# Patient Record
Sex: Female | Born: 1985
Health system: Southern US, Community
[De-identification: ages and names within clinical notes are randomized; demographics above are authoritative.]

## PROBLEM LIST (undated history)

## (undated) ENCOUNTER — Inpatient Hospital Stay (HOSPITAL_COMMUNITY): Payer: Self-pay

## (undated) DIAGNOSIS — R51 Headache: Secondary | ICD-10-CM

## (undated) DIAGNOSIS — F319 Bipolar disorder, unspecified: Secondary | ICD-10-CM

## (undated) DIAGNOSIS — D649 Anemia, unspecified: Secondary | ICD-10-CM

## (undated) DIAGNOSIS — M419 Scoliosis, unspecified: Secondary | ICD-10-CM

## (undated) DIAGNOSIS — M797 Fibromyalgia: Secondary | ICD-10-CM

## (undated) DIAGNOSIS — F32A Depression, unspecified: Secondary | ICD-10-CM

## (undated) DIAGNOSIS — F329 Major depressive disorder, single episode, unspecified: Secondary | ICD-10-CM

## (undated) DIAGNOSIS — M549 Dorsalgia, unspecified: Secondary | ICD-10-CM

## (undated) DIAGNOSIS — M069 Rheumatoid arthritis, unspecified: Secondary | ICD-10-CM

## (undated) DIAGNOSIS — I10 Essential (primary) hypertension: Secondary | ICD-10-CM

## (undated) DIAGNOSIS — F419 Anxiety disorder, unspecified: Secondary | ICD-10-CM

## (undated) DIAGNOSIS — G8929 Other chronic pain: Secondary | ICD-10-CM

## (undated) DIAGNOSIS — M25579 Pain in unspecified ankle and joints of unspecified foot: Secondary | ICD-10-CM

## (undated) DIAGNOSIS — R519 Headache, unspecified: Secondary | ICD-10-CM

## (undated) DIAGNOSIS — M25569 Pain in unspecified knee: Secondary | ICD-10-CM

## (undated) HISTORY — DX: Fibromyalgia: M79.7

## (undated) HISTORY — DX: Rheumatoid arthritis, unspecified: M06.9

## (undated) HISTORY — PX: MOUTH SURGERY: SHX715

---
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
## 2006-01-25 ENCOUNTER — Other Ambulatory Visit: Admission: RE | Admit: 2006-01-25 | Discharge: 2006-01-25 | Payer: Self-pay | Admitting: Gynecology

## 2007-03-30 ENCOUNTER — Other Ambulatory Visit: Admission: RE | Admit: 2007-03-30 | Discharge: 2007-03-30 | Payer: Self-pay | Admitting: Gynecology

## 2007-07-14 ENCOUNTER — Ambulatory Visit (HOSPITAL_COMMUNITY): Admission: RE | Admit: 2007-07-14 | Discharge: 2007-07-14 | Payer: Self-pay | Admitting: Family Medicine

## 2008-07-16 ENCOUNTER — Other Ambulatory Visit: Admission: RE | Admit: 2008-07-16 | Discharge: 2008-07-16 | Payer: Self-pay | Admitting: Obstetrics & Gynecology

## 2008-10-06 ENCOUNTER — Emergency Department (HOSPITAL_COMMUNITY): Admission: EM | Admit: 2008-10-06 | Discharge: 2008-10-06 | Payer: Self-pay | Admitting: Emergency Medicine

## 2008-10-17 ENCOUNTER — Other Ambulatory Visit: Payer: Self-pay | Admitting: Emergency Medicine

## 2008-10-17 ENCOUNTER — Ambulatory Visit: Payer: Self-pay | Admitting: Family

## 2008-10-17 ENCOUNTER — Other Ambulatory Visit: Payer: Self-pay

## 2008-10-17 ENCOUNTER — Observation Stay (HOSPITAL_COMMUNITY): Admission: AD | Admit: 2008-10-17 | Discharge: 2008-10-18 | Payer: Self-pay | Admitting: Obstetrics & Gynecology

## 2008-10-31 ENCOUNTER — Emergency Department (HOSPITAL_COMMUNITY): Admission: EM | Admit: 2008-10-31 | Discharge: 2008-10-31 | Payer: Self-pay | Admitting: Emergency Medicine

## 2008-11-29 ENCOUNTER — Other Ambulatory Visit: Payer: Self-pay | Admitting: Emergency Medicine

## 2008-11-29 ENCOUNTER — Inpatient Hospital Stay (HOSPITAL_COMMUNITY): Admission: AD | Admit: 2008-11-29 | Discharge: 2008-12-02 | Payer: Self-pay | Admitting: Obstetrics & Gynecology

## 2009-01-06 ENCOUNTER — Ambulatory Visit: Payer: Self-pay | Admitting: Obstetrics and Gynecology

## 2009-01-06 ENCOUNTER — Encounter: Payer: Self-pay | Admitting: Obstetrics & Gynecology

## 2009-01-06 ENCOUNTER — Inpatient Hospital Stay (HOSPITAL_COMMUNITY): Admission: AD | Admit: 2009-01-06 | Discharge: 2009-01-08 | Payer: Self-pay | Admitting: Obstetrics & Gynecology

## 2009-01-06 DIAGNOSIS — O42919 Preterm premature rupture of membranes, unspecified as to length of time between rupture and onset of labor, unspecified trimester: Secondary | ICD-10-CM

## 2009-04-27 ENCOUNTER — Emergency Department (HOSPITAL_COMMUNITY): Admission: EM | Admit: 2009-04-27 | Discharge: 2009-04-27 | Payer: Self-pay | Admitting: Emergency Medicine

## 2009-05-13 ENCOUNTER — Emergency Department (HOSPITAL_COMMUNITY): Admission: EM | Admit: 2009-05-13 | Discharge: 2009-05-13 | Payer: Self-pay | Admitting: Emergency Medicine

## 2010-05-31 ENCOUNTER — Encounter: Payer: Self-pay | Admitting: Family Medicine

## 2010-07-21 ENCOUNTER — Emergency Department (HOSPITAL_COMMUNITY)
Admission: EM | Admit: 2010-07-21 | Discharge: 2010-07-21 | Disposition: A | Discharge status: Home / Self Care | Payer: 59 | Attending: Emergency Medicine | Admitting: Emergency Medicine

## 2010-07-21 DIAGNOSIS — H53149 Visual discomfort, unspecified: Secondary | ICD-10-CM | POA: Insufficient documentation

## 2010-07-21 DIAGNOSIS — R51 Headache: Secondary | ICD-10-CM | POA: Insufficient documentation

## 2010-07-21 DIAGNOSIS — R109 Unspecified abdominal pain: Secondary | ICD-10-CM | POA: Insufficient documentation

## 2010-07-21 DIAGNOSIS — R112 Nausea with vomiting, unspecified: Secondary | ICD-10-CM | POA: Insufficient documentation

## 2010-07-21 DIAGNOSIS — R5381 Other malaise: Secondary | ICD-10-CM | POA: Insufficient documentation

## 2010-07-26 LAB — URINALYSIS, ROUTINE W REFLEX MICROSCOPIC
Bilirubin Urine: NEGATIVE
Ketones, ur: NEGATIVE mg/dL
Nitrite: NEGATIVE
Specific Gravity, Urine: >1.03 — ABNORMAL HIGH (ref 1.005–1.030)
Urobilinogen, UA: 0.2 mg/dL (ref 0.0–1.0)

## 2010-07-26 LAB — COMPREHENSIVE METABOLIC PANEL
ALT: 20 U/L (ref 0–35)
AST: 23 U/L (ref 0–37)
CO2: 22 mEq/L (ref 19–32)
Chloride: 103 mEq/L (ref 96–112)
Creatinine, Ser: 0.81 mg/dL (ref 0.4–1.2)
GFR calc Af Amer: >60 mL/min (ref >60)
GFR calc non Af Amer: >60 mL/min (ref >60)
Glucose, Bld: 137 mg/dL — ABNORMAL HIGH (ref 70–99)
Sodium: 135 mEq/L (ref 135–145)
Total Bilirubin: 1.1 mg/dL (ref 0.3–1.2)

## 2010-07-26 LAB — URINE MICROSCOPIC-ADD ON

## 2010-07-26 LAB — DIFFERENTIAL
Basophils Absolute: 0 10*3/uL (ref 0.0–0.1)
Basophils Relative: 0 % (ref 0–1)
Eosinophils Absolute: 0 10*3/uL (ref 0.0–0.7)
Eosinophils Relative: 0 % (ref 0–5)
Lymphocytes Relative: 2 % — ABNORMAL LOW (ref 12–46)
Lymphs Abs: 0.4 10*3/uL — ABNORMAL LOW (ref 0.7–4.0)
Monocytes Absolute: 0.4 10*3/uL (ref 0.1–1.0)
Monocytes Relative: 3 % (ref 3–12)
Neutro Abs: 14.7 10*3/uL — ABNORMAL HIGH (ref 1.7–7.7)
Neutrophils Relative %: 95 % — ABNORMAL HIGH (ref 43–77)

## 2010-07-26 LAB — CBC
Hemoglobin: 15.6 g/dL — ABNORMAL HIGH (ref 12.0–15.0)
MCHC: 34.3 g/dL (ref 30.0–36.0)
MCV: 88.6 fL (ref 78.0–100.0)
RBC: 5.13 MIL/uL — ABNORMAL HIGH (ref 3.87–5.11)
WBC: 15.5 10*3/uL — ABNORMAL HIGH (ref 4.0–10.5)

## 2010-07-26 LAB — PREGNANCY, URINE: Preg Test, Ur: NEGATIVE

## 2010-08-15 LAB — CBC
Platelets: 221 10*3/uL (ref 150–400)
RBC: 3.87 MIL/uL (ref 3.87–5.11)
WBC: 12.2 10*3/uL — ABNORMAL HIGH (ref 4.0–10.5)

## 2010-08-15 LAB — RPR: RPR Ser Ql: NONREACTIVE

## 2010-08-16 LAB — BASIC METABOLIC PANEL
BUN: 1 mg/dL — ABNORMAL LOW (ref 6–23)
BUN: 4 mg/dL — ABNORMAL LOW (ref 6–23)
CO2: 26 mEq/L (ref 19–32)
Chloride: 103 mEq/L (ref 96–112)
Creatinine, Ser: 0.45 mg/dL (ref 0.4–1.2)
GFR calc non Af Amer: >60 mL/min (ref >60)
Glucose, Bld: 100 mg/dL — ABNORMAL HIGH (ref 70–99)
Glucose, Bld: 90 mg/dL (ref 70–99)
Potassium: 3.4 mEq/L — ABNORMAL LOW (ref 3.5–5.1)

## 2010-08-16 LAB — DIFFERENTIAL
Basophils Absolute: 0 10*3/uL (ref 0.0–0.1)
Basophils Relative: 0 % (ref 0–1)
Eosinophils Absolute: 0 10*3/uL (ref 0.0–0.7)
Eosinophils Relative: 0 % (ref 0–5)
Lymphocytes Relative: 11 % — ABNORMAL LOW (ref 12–46)
Lymphs Abs: 1.1 K/uL (ref 0.7–4.0)
Monocytes Absolute: 0.8 K/uL (ref 0.1–1.0)
Monocytes Relative: 8 % (ref 3–12)
Neutro Abs: 7.9 K/uL — ABNORMAL HIGH (ref 1.7–7.7)
Neutrophils Relative %: 81 % — ABNORMAL HIGH (ref 43–77)

## 2010-08-16 LAB — URINE CULTURE: Colony Count: >=100000

## 2010-08-16 LAB — CULTURE, BLOOD (ROUTINE X 2)
Culture: NO GROWTH
Culture: NO GROWTH
Report Status: 7282010
Report Status: 7282010

## 2010-08-16 LAB — CBC
HCT: 28.4 % — ABNORMAL LOW (ref 36.0–46.0)
Hemoglobin: 10.2 g/dL — ABNORMAL LOW (ref 12.0–15.0)
MCHC: 35.5 g/dL (ref 30.0–36.0)
MCHC: 36 g/dL (ref 30.0–36.0)
MCV: 91.9 fL (ref 78.0–100.0)
MCV: 94.2 fL (ref 78.0–100.0)
Platelets: 240 10*3/uL (ref 150–400)
Platelets: 251 10*3/uL (ref 150–400)
RBC: 3.09 MIL/uL — ABNORMAL LOW (ref 3.87–5.11)
RDW: 12.2 % (ref 11.5–15.5)
WBC: 9.8 K/uL (ref 4.0–10.5)

## 2010-08-16 LAB — URINALYSIS, ROUTINE W REFLEX MICROSCOPIC
Bilirubin Urine: NEGATIVE
Glucose, UA: NEGATIVE mg/dL
Ketones, ur: NEGATIVE mg/dL
Nitrite: NEGATIVE
Protein, ur: NEGATIVE mg/dL
Specific Gravity, Urine: <1.005 — ABNORMAL LOW (ref 1.005–1.030)
Urobilinogen, UA: 0.2 mg/dL (ref 0.0–1.0)
pH: 6 (ref 5.0–8.0)

## 2010-08-16 LAB — BASIC METABOLIC PANEL WITH GFR
CO2: 22 meq/L (ref 19–32)
Calcium: 8.3 mg/dL — ABNORMAL LOW (ref 8.4–10.5)
Chloride: 103 meq/L (ref 96–112)
Creatinine, Ser: 0.47 mg/dL (ref 0.4–1.2)
GFR calc Af Amer: >60 mL/min (ref >60)
Sodium: 132 meq/L — ABNORMAL LOW (ref 135–145)

## 2010-08-16 LAB — URINE MICROSCOPIC-ADD ON

## 2010-08-17 LAB — URINALYSIS, ROUTINE W REFLEX MICROSCOPIC
Bilirubin Urine: NEGATIVE
Glucose, UA: NEGATIVE mg/dL
Hgb urine dipstick: NEGATIVE
Ketones, ur: NEGATIVE mg/dL
Ketones, ur: NEGATIVE mg/dL
Nitrite: NEGATIVE
Protein, ur: NEGATIVE mg/dL
Specific Gravity, Urine: 1.02 (ref 1.005–1.030)
Specific Gravity, Urine: 1.025 (ref 1.005–1.030)
pH: 6 (ref 5.0–8.0)
pH: 6 (ref 5.0–8.0)

## 2010-08-17 LAB — BASIC METABOLIC PANEL
BUN: 4 mg/dL — ABNORMAL LOW (ref 6–23)
CO2: 24 mEq/L (ref 19–32)
Chloride: 108 mEq/L (ref 96–112)
Creatinine, Ser: 0.38 mg/dL — ABNORMAL LOW (ref 0.4–1.2)
GFR calc Af Amer: >60 mL/min (ref >60)
Potassium: 3.6 mEq/L (ref 3.5–5.1)

## 2010-08-17 LAB — URINE CULTURE: Colony Count: >=100000

## 2010-08-17 LAB — COMPREHENSIVE METABOLIC PANEL
AST: 16 U/L (ref 0–37)
Albumin: 3 g/dL — ABNORMAL LOW (ref 3.5–5.2)
Alkaline Phosphatase: 56 U/L (ref 39–117)
BUN: 3 mg/dL — ABNORMAL LOW (ref 6–23)
GFR calc Af Amer: >60 mL/min (ref >60)
Potassium: 3.6 mEq/L (ref 3.5–5.1)
Total Protein: 5.7 g/dL — ABNORMAL LOW (ref 6.0–8.3)

## 2010-08-17 LAB — CBC
HCT: 33.2 % — ABNORMAL LOW (ref 36.0–46.0)
HCT: 29.1 % — ABNORMAL LOW (ref 36.0–46.0)
Hemoglobin: 11.9 g/dL — ABNORMAL LOW (ref 12.0–15.0)
MCHC: 35.8 g/dL (ref 30.0–36.0)
Platelets: 190 10*3/uL (ref 150–400)
RBC: 3.58 MIL/uL — ABNORMAL LOW (ref 3.87–5.11)
RDW: 13.2 % (ref 11.5–15.5)

## 2010-08-17 LAB — DIFFERENTIAL
Basophils Relative: 0 % (ref 0–1)
Eosinophils Relative: 0 % (ref 0–5)
Lymphocytes Relative: 8 % — ABNORMAL LOW (ref 12–46)
Lymphocytes Relative: 13 % (ref 12–46)
Monocytes Absolute: 0.5 10*3/uL (ref 0.1–1.0)
Monocytes Absolute: 0.8 10*3/uL (ref 0.1–1.0)
Monocytes Relative: 3 % (ref 3–12)
Monocytes Relative: 6 % (ref 3–12)
Neutro Abs: 14.5 10*3/uL — ABNORMAL HIGH (ref 1.7–7.7)
Neutro Abs: 10.6 10*3/uL — ABNORMAL HIGH (ref 1.7–7.7)

## 2010-08-17 LAB — URINE MICROSCOPIC-ADD ON

## 2010-08-18 LAB — DIFFERENTIAL
Basophils Absolute: 0 10*3/uL (ref 0.0–0.1)
Eosinophils Absolute: 0.1 10*3/uL (ref 0.0–0.7)
Lymphocytes Relative: 12 % (ref 12–46)
Lymphs Abs: 1.3 10*3/uL (ref 0.7–4.0)
Neutrophils Relative %: 82 % — ABNORMAL HIGH (ref 43–77)

## 2010-08-18 LAB — PREGNANCY, URINE: Preg Test, Ur: POSITIVE

## 2010-08-18 LAB — URINALYSIS, ROUTINE W REFLEX MICROSCOPIC
Bilirubin Urine: NEGATIVE
Ketones, ur: NEGATIVE mg/dL
Nitrite: POSITIVE — AB
Urobilinogen, UA: 0.2 mg/dL (ref 0.0–1.0)

## 2010-08-18 LAB — CBC
Hemoglobin: 10.9 g/dL — ABNORMAL LOW (ref 12.0–15.0)
MCHC: 36.1 g/dL — ABNORMAL HIGH (ref 30.0–36.0)
MCV: 92.3 fL (ref 78.0–100.0)
RBC: 3.27 MIL/uL — ABNORMAL LOW (ref 3.87–5.11)

## 2010-08-18 LAB — COMPREHENSIVE METABOLIC PANEL
ALT: 11 U/L (ref 0–35)
CO2: 24 mEq/L (ref 19–32)
Calcium: 8.5 mg/dL (ref 8.4–10.5)
Creatinine, Ser: 0.44 mg/dL (ref 0.4–1.2)
GFR calc non Af Amer: >60 mL/min (ref >60)
Glucose, Bld: 90 mg/dL (ref 70–99)

## 2010-09-22 NOTE — Discharge Summary (Signed)
NAMESHEIKA, Brianna Hughes                ACCOUNT NO.:  [id number]   MEDICAL RECORD NO.:  [id number]          PATIENT TYPE:  INP   LOCATION:  9157                          FACILITY:  [facility]   PHYSICIAN:  Brianna Hughes, M.D.   DATE OF BIRTH:  Jul 23, 1985   DATE OF ADMISSION:  11/29/2008  DATE OF DISCHARGE:  12/02/2008                               DISCHARGE SUMMARY   ADMITTING DIAGNOSIS:  Pregnancy at 30 weeks with right pyelonephritis.   HISTORY OF PRESENT ILLNESS:  Brianna Hughes is a 25 year old gravida 1 at 90  weeks who was seen at [facility] ER and noted to have a  temperature of 102 with right CVA tenderness.  At that time, urine was  collected, the patient placed on antibiotics and transferred down to  [facility] to our care.  The patient responded well to IV Rocephin.  She has been on afebrile.  Since her first day, she had a spike of  100.2, but she has been afebrile and responded well to antibiotic  therapy.  Sensitivity showed that she grew out E. Coli, which is  sensitive to Keflex, which we are going to place her on at discharge.   DISCHARGE DIAGNOSIS:  Pregnancy at 30.2 weeks with right pyelonephritis,  resolved.   DISCHARGE MEDICATIONS:  Keflex 500 p.o. t.i.d. for 10 days, then she is  to start Macrobid 100 mg p.o. nightly.  She is to continue that  throughout the rest of her pregnancy.   FOLLOWUP:  At [facility] on Thursday or Friday this week, unless she  has any problems at that time she is to call and notify them.      Brianna Hughes, N.M.      Brianna Hughes, M.D.  Electronically Signed    DL/MEDQ  D:  16/02/9603  T:  12/02/2008  Job:  [id number]   cc:   [facility]

## 2010-09-22 NOTE — Discharge Summary (Signed)
[name], [name]                ACCOUNT NO.:  [id number]   MEDICAL RECORD NO.:  [id number]          PATIENT TYPE:  INP   LOCATION:  9149                          FACILITY:  [facility]   PHYSICIAN:  Scheryl Darter, MD       DATE OF BIRTH:  January 07, 1986   DATE OF ADMISSION:  10/17/2008  DATE OF DISCHARGE:  10/18/2008                               DISCHARGE SUMMARY   DISCHARGE DIAGNOSES:  1. Acute right pyelonephritis.  2. Chronic lumbar and thoracic back pain.   REASON FOR ADMISSION:  [name] is a 25 year old primigravida  patient admitted from [facility] with a presumed  pyelonephritis.  She had approximately 2 weeks ago when treated for UTI  that seems to get better, and on the day admission, had worsening  urinary symptoms as well as nausea and vomiting and right flank pain  that radiates to the right lower abdomen.  Her urinalysis on evaluation  revealed positive nitrites, large leukocyte esterase, moderate  hemoglobin as well as a microscopic which showed 21-50 white cells and 7-  10 red cells.  A urine culture is pending at time of discharge.  Additionally, the patient has been complaining of back pain as well.  However, on review the patient's record, this is a chronic complaint  that she has had for several years.   HOSPITAL COURSE:  The patient was admitted and started on IV Rocephin.  She was afebrile during her entire hospital stay.  Her urinary symptoms  and nausea and vomiting has improved, and she is eating and drinking  quite well and tolerating all her intake.  On further evaluation, it is  noted that she has a lot of musculoskeletal tenderness of her lower and  mid thoracic area as well as the lower lumbar area.  Upon discussion  with the patient, this is not new pain that has been present for quite  some time, it has worsened during the pregnancy, however.  She required  several doses of IV pain medicine for this pain.  She had an ultrasound  performed  during her hospitalization which showed mild right  hydronephrosis with good ureteral jets.   MEDICATIONS AT DISCHARGE:  1. Keflex 500 mg 4 times a day for additional 12 more days.  2. Percocet 1-2 pills every 6 hours for severe pain, 28 pills given.   INSTRUCTION FOR THE PATIENT:  The patient was instructed on use of the  above medications as well as importance of good hydration, additionally  I had a long talk with the patient about her back pain saying that  exercise, stretching, Pilates, yoga, swimming are all good treatments  for her back pain and that her back pain will definitely worse during  this pregnancy unless she becomes more active and more proactive of her  back pain.  She will continue to have this pain.  I would recommend to  her that nonnarcotic pain medications are not a good way to treat it,  however, non-medicinal options as stated above are the best way is to  take care of her musculoskeletal  back pain.  The patient was also  instructed to follow up in [facility] next week.  She will call for an  appointment today after her discharge.  The patient's questions were  answered as well as her mother's and the patient voiced understanding of  instructions.   CONDITION ON DISCHARGE:  Good.   DISPOSITION:  Discharged to home.  Follow up with [facility] in several  days.      Odie Sera, DO      Scheryl Darter, MD  Electronically Signed    MC/MEDQ  D:  10/18/2008  T:  10/18/2008  Job:  [id number]

## 2010-10-15 ENCOUNTER — Emergency Department (HOSPITAL_COMMUNITY)
Admission: EM | Admit: 2010-10-15 | Discharge: 2010-10-16 | Disposition: A | Discharge status: Home / Self Care | Payer: 59 | Attending: Emergency Medicine | Admitting: Emergency Medicine

## 2010-10-15 DIAGNOSIS — R569 Unspecified convulsions: Secondary | ICD-10-CM | POA: Insufficient documentation

## 2010-10-15 DIAGNOSIS — R51 Headache: Secondary | ICD-10-CM | POA: Insufficient documentation

## 2010-10-15 DIAGNOSIS — G8929 Other chronic pain: Secondary | ICD-10-CM | POA: Insufficient documentation

## 2010-10-16 ENCOUNTER — Emergency Department (HOSPITAL_COMMUNITY): Payer: 59

## 2010-10-16 LAB — CBC
HCT: 38.6 % (ref 36.0–46.0)
Platelets: 252 10*3/uL (ref 150–400)
RDW: 12.4 % (ref 11.5–15.5)
WBC: 9.1 10*3/uL (ref 4.0–10.5)

## 2010-10-16 LAB — URINALYSIS, ROUTINE W REFLEX MICROSCOPIC
Nitrite: NEGATIVE
Protein, ur: NEGATIVE mg/dL
Specific Gravity, Urine: >1.03 — ABNORMAL HIGH (ref 1.005–1.030)
Urobilinogen, UA: 0.2 mg/dL (ref 0.0–1.0)

## 2010-10-16 LAB — DIFFERENTIAL
Basophils Absolute: 0 10*3/uL (ref 0.0–0.1)
Basophils Relative: 0 % (ref 0–1)
Eosinophils Absolute: 0 10*3/uL (ref 0.0–0.7)
Eosinophils Relative: 0 % (ref 0–5)
Lymphocytes Relative: 16 % (ref 12–46)

## 2010-10-16 LAB — BASIC METABOLIC PANEL
CO2: 24 mEq/L (ref 19–32)
GFR calc Af Amer: >60 mL/min (ref >60)
Glucose, Bld: 92 mg/dL (ref 70–99)
Potassium: 3.9 mEq/L (ref 3.5–5.1)
Sodium: 134 mEq/L — ABNORMAL LOW (ref 135–145)

## 2010-10-16 LAB — RAPID URINE DRUG SCREEN, HOSP PERFORMED: Barbiturates: NOT DETECTED

## 2010-10-16 LAB — GLUCOSE, CAPILLARY: Glucose-Capillary: 93 mg/dL (ref 70–99)

## 2010-10-16 LAB — PREGNANCY, URINE: Preg Test, Ur: NEGATIVE

## 2010-10-17 LAB — URINE CULTURE
Colony Count: NO GROWTH
Culture  Setup Time: 201206082124

## 2010-10-22 ENCOUNTER — Ambulatory Visit (HOSPITAL_COMMUNITY): Payer: 59

## 2011-02-23 LAB — OB RESULTS CONSOLE ANTIBODY SCREEN: Antibody Screen: NEGATIVE

## 2011-02-23 LAB — OB RESULTS CONSOLE ABO/RH: RH Type: POSITIVE

## 2011-02-23 LAB — OB RESULTS CONSOLE GC/CHLAMYDIA: Chlamydia: NEGATIVE

## 2011-02-23 LAB — OB RESULTS CONSOLE HEPATITIS B SURFACE ANTIGEN: Hepatitis B Surface Ag: NEGATIVE

## 2011-02-24 ENCOUNTER — Other Ambulatory Visit: Payer: Self-pay | Admitting: Adult Health

## 2011-02-24 ENCOUNTER — Other Ambulatory Visit (HOSPITAL_COMMUNITY)
Admission: RE | Admit: 2011-02-24 | Discharge: 2011-02-24 | Disposition: A | Discharge status: Home / Self Care | Payer: 59 | Source: Ambulatory Visit | Attending: Obstetrics and Gynecology | Admitting: Obstetrics and Gynecology

## 2011-02-24 DIAGNOSIS — Z113 Encounter for screening for infections with a predominantly sexual mode of transmission: Secondary | ICD-10-CM | POA: Insufficient documentation

## 2011-02-24 DIAGNOSIS — R87810 Cervical high risk human papillomavirus (HPV) DNA test positive: Secondary | ICD-10-CM | POA: Insufficient documentation

## 2011-02-24 DIAGNOSIS — Z01419 Encounter for gynecological examination (general) (routine) without abnormal findings: Secondary | ICD-10-CM | POA: Insufficient documentation

## 2011-05-11 NOTE — L&D Delivery Note (Cosign Needed)
Delivery Note At 4:46 PM a viable female was delivered via Vaginal, Spontaneous Delivery (Presentation: ; Occiput Anterior).  APGAR: 8, 9; weight pending.   Placenta status: Intact, Spontaneous.  Cord: 3 vessels with the following complications: None.   Anesthesia: Epidural  Episiotomy: None Lacerations: 1st degree;Labial Suture Repair: 3.0 monocryl Est. Blood Loss (mL):   Mom to postpartum.  Baby to nursery-stable.  Supervised by D. Melanee Left, Maymunah Stegemann 10/13/2011, 5:06 PM

## 2011-05-20 ENCOUNTER — Encounter (HOSPITAL_COMMUNITY): Payer: Self-pay | Admitting: Emergency Medicine

## 2011-05-20 ENCOUNTER — Emergency Department (HOSPITAL_COMMUNITY)
Admission: EM | Admit: 2011-05-20 | Discharge: 2011-05-20 | Disposition: A | Discharge status: Home / Self Care | Payer: Medicaid Other | Attending: Emergency Medicine | Admitting: Emergency Medicine

## 2011-05-20 DIAGNOSIS — O2300 Infections of kidney in pregnancy, unspecified trimester: Secondary | ICD-10-CM

## 2011-05-20 DIAGNOSIS — O2390 Unspecified genitourinary tract infection in pregnancy, unspecified trimester: Secondary | ICD-10-CM | POA: Insufficient documentation

## 2011-05-20 DIAGNOSIS — F17200 Nicotine dependence, unspecified, uncomplicated: Secondary | ICD-10-CM | POA: Insufficient documentation

## 2011-05-20 DIAGNOSIS — N12 Tubulo-interstitial nephritis, not specified as acute or chronic: Secondary | ICD-10-CM | POA: Insufficient documentation

## 2011-05-20 LAB — URINE CULTURE
Colony Count: >=100000
Culture  Setup Time: 201301111338

## 2011-05-20 LAB — URINALYSIS, ROUTINE W REFLEX MICROSCOPIC
Bilirubin Urine: NEGATIVE
Ketones, ur: NEGATIVE mg/dL
Nitrite: NEGATIVE
Protein, ur: NEGATIVE mg/dL
Urobilinogen, UA: 0.2 mg/dL (ref 0.0–1.0)
pH: 7 (ref 5.0–8.0)

## 2011-05-20 LAB — BASIC METABOLIC PANEL
BUN: 8 mg/dL (ref 6–23)
Creatinine, Ser: 0.46 mg/dL — ABNORMAL LOW (ref 0.50–1.10)
GFR calc Af Amer: >90 mL/min (ref >90)
GFR calc non Af Amer: >90 mL/min (ref >90)
Glucose, Bld: 91 mg/dL (ref 70–99)
Potassium: 4.1 mEq/L (ref 3.5–5.1)

## 2011-05-20 LAB — URINE MICROSCOPIC-ADD ON

## 2011-05-20 MED ORDER — ONDANSETRON HCL 4 MG/2ML IJ SOLN
4.0000 mg | Freq: Once | INTRAMUSCULAR | Status: AC
Start: 2011-05-20 — End: 2011-05-20
  Administered 2011-05-20: 4 mg via INTRAVENOUS
  Filled 2011-05-20: qty 2

## 2011-05-20 MED ORDER — ONDANSETRON HCL 4 MG PO TABS: 4.0000 mg | ORAL_TABLET | Freq: Four times a day (QID) | ORAL | Status: DC

## 2011-05-20 MED ORDER — SODIUM CHLORIDE 0.9 % IV BOLUS (SEPSIS)
1000.0000 mL | Freq: Once | INTRAVENOUS | Status: AC
  Administered 2011-05-20: 1000 mL via INTRAVENOUS

## 2011-05-20 MED ORDER — CEPHALEXIN 500 MG PO CAPS: 500.0000 mg | ORAL_CAPSULE | Freq: Four times a day (QID) | ORAL | Status: AC

## 2011-05-20 MED ORDER — DEXTROSE 5 % IV SOLN
1.0000 g | Freq: Once | INTRAVENOUS | Status: AC
  Administered 2011-05-20: 1 g via INTRAVENOUS
  Filled 2011-05-20: qty 10

## 2011-05-20 NOTE — ED Provider Notes (Signed)
History     CSN: [id number]  Arrival date & time 05/20/11  1624   First MD Initiated Contact with Patient 05/20/11 1912      Chief Complaint  Patient presents with  . Flank Pain    (Consider location/radiation/quality/duration/timing/severity/associated sxs/prior treatment) HPI Patient presents with bilateral flank pain over the past 4-5 days. Pain is described as dull. She was treated with Bactrim starting 2 weeks ago for cystitis. She took 6 doses of the medication but did not complete the course. She's had no fevers or chills. The urinary symptoms began 2 weeks ago and flank pain began 4-5 days ago. She has had nausea with occasional emesis over the past 4 days. She has been able to keep down liquids without difficulty. She is G3 P2. She is approximately [redacted] weeks pregnant. She denies any abdominal pain or cramping and has no vaginal bleeding. There no other alleviating or modifying factors. Pain has been constant. There no other associated systemic symptoms.  History reviewed. No pertinent past medical history. Pregnant, kidney infections during pregnancy History reviewed. No pertinent past surgical history.  History reviewed. No pertinent family history.  History  Substance Use Topics  . Smoking status: Current Everyday Smoker  . Smokeless tobacco: Not on file  . Alcohol Use: No    OB History    Grav Para Term Preterm Abortions TAB SAB Ect Mult Living   2 1        1       Review of Systems ROS reviewed and otherwise negative except for mentioned in HPI  Allergies  Review of patient's allergies indicates no known allergies.  Home Medications  Home meds reviewed-  Subutex, zofran, prenatal vitamins, "an injection that prolongs pregnancy 17P"  BP 212/83  Pulse 78  Temp(Src) 98.6 F (37 C) (Oral)  Resp 24  Ht 5\' 4"  (1.626 m)  Wt 128 lb (58.06 kg)  BMI 21.97 kg/m2  SpO2 96%  LMP 12/09/2010 Vitals reviewed Physical Exam Physical Examination: General appearance  - alert, well appearing, and in no distress Mental status - alert, oriented to person, place, and time Mouth - mucous membranes moist, pharynx normal without lesions Chest - clear to auscultation, no wheezes, rales or rhonchi, symmetric air entry Heart - normal rate, regular rhythm, normal S1, S2, no murmurs, rubs, clicks or gallops Abdomen - soft, nontender, no masses or organomegaly, gravid Back exam - full range of motion, no palpable spasm or pain on motion, mild CVA tenderness bilaterally Musculoskeletal - no joint tenderness, deformity or swelling Extremities - peripheral pulses normal, no pedal edema, no clubbing or cyanosis Skin - normal coloration and turgor, no rashes  ED Course  Procedures (including critical care time)  Fetal heart tones checked and were 154  8:45 PM d/w Dr. Emelda Fear who recommends treating with rocephin, then keflex and patient should follow up in the office.  Urine culture sent and prior cultures and urinalysis results reviewed.    9:07 PM pt updated about findings and plan.  She will call OB office tomorrow.   Labs Reviewed  URINALYSIS, ROUTINE W REFLEX MICROSCOPIC - Abnormal; Notable for the following:    Color, Urine AMBER (*) BIOCHEMICALS MAY BE AFFECTED BY COLOR   APPearance CLOUDY (*)    Leukocytes, UA SMALL (*)    All other components within normal limits  BASIC METABOLIC PANEL - Abnormal; Notable for the following:    Sodium 134 (*)    Creatinine, Ser 0.46 (*)    All other  components within normal limits  URINE MICROSCOPIC-ADD ON - Abnormal; Notable for the following:    Squamous Epithelial / LPF MANY (*)    Bacteria, UA MANY (*)    All other components within normal limits  PREGNANCY, URINE  URINE CULTURE   No results found.   1. Pyelonephritis complicating pregnancy       MDM  Patient approximately [redacted] weeks gestation presenting with low back pain and prior treatment for cystitis 2 weeks ago with Bactrim which she did not complete.  She also feels she is urinating more frequently. Urinalysis reveals small leukocyte esterase many squamous epithelial cells many bacteria trace leukocytes and no nitrite. Due to the equivocal results a urine culture was sent as well as for consultation obtained with Dr. Emelda Fear. He recommends Rocephin IV in the emergency department and discharging patient with a course of Keflex. Patient will call the office tomorrow to arrange for close followup. She is tolerating fluids in the ED without difficulty. She is nontoxic and well-hydrated appearing. She continues to feel fetal movement has no abdominal pain or vaginal bleeding and fetal heart tones are 154. She was given strict return precautions and is agreeable with the plan.        Ethelda Chick, MD 05/20/11 2123

## 2011-05-20 NOTE — ED Notes (Signed)
Pt reports being seen here for bladder infection 2 weeks ago. Pt non compliant with medication, pt now having worse bilateral flank pain. Pt reports that she is 5 months pregnant, fetal heart tones 153 via doppler. Pt reports that she has been urinating more than usual. Pt also reports some nausea and vomiting.

## 2011-05-20 NOTE — ED Notes (Addendum)
Pt c/o bilateral flank pain and polyuria. Some n/v.  Pt [redacted] weeks pregnant.

## 2011-05-25 ENCOUNTER — Inpatient Hospital Stay (HOSPITAL_COMMUNITY)
Admission: EM | Admit: 2011-05-25 | Discharge: 2011-05-28 | DRG: 781 | Disposition: A | Discharge status: Home / Self Care | Payer: Medicaid Other | Attending: Obstetrics & Gynecology | Admitting: Obstetrics & Gynecology

## 2011-05-25 ENCOUNTER — Other Ambulatory Visit: Payer: Self-pay | Admitting: Obstetrics & Gynecology

## 2011-05-25 ENCOUNTER — Encounter (HOSPITAL_COMMUNITY): Payer: Self-pay | Admitting: *Deleted

## 2011-05-25 DIAGNOSIS — O2390 Unspecified genitourinary tract infection in pregnancy, unspecified trimester: Principal | ICD-10-CM | POA: Diagnosis present

## 2011-05-25 DIAGNOSIS — N12 Tubulo-interstitial nephritis, not specified as acute or chronic: Secondary | ICD-10-CM

## 2011-05-25 HISTORY — DX: Scoliosis, unspecified: M41.9

## 2011-05-25 LAB — BASIC METABOLIC PANEL
BUN: 9 mg/dL (ref 6–23)
Calcium: 8.6 mg/dL (ref 8.4–10.5)
Creatinine, Ser: 0.48 mg/dL — ABNORMAL LOW (ref 0.50–1.10)
GFR calc Af Amer: >90 mL/min (ref >90)
GFR calc non Af Amer: >90 mL/min (ref >90)

## 2011-05-25 LAB — DIFFERENTIAL
Basophils Absolute: 0 10*3/uL (ref 0.0–0.1)
Basophils Relative: 0 % (ref 0–1)
Eosinophils Absolute: 0 10*3/uL (ref 0.0–0.7)
Monocytes Absolute: 0.3 10*3/uL (ref 0.1–1.0)
Monocytes Relative: 2 % — ABNORMAL LOW (ref 3–12)

## 2011-05-25 LAB — URINALYSIS, ROUTINE W REFLEX MICROSCOPIC
Bilirubin Urine: NEGATIVE
Glucose, UA: NEGATIVE mg/dL
Ketones, ur: NEGATIVE mg/dL
Nitrite: NEGATIVE
pH: 6 (ref 5.0–8.0)

## 2011-05-25 LAB — CBC
HCT: 36.9 % (ref 36.0–46.0)
Hemoglobin: 12.8 g/dL (ref 12.0–15.0)
MCH: 30.8 pg (ref 26.0–34.0)
MCHC: 34.7 g/dL (ref 30.0–36.0)
RDW: 12.8 % (ref 11.5–15.5)

## 2011-05-25 LAB — URINE MICROSCOPIC-ADD ON

## 2011-05-25 LAB — URINE CULTURE: Colony Count: NO GROWTH

## 2011-05-25 MED ORDER — ONDANSETRON HCL 4 MG PO TABS
8.0000 mg | ORAL_TABLET | Freq: Four times a day (QID) | ORAL | Status: DC | PRN
  Administered 2011-05-26: 8 mg via ORAL
  Filled 2011-05-25: qty 2

## 2011-05-25 MED ORDER — ONDANSETRON HCL 4 MG/2ML IJ SOLN
4.0000 mg | Freq: Once | INTRAMUSCULAR | Status: AC
  Administered 2011-05-25: 4 mg via INTRAVENOUS
  Filled 2011-05-25: qty 2

## 2011-05-25 MED ORDER — ACETAMINOPHEN 325 MG PO TABS: 650.0000 mg | ORAL_TABLET | Freq: Four times a day (QID) | ORAL | Status: DC | PRN

## 2011-05-25 MED ORDER — DEXTROSE 5 % IV SOLN
1.0000 g | Freq: Two times a day (BID) | INTRAVENOUS | Status: DC
  Administered 2011-05-25 – 2011-05-27 (×6): 1 g via INTRAVENOUS
  Filled 2011-05-25 (×8): qty 10

## 2011-05-25 MED ORDER — ZOLPIDEM TARTRATE 5 MG PO TABS
10.0000 mg | ORAL_TABLET | Freq: Every evening | ORAL | Status: DC | PRN
  Administered 2011-05-26 – 2011-05-27 (×2): 10 mg via ORAL
  Filled 2011-05-25 (×2): qty 2

## 2011-05-25 MED ORDER — DEXTROSE 5 % IV SOLN
1.0000 g | Freq: Once | INTRAVENOUS | Status: AC
  Administered 2011-05-25: 1 g via INTRAVENOUS
  Filled 2011-05-25: qty 10

## 2011-05-25 MED ORDER — SODIUM CHLORIDE 0.9 % IV BOLUS (SEPSIS)
1000.0000 mL | Freq: Once | INTRAVENOUS | Status: AC
  Administered 2011-05-25: 1000 mL via INTRAVENOUS

## 2011-05-25 MED ORDER — ONDANSETRON 8 MG/NS 50 ML IVPB
8.0000 mg | Freq: Four times a day (QID) | INTRAVENOUS | Status: DC | PRN
  Administered 2011-05-27: 8 mg via INTRAVENOUS
  Filled 2011-05-25 (×4): qty 8

## 2011-05-25 MED ORDER — KCL IN DEXTROSE-NACL 20-5-0.45 MEQ/L-%-% IV SOLN
INTRAVENOUS | Status: DC
  Administered 2011-05-25 (×2): via INTRAVENOUS
  Administered 2011-05-27: 1000 mL via INTRAVENOUS
  Administered 2011-05-27: 01:00:00 via INTRAVENOUS

## 2011-05-25 MED ORDER — ACETAMINOPHEN 650 MG RE SUPP: 650.0000 mg | Freq: Four times a day (QID) | RECTAL | Status: DC | PRN

## 2011-05-25 MED ORDER — MORPHINE SULFATE 2 MG/ML IJ SOLN
2.0000 mg | Freq: Once | INTRAMUSCULAR | Status: AC
  Administered 2011-05-25: 2 mg via INTRAVENOUS

## 2011-05-25 MED ORDER — HYDROCODONE-ACETAMINOPHEN 5-325 MG PO TABS
1.0000 | ORAL_TABLET | ORAL | Status: DC | PRN
  Administered 2011-05-25 (×3): 2 via ORAL
  Administered 2011-05-26: 1 via ORAL
  Administered 2011-05-26 – 2011-05-28 (×10): 2 via ORAL
  Filled 2011-05-25 (×5): qty 2
  Filled 2011-05-25: qty 1
  Filled 2011-05-25 (×8): qty 2

## 2011-05-25 MED ORDER — MORPHINE SULFATE 2 MG/ML IJ SOLN
INTRAMUSCULAR | Status: AC
  Filled 2011-05-25: qty 1

## 2011-05-25 NOTE — ED Notes (Signed)
Reports upper abd pain with n/v since last night.  Pt approx 5 months preg.  EDD 10/14/2011.

## 2011-05-25 NOTE — Progress Notes (Signed)
UR Chart Review Completed  

## 2011-05-25 NOTE — H&P (Signed)
HPI  Brianna Hughes is a 26 y.o. female with a history of UTI and pyelonephritis who presents to the Emergency Department complaining of acute onset flank pain. Pt states that she has associated nausea, vomiting, and cold chills since 11:30 PM last night but denies any diarrhea. Pt is about 5 months pregnant and was seen here Thursday for a UTI and has been experiencing the pain for the past 2 weeks. She was begun on cephalexin for the UTI on Thursday.  PCP- [facility]  Past Medical History   Diagnosis  Date   .  Scoliosis    History reviewed. No pertinent past surgical history.  No family history on file.  History   Substance Use Topics   .  Smoking status:  Current Everyday Smoker     Types:  Cigarettes   .  Smokeless tobacco:  Not on file   .  Alcohol Use:  No    OB History    Grav  Para  Term  Preterm  Abortions  TAB  SAB  Ect  Mult  Living    2  1         1      Review of Systems  10 Systems reviewed and are negative for acute change except as noted in the HPI.  Allergies   Review of patient's allergies indicates no known allergies.  Home Medications   Pulse oximetry on room air is 97%. Normal by my interpretation..  BP 120/67  Pulse 99  Temp(Src) 99.1 F (37.3 C) (Oral)  Resp 16  Ht 5\' 5"  (1.651 m)  Wt 128 lb (58.06 kg)  BMI 21.30 kg/m2  SpO2 97%  LMP 12/09/2010  Physical Exam  Nursing note and vitals reviewed.  Constitutional: She is oriented to person, place, and time. She appears well-developed and well-nourished.  HENT:  Head: Normocephalic and atraumatic.  Neck: Normal range of motion. Neck supple.  Cardiovascular: Normal rate, regular rhythm and normal heart sounds.  Pulmonary/Chest: Effort normal and breath sounds normal.  Abdominal: Soft. There is no tenderness. There is CVA tenderness (bilaterally, left greater than right).  Genitourinary:  gravid  Neurological: She is alert and oriented to person, place, and time.  Skin: Skin is warm and dry.    Psychiatric: She has a normal mood and affect. Her behavior is normal.  ED Course   Procedures (including critical care time)  Medications   sodium chloride 0.9 % bolus 1,000 mL (1000 mL Intravenous Given 05/25/11 0731)   ondansetron (ZOFRAN) injection 4 mg (4 mg Intravenous Given 05/25/11 0730)    Results for orders placed during the hospital encounter of 05/25/11   URINALYSIS, ROUTINE W REFLEX MICROSCOPIC   Component  Value  Range    Color, Urine  ORANGE (*)  YELLOW    APPearance  CLEAR  CLEAR    Specific Gravity, Urine  >1.030 (*)  1.005 - 1.030    pH  6.0  5.0 - 8.0    Glucose, UA  NEGATIVE  NEGATIVE (mg/dL)    Hgb urine dipstick  NEGATIVE  NEGATIVE    Bilirubin Urine  NEGATIVE  NEGATIVE    Ketones, ur  NEGATIVE  NEGATIVE (mg/dL)    Protein, ur  TRACE  NEGATIVE (mg/dL)    Urobilinogen, UA  0.2  0.0 - 1.0 (mg/dL)    Nitrite  NEGATIVE  NEGATIVE    Leukocytes, UA  NEGATIVE  NEGATIVE   CBC   Component  Value  Range  WBC  15.7 (*)  4.0 - 10.5 (K/uL)    RBC  4.15  3.87 - 5.11 (MIL/uL)    Hemoglobin  12.8  12.0 - 15.0 (g/dL)    HCT  16.1  09.6 - 04.5 (%)    MCV  88.9  78.0 - 100.0 (fL)    MCH  30.8  26.0 - 34.0 (pg)    MCHC  34.7  30.0 - 36.0 (g/dL)    RDW  40.9  81.1 - 91.4 (%)    Platelets  199  150 - 400 (K/uL)   DIFFERENTIAL   Component  Value  Range    Neutrophils Relative  96 (*)  43 - 77 (%)    Neutro Abs  15.1 (*)  1.7 - 7.7 (K/uL)    Lymphocytes Relative  2 (*)  12 - 46 (%)    Lymphs Abs  0.3 (*)  0.7 - 4.0 (K/uL)    Monocytes Relative  2 (*)  3 - 12 (%)    Monocytes Absolute  0.3  0.1 - 1.0 (K/uL)    Eosinophils Relative  0  0 - 5 (%)    Eosinophils Absolute  0.0  0.0 - 0.7 (K/uL)    Basophils Relative  0  0 - 1 (%)    Basophils Absolute  0.0  0.0 - 0.1 (K/uL)   BASIC METABOLIC PANEL   Component  Value  Range    Sodium  137  135 - 145 (mEq/L)    Potassium  3.4 (*)  3.5 - 5.1 (mEq/L)    Chloride  105  96 - 112 (mEq/L)    CO2  25  19 - 32 (mEq/L)    Glucose,  Bld  142 (*)  70 - 99 (mg/dL)    BUN  9  6 - 23 (mg/dL)    Creatinine, Ser  7.82 (*)  0.50 - 1.10 (mg/dL)    Calcium  8.6  8.4 - 10.5 (mg/dL)    GFR calc non Af Amer  >90  >90 (mL/min)    GFR calc Af Amer  >90  >90 (mL/min)   URINE MICROSCOPIC-ADD ON   Component  Value  Range    Squamous Epithelial / LPF  MANY (*)  RARE    WBC, UA  11-20  <3 (WBC/hpf)    Bacteria, UA  FEW (*)  RARE    Urine-Other  MUCUS     MDM   Patient is G2,P1 5 months pregnant (EDC 10/14/11) here with continued flank pain, nausea, vomiting. She was treated for UTI on Thursday and has not improved. Urine still with 11-20 WBC, bilateral flank pain. She will be admitted at [facility] for pyelonephritis.The patient appears reasonably stabilized for admission considering the current resources, flow, and capabilities available in the ED at this time, and I doubt any other [facility] requiring further screening and/or treatment in the ED prior to admission.  Assessment:  IUP at 19 weeks 5 days with pyelonephritis  Admit for IV Rocephin  EURE,LUTHER H 05/25/2011 10:38 AM

## 2011-05-25 NOTE — ED Provider Notes (Signed)
This chart was scribed for [employer]. Colon Branch, MD by Williemae Natter. The patient was seen in room [facility] at 7:26 AM. .  CSN: [id number]  Arrival date & time 05/25/11  0700   First MD Initiated Contact with Patient 05/25/11 [phone]      Chief Complaint  Patient presents with  . Abdominal Pain    (Consider location/radiation/quality/duration/timing/severity/associated sxs/prior treatment) HPI Brianna Hughes is a 26 y.o. female with a history of UTI and pyelonephritis who presents to the Emergency Department complaining of acute onset flank pain. Pt states that she has associated nausea, vomiting, and cold chills since 11:30 PM last night but denies any diarrhea. Pt is about 5 months pregnant and was seen here Thursday for a UTI and has been experiencing the pain for the past 2 weeks. She was begun on cephalexin for the UTI on Thursday.  PCP- [facility] Past Medical History  Diagnosis Date  . Scoliosis     History reviewed. No pertinent past surgical history.  No family history on file.  History  Substance Use Topics  . Smoking status: Current Everyday Smoker    Types: Cigarettes  . Smokeless tobacco: Not on file  . Alcohol Use: No    OB History    Grav Para Term Preterm Abortions TAB SAB Ect Mult Living   2 1        1       Review of Systems 10 Systems reviewed and are negative for acute change except as noted in the HPI.  Allergies  Review of patient's allergies indicates no known allergies.  Home Medications   Pulse oximetry on room air is 97%. Normal by my interpretation..  BP 120/67  Pulse 99  Temp(Src) 99.1 F (37.3 C) (Oral)  Resp 16  Ht 5\' 5"  (1.651 m)  Wt 128 lb (58.06 kg)  BMI 21.30 kg/m2  SpO2 97%  LMP 12/09/2010  Physical Exam  Nursing note and vitals reviewed. Constitutional: She is oriented to person, place, and time. She appears well-developed and well-nourished.  HENT:  Head: Normocephalic and atraumatic.  Neck: Normal range of motion.  Neck supple.  Cardiovascular: Normal rate, regular rhythm and normal heart sounds.   Pulmonary/Chest: Effort normal and breath sounds normal.  Abdominal: Soft. There is no tenderness. There is CVA tenderness (bilaterally, left greater than right).  Genitourinary:       gravid  Neurological: She is alert and oriented to person, place, and time.  Skin: Skin is warm and dry.  Psychiatric: She has a normal mood and affect. Her behavior is normal.    ED Course  Procedures (including critical care time)   Medications  sodium chloride 0.9 % bolus 1,000 mL (1000 mL Intravenous Given 05/25/11 0731)  ondansetron (ZOFRAN) injection 4 mg (4 mg Intravenous Given 05/25/11 0730)   Results for orders placed during the hospital encounter of 05/25/11  URINALYSIS, ROUTINE W REFLEX MICROSCOPIC      Component Value Range   Color, Urine ORANGE (*) YELLOW    APPearance CLEAR  CLEAR    Specific Gravity, Urine >1.030 (*) 1.005 - 1.030    pH 6.0  5.0 - 8.0    Glucose, UA NEGATIVE  NEGATIVE (mg/dL)   Hgb urine dipstick NEGATIVE  NEGATIVE    Bilirubin Urine NEGATIVE  NEGATIVE    Ketones, ur NEGATIVE  NEGATIVE (mg/dL)   Protein, ur TRACE  NEGATIVE (mg/dL)   Urobilinogen, UA 0.2  0.0 - 1.0 (mg/dL)   Nitrite NEGATIVE  NEGATIVE  Leukocytes, UA NEGATIVE  NEGATIVE   CBC      Component Value Range   WBC 15.7 (*) 4.0 - 10.5 (K/uL)   RBC 4.15  3.87 - 5.11 (MIL/uL)   Hemoglobin 12.8  12.0 - 15.0 (g/dL)   HCT 16.1  09.6 - 04.5 (%)   MCV 88.9  78.0 - 100.0 (fL)   MCH 30.8  26.0 - 34.0 (pg)   MCHC 34.7  30.0 - 36.0 (g/dL)   RDW 40.9  81.1 - 91.4 (%)   Platelets 199  150 - 400 (K/uL)  DIFFERENTIAL      Component Value Range   Neutrophils Relative 96 (*) 43 - 77 (%)   Neutro Abs 15.1 (*) 1.7 - 7.7 (K/uL)   Lymphocytes Relative 2 (*) 12 - 46 (%)   Lymphs Abs 0.3 (*) 0.7 - 4.0 (K/uL)   Monocytes Relative 2 (*) 3 - 12 (%)   Monocytes Absolute 0.3  0.1 - 1.0 (K/uL)   Eosinophils Relative 0  0 - 5 (%)    Eosinophils Absolute 0.0  0.0 - 0.7 (K/uL)   Basophils Relative 0  0 - 1 (%)   Basophils Absolute 0.0  0.0 - 0.1 (K/uL)  BASIC METABOLIC PANEL      Component Value Range   Sodium 137  135 - 145 (mEq/L)   Potassium 3.4 (*) 3.5 - 5.1 (mEq/L)   Chloride 105  96 - 112 (mEq/L)   CO2 25  19 - 32 (mEq/L)   Glucose, Bld 142 (*) 70 - 99 (mg/dL)   BUN 9  6 - 23 (mg/dL)   Creatinine, Ser 7.82 (*) 0.50 - 1.10 (mg/dL)   Calcium 8.6  8.4 - 95.6 (mg/dL)   GFR calc non Af Amer >90  >90 (mL/min)   GFR calc Af Amer >90  >90 (mL/min)  URINE MICROSCOPIC-ADD ON      Component Value Range   Squamous Epithelial / LPF MANY (*) RARE    WBC, UA 11-20  <3 (WBC/hpf)   Bacteria, UA FEW (*) RARE    Urine-Other MUCUS     1007 Spoke with Dr. Despina Hidden. He advised patient to be sent to [facility]. He will speak to accepting physician. He agreed to Rocephin 1 gm IV as antibiotic. [id number] Spoke with Dr. Debroah Loop.By dates, patient is 19 weeks 5 days. She is able to be treated at [facility]. 1029      MDM  Patient is G2,P1 5 months pregnant (EDC 10/14/11) here with continued flank pain, nausea, vomiting. She was treated for UTI on Thursday and has not improved. Urine still with 11-20 WBC, bilateral flank pain. She will be admitted at [facility] for pyelonephritis.The patient appears reasonably stabilized for admission considering the current resources, flow, and capabilities available in the ED at this time, and I doubt any other [facility] requiring further screening and/or treatment in the ED prior to admission.  I personally performed the services described in this documentation, which was scribed in my presence. The recorded information has been reviewed and considered. MDM Reviewed: nursing note, vitals and previous chart Reviewed previous: labs Interpretation: labs Consults: OB/GYN     Nicoletta Dress. Colon Branch, MD 05/25/11 1032

## 2011-05-25 NOTE — ED Notes (Signed)
Pt reports pain to her abdomin. Offered pt tylenol, pt refused.  Notified edp

## 2011-05-25 NOTE — ED Notes (Signed)
Ginger ale given to pt.  

## 2011-05-25 NOTE — Progress Notes (Signed)
Fetal heart tones at 161 beats per minute, will continue to monitor.

## 2011-05-26 NOTE — Progress Notes (Signed)
INITIAL ADULT NUTRITION ASSESSMENT Date: 05/26/2011   Time: 5:05 PM Reason for Assessment: Pregnant  ASSESSMENT: Female 26 y.o.  [id number]   Past Medical History  Diagnosis Date  . Scoliosis     Scheduled Meds:   . cefTRIAXone (ROCEPHIN)  IV  1 g Intravenous Q12H   Continuous Infusions:   . dextrose 5 % and 0.45 % NaCl with KCl 20 mEq/L 125 mL/hr at 05/25/11 2221   PRN Meds:.acetaminophen, acetaminophen, HYDROcodone-acetaminophen, ondansetron (ZOFRAN) IV, ondansetron, zolpidem  Ht: 5\' 5"  (165.1 cm)  Wt: 128 lb (58.06 kg)  Ideal Wt: 57 kg  % Ideal Wt: 102%  Usual Wt: 120-124# % Usual Wt: 106%  Body mass index is 21.30 kg/(m^2).  Food/Nutrition Related Hx: Pt vomiting 2 d prior to admission. Denies N/V currently and po tol now improving. Oral intake 50-75%. Add snacks between meals.    CMP     Component Value Date/Time   NA 137 05/25/2011 0731   K 3.4* 05/25/2011 0731   CL 105 05/25/2011 0731   CO2 25 05/25/2011 0731   GLUCOSE 142* 05/25/2011 0731   BUN 9 05/25/2011 0731   CREATININE 0.48* 05/25/2011 0731   CALCIUM 8.6 05/25/2011 0731   PROT 7.8 05/13/2009 1628   ALBUMIN 4.6 05/13/2009 1628   AST 23 05/13/2009 1628   ALT 20 05/13/2009 1628   ALKPHOS 71 05/13/2009 1628   BILITOT 1.1 05/13/2009 1628   GFRNONAA >90 05/25/2011 0731   GFRAA >90 05/25/2011 0731    Intake/Output Summary (Last 24 hours) at 05/26/11 1713 Last data filed at 05/26/11 1329  Gross per 24 hour  Intake    390 ml  Output      0 ml  Net    390 ml     Diet Order: Regular diet  Supplements/Tube Feeding:none at this time  IVF:    dextrose 5 % and 0.45 % NaCl with KCl 20 mEq/L Last Rate: 125 mL/hr at 05/25/11 2221    Estimated Nutritional Needs:   Kcal:1740 Protein:64-70 gr Fluid:2.0-2.3 L/d  NUTRITION DIAGNOSIS: -Increased nutrient needs (NI-5.1).  Status: Ongoing  RELATED TO: pregnancy  AS EVIDENCE BY: pt hx  MONITORING: diet tol and po intake  GOAL: Pt will tol diet and meet  >90% of est nutritional needs  EDUCATION NEEDS: -Education needs addressed  INTERVENTION:  -Snacks between meals -Rec prenatal vitamin   Dietitian [phone]  DOCUMENTATION CODES Per approved criteria  -Not Applicable    Francene Boyers 05/26/2011, 5:05 PM

## 2011-05-26 NOTE — Progress Notes (Signed)
Patient ID: Brianna Hughes, female   DOB: [birth year], 26 y.o.   MRN: [id number]  FACULTY PRACTICE ANTEPARTUM(COMPREHENSIVE) NOTE  Brianna Hughes is a 26 y.o. G2P1 at [redacted]w[redacted]d by LMP, early ultrasound who is admitted for pyelonephritis.   Length of Stay:  1  Days  Subjective:  Patient states pain is better, but still present.  Feels better when she moves around.  Patient reports the fetal movement as present. Patient reports uterine contraction  activity as none. Patient reports  vaginal bleeding as none. Patient describes fluid per vagina as None.  Vitals:  Blood pressure 85/54, pulse 71, temperature 97.2 F (36.2 C), temperature source Oral, resp. rate 20, height 5\' 5"  (1.651 m), weight 128 lb (58.06 kg), last menstrual period 12/09/2010, SpO2 99.00%. Physical Examination:  General appearance - alert, well appearing, and in no distress Back exam - full range of motion, no tenderness, palpable spasm or pain on motion, tender in both costovertebral angle  Labs:  Recent Results (from the past 48 hour(s))  URINALYSIS, ROUTINE W REFLEX MICROSCOPIC   Collection Time   05/25/11  7:29 AM      Component Value Range   Color, Urine ORANGE (*) YELLOW    APPearance CLEAR  CLEAR    Specific Gravity, Urine >1.030 (*) 1.005 - 1.030    pH 6.0  5.0 - 8.0    Glucose, UA NEGATIVE  NEGATIVE (mg/dL)   Hgb urine dipstick NEGATIVE  NEGATIVE    Bilirubin Urine NEGATIVE  NEGATIVE    Ketones, ur NEGATIVE  NEGATIVE (mg/dL)   Protein, ur TRACE  NEGATIVE (mg/dL)   Urobilinogen, UA 0.2  0.0 - 1.0 (mg/dL)   Nitrite NEGATIVE  NEGATIVE    Leukocytes, UA NEGATIVE  NEGATIVE   URINE MICROSCOPIC-ADD ON   Collection Time   05/25/11  7:29 AM      Component Value Range   Squamous Epithelial / LPF MANY (*) RARE    WBC, UA 11-20  <3 (WBC/hpf)   Bacteria, UA FEW (*) RARE    Urine-Other MUCUS    CBC   Collection Time   05/25/11  7:31 AM      Component Value Range   WBC 15.7 (*) 4.0 - 10.5 (K/uL)   RBC 4.15  3.87 -  5.11 (MIL/uL)   Hemoglobin 12.8  12.0 - 15.0 (g/dL)   HCT 34.7  42.5 - 95.6 (%)   MCV 88.9  78.0 - 100.0 (fL)   MCH 30.8  26.0 - 34.0 (pg)   MCHC 34.7  30.0 - 36.0 (g/dL)   RDW 38.7  56.4 - 33.2 (%)   Platelets 199  150 - 400 (K/uL)  DIFFERENTIAL   Collection Time   05/25/11  7:31 AM      Component Value Range   Neutrophils Relative 96 (*) 43 - 77 (%)   Neutro Abs 15.1 (*) 1.7 - 7.7 (K/uL)   Lymphocytes Relative 2 (*) 12 - 46 (%)   Lymphs Abs 0.3 (*) 0.7 - 4.0 (K/uL)   Monocytes Relative 2 (*) 3 - 12 (%)   Monocytes Absolute 0.3  0.1 - 1.0 (K/uL)   Eosinophils Relative 0  0 - 5 (%)   Eosinophils Absolute 0.0  0.0 - 0.7 (K/uL)   Basophils Relative 0  0 - 1 (%)   Basophils Absolute 0.0  0.0 - 0.1 (K/uL)  BASIC METABOLIC PANEL   Collection Time   05/25/11  7:31 AM      Component Value Range  Sodium 137  135 - 145 (mEq/L)   Potassium 3.4 (*) 3.5 - 5.1 (mEq/L)   Chloride 105  96 - 112 (mEq/L)   CO2 25  19 - 32 (mEq/L)   Glucose, Bld 142 (*) 70 - 99 (mg/dL)   Hughes 9  6 - 23 (mg/dL)   Creatinine, Ser 4.09 (*) 0.50 - 1.10 (mg/dL)   Calcium 8.6  8.4 - 81.1 (mg/dL)   GFR calc non Af Amer >90  >90 (mL/min)   GFR calc Af Amer >90  >90 (mL/min)     Imaging Studies:     Medications:  Scheduled    . cefTRIAXone (ROCEPHIN)  IV  1 g Intravenous Once  . cefTRIAXone (ROCEPHIN)  IV  1 g Intravenous Q12H  . morphine  2 mg Intravenous Once  . ondansetron  4 mg Intravenous Once   I have reviewed the patient's current medications.  ASSESSMENT: Pyelonephritis at 19 weeks 6 days gestation  PLAN: Continue rocephin probably for 72 hours total.  Tametha Banning H 05/26/2011,9:30 AM

## 2011-05-27 LAB — CBC
HCT: 30.4 % — ABNORMAL LOW (ref 36.0–46.0)
Hemoglobin: 10.4 g/dL — ABNORMAL LOW (ref 12.0–15.0)
MCHC: 34.2 g/dL (ref 30.0–36.0)
RDW: 13.1 % (ref 11.5–15.5)
WBC: 6.6 10*3/uL (ref 4.0–10.5)

## 2011-05-27 NOTE — Progress Notes (Signed)
Patient ID: Brianna Hughes, female   DOB: [birth year], 26 y.o.   MRN: [id number] Patient ID: Brianna Hughes, female   DOB: 14-Jan-1986, 26 y.o.   MRN: [id number]  FACULTY PRACTICE ANTEPARTUM(COMPREHENSIVE) NOTE  Brianna Hughes is a 26 y.o. G2P1 at [redacted]w[redacted]d by LMP, early ultrasound who is admitted for pyelonephritis.   Length of Stay:  2  Days  Subjective:  Patient states pain is better,now minimal..  Feels better when she moves around.  Patient reports the fetal movement as present. Patient reports uterine contraction  activity as none. Patient reports  vaginal bleeding as none. Patient describes fluid per vagina as None.  Vitals:  Blood pressure 94/65, pulse 61, temperature 98.2 F (36.8 C), temperature source Oral, resp. rate 18, height 5\' 5"  (1.651 m), weight 127 lb 6.8 oz (57.8 kg), last menstrual period 12/09/2010, SpO2 100.00%. Physical Examination:  General appearance - alert, well appearing, and in no distress Back exam - full range of motion, no tenderness, palpable spasm or pain on motion, tender in both costovertebral angle, but less than yesterday  Labs:  Recent Results (from the past 48 hour(s))  CBC   Collection Time   05/27/11  5:36 AM      Component Value Range   WBC 6.6  4.0 - 10.5 (K/uL)   RBC 3.35 (*) 3.87 - 5.11 (MIL/uL)   Hemoglobin 10.4 (*) 12.0 - 15.0 (g/dL)   HCT 57.8 (*) 46.9 - 46.0 (%)   MCV 90.7  78.0 - 100.0 (fL)   MCH 31.0  26.0 - 34.0 (pg)   MCHC 34.2  30.0 - 36.0 (g/dL)   RDW 62.9  52.8 - 41.3 (%)   Platelets 189  150 - 400 (K/uL)     Imaging Studies:     Medications:  Scheduled    . cefTRIAXone (ROCEPHIN)  IV  1 g Intravenous Q12H   I have reviewed the patient's current medications.  ASSESSMENT: Pyelonephritis at 19 weeks 6 days gestation  PLAN: Continue rocephin probably for 72 hours total.  Anticipate discharge tomorrow.  WBC much iomproved  EURE,LUTHER H 05/27/2011,9:30 AM

## 2011-05-28 NOTE — Progress Notes (Signed)
CARE MANAGEMENT NOTE 05/28/2011  Patient:  Brianna Hughes   Account Number:  [id number]  Date Initiated:  05/26/2011  Documentation initiated by:  Rosemary Holms  Subjective/Objective Assessment:   pt admitted with anemia. PTA lived at home with spouse. Currently his son, 53y0, lives with them also.     Action/Plan:   spoke to pt at bedside and he plans on going back home and does not anticipate any HH/DME needs   Anticipated DC Date:  05/28/2011   Anticipated DC Plan:  HOME/SELF CARE      DC Planning Services  CM consult      Choice offered to / List presented to:             Status of service:  Completed, signed off Medicare Important Message given?   (If response is "NO", the following Medicare IM given date fields will be blank) Date Medicare IM given:   Date Additional Medicare IM given:    Discharge Disposition:    Per UR Regulation:    Comments:  05/28/11 1000 Brianna Hughes Brianna Hawking RN BSN

## 2011-05-28 NOTE — Progress Notes (Signed)
Pt discharged home via family; Pt and family given and explained all discharge instructions, carenotes, and prescriptions; pt and family stated understanding and denied questions/concerns; all f/u appointments in place; IV removed without complicaitons; pt stable at time of discharge  

## 2011-05-28 NOTE — Discharge Summary (Signed)
Physician Discharge Summary  Patient ID: Brianna Hughes MRN: [id number] DOB/AGE: 08/25/1985 26 y.o.  Admit date: 05/25/2011 Discharge date: 05/28/2011  Admission Diagnoses: 19 weeks with pyelonephritis Discharge Diagnoses:  same  Discharged Condition: good  Hospital Course: see progress notes, responded to antibiotics well  Consults: none  Significant Diagnostic Studies: labs:   Treatments: antibiotics: ceftriaxone  Discharge Exam: Blood pressure 92/60, pulse 56, temperature 98 F (36.7 C), temperature source Oral, resp. rate 20, height 5\' 5"  (1.651 m), weight 127 lb 6.8 oz (57.8 kg), last menstrual period 12/09/2010, SpO2 96.00%. General appearance: alert, cooperative and no distress Back: negative, symmetric, no curvature. ROM normal. No CVA tenderness.  Disposition: Home or Self Care  Discharge Orders    Future Orders Please Complete By Expires   Diet - low sodium heart healthy      Increase activity slowly      Discharge instructions      Comments:   Make and Keep follow up appointment on Monday   Call MD for:  temperature >100.4        Medication List  As of 05/28/2011  9:06 AM   STOP taking these medications         UNABLE TO FIND         TAKE these medications         cephALEXin 500 MG capsule   Commonly known as: KEFLEX   Take 1 capsule (500 mg total) by mouth 4 (four) times daily.      ondansetron 8 MG tablet   Commonly known as: ZOFRAN   Take 8 mg by mouth every 8 (eight) hours as needed. For nausea      prenatal multivitamin Tabs   Take 1 tablet by mouth at bedtime.      SUBUTEX 2 MG Subl   Generic drug: buprenorphine   Place 2 mg under the tongue 2 (two) times daily.           Follow-up Information    Follow up with Lazaro Arms, MD on 05/31/2011.   Contact information:   [facility] [address] [place] [address] [phone]          Signed: Lazaro Arms 05/28/2011, 9:06 AM

## 2011-05-28 NOTE — Progress Notes (Signed)
Patient ID: Brianna Hughes, female   DOB: 15-Jan-1986, 26 y.o.   MRN: [id number] Patient ID: Brianna Hughes, female   DOB: 1985/07/24, 26 y.o.   MRN: [id number] Patient ID: Brianna Hughes, female   DOB: Jul 28, 1985, 26 y.o.   MRN: [id number]  FACULTY PRACTICE ANTEPARTUM(COMPREHENSIVE) NOTE  Brianna Hughes is a 26 y.o. G2P1 at [redacted]w[redacted]d by LMP, early ultrasound who is admitted for pyelonephritis.   Length of Stay:  3  Days  Subjective:  Patient states pain is better,now minimal..  Feels better when she moves around.  Patient reports the fetal movement as present. Patient reports uterine contraction  activity as none. Patient reports  vaginal bleeding as none. Patient describes fluid per vagina as None.  Vitals:  Blood pressure 92/60, pulse 56, temperature 98 F (36.7 C), temperature source Oral, resp. rate 20, height 5\' 5"  (1.651 m), weight 127 lb 6.8 oz (57.8 kg), last menstrual period 12/09/2010, SpO2 96.00%. Physical Examination:  General appearance - alert, well appearing, and in no distress Back exam - full range of motion, no tenderness, palpable spasm or pain on motion, no CVAT  Labs:  Recent Results (from the past 48 hour(s))  CBC   Collection Time   05/27/11  5:36 AM      Component Value Range   WBC 6.6  4.0 - 10.5 (K/uL)   RBC 3.35 (*) 3.87 - 5.11 (MIL/uL)   Hemoglobin 10.4 (*) 12.0 - 15.0 (g/dL)   HCT 78.4 (*) 69.6 - 46.0 (%)   MCV 90.7  78.0 - 100.0 (fL)   MCH 31.0  26.0 - 34.0 (pg)   MCHC 34.2  30.0 - 36.0 (g/dL)   RDW 29.5  28.4 - 13.2 (%)   Platelets 189  150 - 400 (K/uL)     Imaging Studies:     Medications:  Scheduled    . cefTRIAXone (ROCEPHIN)  IV  1 g Intravenous Q12H   I have reviewed the patient's current medications.  ASSESSMENT: Pyelonephritis at 19 weeks 6 days gestation  PLAN: Discharge home  Neola Worrall H 05/28/2011,9:09 AM

## 2011-06-30 ENCOUNTER — Other Ambulatory Visit: Payer: Self-pay

## 2011-06-30 ENCOUNTER — Ambulatory Visit (HOSPITAL_COMMUNITY)
Admission: RE | Admit: 2011-06-30 | Discharge: 2011-06-30 | Disposition: A | Discharge status: Home / Self Care | Payer: Medicaid Other | Source: Ambulatory Visit | Attending: Obstetrics and Gynecology | Admitting: Obstetrics and Gynecology

## 2011-06-30 DIAGNOSIS — R002 Palpitations: Secondary | ICD-10-CM | POA: Insufficient documentation

## 2011-06-30 DIAGNOSIS — O99891 Other specified diseases and conditions complicating pregnancy: Secondary | ICD-10-CM | POA: Insufficient documentation

## 2011-08-31 ENCOUNTER — Other Ambulatory Visit: Payer: Self-pay

## 2011-08-31 ENCOUNTER — Encounter (HOSPITAL_COMMUNITY): Payer: Self-pay | Admitting: *Deleted

## 2011-08-31 ENCOUNTER — Emergency Department (HOSPITAL_COMMUNITY)
Admission: EM | Admit: 2011-08-31 | Discharge: 2011-08-31 | Disposition: A | Discharge status: Home / Self Care | Payer: Medicaid Other | Attending: Emergency Medicine | Admitting: Emergency Medicine

## 2011-08-31 DIAGNOSIS — Z331 Pregnant state, incidental: Secondary | ICD-10-CM | POA: Insufficient documentation

## 2011-08-31 DIAGNOSIS — F17200 Nicotine dependence, unspecified, uncomplicated: Secondary | ICD-10-CM | POA: Insufficient documentation

## 2011-08-31 DIAGNOSIS — R030 Elevated blood-pressure reading, without diagnosis of hypertension: Secondary | ICD-10-CM | POA: Insufficient documentation

## 2011-08-31 DIAGNOSIS — R51 Headache: Secondary | ICD-10-CM | POA: Insufficient documentation

## 2011-08-31 LAB — PROTEIN / CREATININE RATIO, URINE
Creatinine, Urine: 42.12 mg/dL
Protein Creatinine Ratio: 0.38 — ABNORMAL HIGH (ref 0.00–0.15)
Total Protein, Urine: 16 mg/dL

## 2011-08-31 LAB — CBC
Platelets: 221 10*3/uL (ref 150–400)
RBC: 3.76 MIL/uL — ABNORMAL LOW (ref 3.87–5.11)
RDW: 12.4 % (ref 11.5–15.5)
WBC: 11.3 10*3/uL — ABNORMAL HIGH (ref 4.0–10.5)

## 2011-08-31 LAB — COMPREHENSIVE METABOLIC PANEL
ALT: 7 U/L (ref 0–35)
AST: 11 U/L (ref 0–37)
Alkaline Phosphatase: 127 U/L — ABNORMAL HIGH (ref 39–117)
CO2: 22 mEq/L (ref 19–32)
Calcium: 8.8 mg/dL (ref 8.4–10.5)
Chloride: 102 mEq/L (ref 96–112)
GFR calc non Af Amer: >90 mL/min (ref >90)
Potassium: 3.7 mEq/L (ref 3.5–5.1)
Sodium: 135 mEq/L (ref 135–145)

## 2011-08-31 LAB — DIFFERENTIAL
Basophils Absolute: 0 10*3/uL (ref 0.0–0.1)
Eosinophils Relative: 1 % (ref 0–5)
Lymphocytes Relative: 17 % (ref 12–46)
Lymphs Abs: 1.8 10*3/uL (ref 0.7–4.0)
Neutro Abs: 8.7 10*3/uL — ABNORMAL HIGH (ref 1.7–7.7)
Neutrophils Relative %: 77 % (ref 43–77)

## 2011-08-31 LAB — URINE MICROSCOPIC-ADD ON

## 2011-08-31 LAB — URINALYSIS, ROUTINE W REFLEX MICROSCOPIC
Protein, ur: NEGATIVE mg/dL
Specific Gravity, Urine: 1.015 (ref 1.005–1.030)
Urobilinogen, UA: 0.2 mg/dL (ref 0.0–1.0)

## 2011-08-31 NOTE — ED Provider Notes (Signed)
  History     CSN: [id number]  Arrival date and time: 08/31/11 1938   None     Chief Complaint  Patient presents with  . Hypertension   HPIPatient well known to my practice, being followed for pregnancy complicated by Opiate addiction, managed at present thru [facility] , has been converted to Suboxone, with gradual weaning from 6 mg /day to current dose of 2 mg daily. Pt was just adjusted down to 2mg  from 2,.5 mg today.  Pt had 17-P injection today in office at [facility] tree where BP 132/60's noted and pt self checks BP frequently.  Pt denies H/A, RUq Pain,   Pertinent Gynecological History:    Past Medical History  Diagnosis Date  . Scoliosis   . Pregnant state, incidental     Past Surgical History  Procedure Date  . Mouth surgery     No family history on file.  History  Substance Use Topics  . Smoking status: Current Everyday Smoker -- 1.0 packs/day for 10 years    Types: Cigarettes  . Smokeless tobacco: Not on file  . Alcohol Use: No    Allergies: No Known Allergies   (Not in a hospital admission)  ROS Physical Exam   Blood pressure 124/82, pulse 71, temperature 98.2 F (36.8 C), temperature source Oral, resp. rate 20, height 5\' 5"  (1.651 m), weight 67.586 kg (149 lb), last menstrual period 12/09/2010, SpO2 98.00%.  Physical ExamPhysical Examination: General appearance - alert, well appearing, and in no distress, oriented to person, place, and time and completely calm Mental status - alert, oriented to person, place, and time, normal mood, behavior, speech, dress, motor activity, and thought processes Abdomen - soft, nontender, nondistended, no masses or organomegaly Gravid uterus C/w dates Fetal monitoring reviewed by [facility] nursing, who indicate fetal monitoring strip is Reactive, no contractions Good accels. Pelvic - normal external genitalia, vulva, vagina, cervix, uterus and adnexa, examination not indicated Extremities - peripheral pulses normal, no  pedal edema, no clubbing or cyanosis, no pedal edema noted, Reflexes 2+ without clonus  .Protein/Creatinine Ratio elevated at 0.38, will send container to begin 24 hr Total Protein collection  Otherwise labs are negative for PIH workup  MAU Course  Procedures  MDM   Assessment and Plan Pregnancy 35 weeks Borderline elevated BP's  Rule out Preeclampsia PLAN Begin 24 hr total Protein collection in AM. Follow up [facility] Thursday morning with collected specimen    Helon Wisinski V 08/31/2011, 9:41 PM

## 2011-08-31 NOTE — Progress Notes (Signed)
Call from AP r/t pt sent from [facility], tracing reassuring and reactive. Call from Dr Emelda Fear for update, update given and dr Emelda Fear states informed and notified.

## 2011-08-31 NOTE — ED Provider Notes (Addendum)
History     CSN: [id number]  Arrival date & time 08/31/11  4098   First MD Initiated Contact with Patient 08/31/11 2011      Chief Complaint  Patient presents with  . Hypertension     Patient is a 26 y.o. female presenting with hypertension. The history is provided by the patient.  Hypertension This is a new problem. The current episode started 6 to 12 hours ago. The problem occurs constantly. The problem has not changed since onset.Associated symptoms include chest pain and headaches. Pertinent negatives include no abdominal pain and no shortness of breath. The symptoms are aggravated by nothing. The symptoms are relieved by nothing. She has tried rest for the symptoms. The treatment provided no relief.  pt reports mild headache, mild chest pressure today No focal weakness No syncope No abd pain No vaginal bleeding, no LE edema No back pain Denies contractions  Pt reports she is a G2P1 at approximately 33weeks She reports today she noted BP to be 140/89, and also 134/64 at her physicians office Her doctor's office advised to go the ER She has never had HTN issues No recent complications with this pregnancy She reports fetal movement She denies recent trauma She denies pleuritic type CP, only mild chest pressure  Past Medical History  Diagnosis Date  . Scoliosis   . Pregnant state, incidental     Past Surgical History  Procedure Date  . Mouth surgery     No family history on file.  History  Substance Use Topics  . Smoking status: Current Everyday Smoker -- 1.0 packs/day for 10 years    Types: Cigarettes  . Smokeless tobacco: Not on file  . Alcohol Use: No    OB History    Grav Para Term Preterm Abortions TAB SAB Ect Mult Living   2 1        1       Review of Systems  Respiratory: Negative for shortness of breath.   Cardiovascular: Positive for chest pain.  Gastrointestinal: Negative for abdominal pain.  Neurological: Positive for headaches.  All other  systems reviewed and are negative.    Allergies  Review of patient's allergies indicates no known allergies.  Home Medications   Current Outpatient Rx  Name Route Sig Dispense Refill  . BUPRENORPHINE HCL 2 MG SL SUBL Sublingual Place 2 mg under the tongue 2 (two) times daily.    Marland Kitchen ONDANSETRON HCL 8 MG PO TABS Oral Take 8 mg by mouth every 8 (eight) hours as needed. For nausea    . PRENATAL MULTIVITAMIN CH Oral Take 1 tablet by mouth at bedtime.      BP 138/94  Pulse 74  Temp(Src) 98.2 F (36.8 C) (Oral)  Resp 20  Ht 5\' 5"  (1.651 m)  Wt 149 lb (67.586 kg)  BMI 24.79 kg/m2  SpO2 100%  LMP 12/09/2010  Physical Exam CONSTITUTIONAL: Well developed/well nourished HEAD AND FACE: Normocephalic/atraumatic EYES: EOMI/PERRL ENMT: Mucous membranes moist NECK: supple no meningeal signs SPINE:entire spine nontender CV: S1/S2 noted, no murmurs/rubs/gallops noted LUNGS: Lungs are clear to auscultation bilaterally, no apparent distress ABDOMEN: soft, gravid, nontender, no rebound or guarding GU:no cva tenderness NEURO: Pt is awake/alert, moves all extremitiesx4, no arm/leg drift No facial droop.  No clonus noted No hyperreflexia noted  EXTREMITIES: pulses normal, full ROM, no edema noted SKIN: warm, color normal PSYCH: no abnormalities of mood noted  ED Course  Procedures    Labs Reviewed  URINALYSIS, ROUTINE W REFLEX MICROSCOPIC  8:36 PM D/w dr Emelda Fear, requests urine/cbc/cmet and will see in the ED 9:44 PM Seen by dr Emelda Fear He feels she is safe/stable for d/c home Pt is well appearing, no distress, resting comfortably, BP improved, unlikely pre-eclampsia But he will collect 24 hr urine in her office tomorrow due to elevated pr-creat ratio  The patient appears reasonably screened and/or stabilized for discharge and I doubt any other medical condition or other [facility] requiring further screening, evaluation, or treatment in the ED at this time prior to discharge.   MDM    Nursing notes reviewed and considered in documentation labs/vitals reviewed and considered        Date: 08/31/2011  Rate: 66  Rhythm: normal sinus rhythm  QRS Axis: normal  Intervals: normal  ST/T Wave abnormalities: nonspecific ST changes  Conduction Disutrbances:none  Narrative Interpretation:   Old EKG Reviewed: unchanged    Joya Gaskins, MD 08/31/11 2145  Joya Gaskins, MD 08/31/11 2147

## 2011-08-31 NOTE — ED Notes (Signed)
Patient placed on fetal monitor; fetal HR 136

## 2011-08-31 NOTE — Discharge Instructions (Signed)
RETURN FOR ANY WORSENED HEADACHE, WEAKNESS, CHEST PAIN, IF PASS OUT, IF YOU HAVE ABDOMINAL CRAMPING AND VAGINAL BLEEDING OVER THE NEXT 24 HOURS FOLLOWUP WITH DR Emelda Fear AS PREVIOUSLY SCHEDULED.

## 2011-08-31 NOTE — ED Notes (Signed)
States bp was slightly elevated at the doctor office today when she was there for a prenatal visit, states she was told to come here by her doctor, states she is dizzy now and has chest pain, denies any abdominal pain, patient is [redacted] weeks pregnant

## 2011-09-07 ENCOUNTER — Inpatient Hospital Stay (HOSPITAL_COMMUNITY)
Admission: AD | Admit: 2011-09-07 | Discharge: 2011-09-07 | Disposition: A | Discharge status: Home / Self Care | Payer: Medicaid Other | Source: Ambulatory Visit | Attending: Obstetrics & Gynecology | Admitting: Obstetrics & Gynecology

## 2011-09-07 ENCOUNTER — Encounter (HOSPITAL_COMMUNITY): Payer: Self-pay

## 2011-09-07 DIAGNOSIS — O479 False labor, unspecified: Secondary | ICD-10-CM

## 2011-09-07 DIAGNOSIS — O4700 False labor before 37 completed weeks of gestation, unspecified trimester: Secondary | ICD-10-CM | POA: Insufficient documentation

## 2011-09-07 DIAGNOSIS — Z3480 Encounter for supervision of other normal pregnancy, unspecified trimester: Secondary | ICD-10-CM

## 2011-09-07 HISTORY — DX: Headache: R51

## 2011-09-07 LAB — POCT FERN TEST: Fern Test: NEGATIVE

## 2011-09-07 NOTE — MAU Provider Note (Signed)
  History     CSN: [id number]  Arrival date and time: 09/07/11 1825  Chief Complaint  Patient presents with  . Labor Eval   HPI  26 y/o G2P1 @ 34.5 wks with contractions every 10 minutes, 4/10 pain. Possible fluid leakage. No vaginal bleeding. Feels baby moving. Baby looks good on the monitor.  Past Medical History  Diagnosis Date  . Scoliosis   . Pregnant state, incidental   . Headache     Past Surgical History  Procedure Date  . Mouth surgery     Family History  Problem Relation Age of Onset  . Anesthesia problems Neg Hx     History  Substance Use Topics  . Smoking status: Current Everyday Smoker -- 1.0 packs/day for 10 years    Types: Cigarettes  . Smokeless tobacco: Not on file  . Alcohol Use: No    Allergies: No Known Allergies  Prescriptions prior to admission  Medication Sig Dispense Refill  . buprenorphine-naloxone (SUBOXONE) 2-0.5 MG SUBL Place 1 tablet under the tongue every morning.      Marland Kitchen HYDROXYPROGESTERONE CAPROATE IM Inject into the muscle once a week. (17P Injection)      . Prenatal Vit-Fe Fumarate-FA (PRENATAL MULTIVITAMIN) TABS Take 1 tablet by mouth at bedtime.      . sertraline (ZOLOFT) 50 MG tablet Take 50 mg by mouth every morning.        ROS Pertinent items are noted in HPI.  Physical Exam   Blood pressure 131/89, pulse 77, temperature 98.9 F (37.2 C), temperature source Oral, resp. rate 18, height 5\' 5"  (1.651 m), weight 68.493 kg (151 lb), last menstrual period 12/09/2010, SpO2 100.00%.  Physical Exam General: c/f, nad, comfortable Abdomen: soft and non-tender without masses, organomegaly or hernias noted.  No guarding or rebound. Gravid. Extremities:   Non-tender, No cyanosis, edema, or deformity noted. Vaginal: normal appearing external vagina and closed cervix. Normal appearing white thin discharge. No pooling.   Cervix: 1 cm/50%/-2 Garry Heater RN MAU Course  Procedures  MDM - sterile speculum exam revealed closed  cervix. - Ferning negative on good sample.   Assessment and Plan  26 y/o G2P1 @ 34.5 wks with False Labor  1. False Labor  - mild contractions q 10 minutes - 1 cm cervix. - discharge home  - follow up if red flags  Vedanshi Massaro MD 09/07/2011, 7:31 PM  Case discussed with Philipp Deputy, Midwife. - plan

## 2011-09-07 NOTE — MAU Note (Signed)
Patient states she is having contractions every few minutes. Had some leaking of clear fluid this am, none since. Reports a spot of blood and good fetal movement.

## 2011-09-07 NOTE — MAU Note (Signed)
Pt in c/o cramping since this morning around 0330.  States it started out about 20 minutes apart,but has progressed to about 5 minutes apart since 1600.  Denies any bleeding.  Reports small amount of mucus discharge off and on since Friday.  + FM.

## 2011-09-29 ENCOUNTER — Encounter (HOSPITAL_COMMUNITY): Payer: Self-pay | Admitting: *Deleted

## 2011-09-29 ENCOUNTER — Inpatient Hospital Stay (HOSPITAL_COMMUNITY)
Admission: AD | Admit: 2011-09-29 | Discharge: 2011-09-29 | Disposition: A | Discharge status: Home / Self Care | Payer: Medicaid Other | Source: Ambulatory Visit | Attending: Obstetrics & Gynecology | Admitting: Obstetrics & Gynecology

## 2011-09-29 DIAGNOSIS — O479 False labor, unspecified: Secondary | ICD-10-CM | POA: Insufficient documentation

## 2011-09-29 NOTE — MAU Note (Signed)
Pt sent down to birthing suites to room 169 to be triaged for labor eval.  Urban Gibson, RN not on floor, report given to Clydie Braun per Urban Gibson, Consulting civil engineer.

## 2011-09-29 NOTE — Discharge Instructions (Signed)
Labor Induction  Most women go into labor on their own between 37 and 42 weeks of the pregnancy. When this does not happen or when there is a medical need, medicine or other methods may be used to induce labor. Labor induction causes a pregnant woman's uterus to contract. It also causes the cervix to soften (ripen), open (dilate), and thin out (efface). Usually, labor is not induced before 39 weeks of the pregnancy unless there is a problem with the baby or mother. Whether your labor will be induced depends on a number of factors, including the following:  The medical condition of you and the baby.   How many weeks along you are.   The status of baby's lung maturity.   The condition of the cervix.   The position of the baby.  REASONS FOR LABOR INDUCTION  The health of the baby or mother is at risk.   The pregnancy is overdue by 1 week or more.   The water breaks but labor does not start on its own.   The mother has a health condition or serious illness such as high blood pressure, infection, placental abruption, or diabetes.   The amniotic fluid amounts are low around the baby.   The baby is distressed.  REASONS TO NOT INDUCE LABOR Labor induction may not be a good idea if:  It is shown that your baby does not tolerate labor.   An induction is just more convenient.   You want the baby to be born on a certain date, like a holiday.   You have had previous surgeries on your uterus, such as a myomectomy or the removal of fibroids.   Your placenta lies very low in the uterus and blocks the opening of the cervix (placenta previa).   Your baby is not in a head down position.   The umbilical cord drops down into the birth canal in front of the baby. This could cut off the baby's blood and oxygen supply.   You have had a previous cesarean delivery.   There areunusual circumstances, such as the baby being extremely premature.  RISKS AND COMPLICATIONS Problems may occur in the  process of induction and plans may need to be modified as a situation unfolds. Some of the risks of induction include:  Change in fetal heart rate, such as too high, too low, or erratic.   Risk of fetal distress.   Risk of infection to mother and baby.   Increased chance of having a cesarean delivery.   The rare, but increased chance that the placenta will separate from the uterus (abruption).   Uterine rupture (very rare).  When induction is needed for medical reasons, the benefits of induction may outweigh the risks. BEFORE THE PROCEDURE Your caregiver will check your cervix and the baby's position. This will help your caregiver decide if you are far enough along for an induction to work. PROCEDURE Several methods of labor induction may be used, such as:   Taking prostaglandin medicine to dilate and ripen the cervix. The medicine will also start contractions. It can be taken by mouth or by inserting a suppository into the vagina.   A thin tube (catheter) with a balloon on the end may be inserted into your vagina to dilate the cervix. Once inserted, the balloon expands with water, which causes the cervix to open.   Striping the membranes. Your caregiver inserts a finger between the cervix and membranes, which causes the cervix to be stretched and   may cause the uterus to contract. This is often done during an office visit. You will be sent home to wait for the contractions to begin. You will then come in for an induction.   Breaking the water. Your caregiver will make a hole in the amniotic sac using a small instrument. Once the amniotic sac breaks, contractions should begin. This may still take hours to see an effect.   Taking medicine to trigger or strengthen contractions. This medicine is given intravenously through a tube in your arm.  All of the methods of induction, besides stripping the membranes, will be done in the hospital. Induction is done in the hospital so that you and the  baby can be carefully monitored. AFTER THE PROCEDURE Some inductions can take up to 2 or 3 days. Depending on the cervix, it usually takes less time. It takes longer when you are induced early in the pregnancy or if this is your first pregnancy. If a mother is still pregnant and the induction has been going on for 2 to 3 days, either the mother will be sent home or a cesarean delivery will be needed. Document Released: 09/15/2006 Document Revised: 04/15/2011 Document Reviewed: 03/01/2011 [employer] Patient Information 2012 [employer].Fetal Movement Counts Patient Name: __________________________________________________ Patient Due Date: ____________________ Kick counts is highly recommended in high risk pregnancies, but it is a good idea for every pregnant woman to do. Start counting fetal movements at 28 weeks of the pregnancy. Fetal movements increase after eating a full meal or eating or drinking something sweet (the blood sugar is higher). It is also important to drink plenty of fluids (well hydrated) before doing the count. Lie on your left side because it helps with the circulation or you can sit in a comfortable chair with your arms over your belly (abdomen) with no distractions around you. DOING THE COUNT  Try to do the count the same time of day each time you do it.   Mark the day and time, then see how long it takes for you to feel 10 movements (kicks, flutters, swishes, rolls). You should have at least 10 movements within 2 hours. You will most likely feel 10 movements in much less than 2 hours. If you do not, wait an hour and count again. After a couple of days you will see a pattern.   What you are looking for is a change in the pattern or not enough counts in 2 hours. Is it taking longer in time to reach 10 movements?  SEEK MEDICAL CARE IF:  You feel less than 10 counts in 2 hours. Tried twice.   No movement in one hour.   The pattern is changing or taking longer each day to  reach 10 counts in 2 hours.   You feel the baby is not moving as it usually does.  Date: ____________ Movements: ____________ Start time: ____________ Finish time: ____________  Date: ____________ Movements: ____________ Start time: ____________ Finish time: ____________ Date: ____________ Movements: ____________ Start time: ____________ Finish time: ____________ Date: ____________ Movements: ____________ Start time: ____________ Finish time: ____________ Date: ____________ Movements: ____________ Start time: ____________ Finish time: ____________ Date: ____________ Movements: ____________ Start time: ____________ Finish time: ____________ Date: ____________ Movements: ____________ Start time: ____________ Finish time: ____________ Date: ____________ Movements: ____________ Start time: ____________ Finish time: ____________  Date: ____________ Movements: ____________ Start time: ____________ Finish time: ____________ Date: ____________ Movements: ____________ Start time: ____________ Finish time: ____________ Date: ____________ Movements: ____________ Start time: ____________ Finish time: ____________   Date: ____________ Movements: ____________ Start time: ____________ Finish time: ____________ Date: ____________ Movements: ____________ Start time: ____________ Finish time: ____________ Date: ____________ Movements: ____________ Start time: ____________ Finish time: ____________ Date: ____________ Movements: ____________ Start time: ____________ Finish time: ____________  Date: ____________ Movements: ____________ Start time: ____________ Finish time: ____________ Date: ____________ Movements: ____________ Start time: ____________ Finish time: ____________ Date: ____________ Movements: ____________ Start time: ____________ Finish time: ____________ Date: ____________ Movements: ____________ Start time: ____________ Finish time: ____________ Date: ____________ Movements: ____________ Start time:  ____________ Finish time: ____________ Date: ____________ Movements: ____________ Start time: ____________ Finish time: ____________ Date: ____________ Movements: ____________ Start time: ____________ Finish time: ____________  Date: ____________ Movements: ____________ Start time: ____________ Finish time: ____________ Date: ____________ Movements: ____________ Start time: ____________ Finish time: ____________ Date: ____________ Movements: ____________ Start time: ____________ Finish time: ____________ Date: ____________ Movements: ____________ Start time: ____________ Finish time: ____________ Date: ____________ Movements: ____________ Start time: ____________ Finish time: ____________ Date: ____________ Movements: ____________ Start time: ____________ Finish time: ____________ Date: ____________ Movements: ____________ Start time: ____________ Finish time: ____________  Date: ____________ Movements: ____________ Start time: ____________ Finish time: ____________ Date: ____________ Movements: ____________ Start time: ____________ Finish time: ____________ Date: ____________ Movements: ____________ Start time: ____________ Finish time: ____________ Date: ____________ Movements: ____________ Start time: ____________ Finish time: ____________ Date: ____________ Movements: ____________ Start time: ____________ Finish time: ____________ Date: ____________ Movements: ____________ Start time: ____________ Finish time: ____________ Date: ____________ Movements: ____________ Start time: ____________ Finish time: ____________  Date: ____________ Movements: ____________ Start time: ____________ Finish time: ____________ Date: ____________ Movements: ____________ Start time: ____________ Finish time: ____________ Date: ____________ Movements: ____________ Start time: ____________ Finish time: ____________ Date: ____________ Movements: ____________ Start time: ____________ Finish time: ____________ Date:  ____________ Movements: ____________ Start time: ____________ Finish time: ____________ Date: ____________ Movements: ____________ Start time: ____________ Finish time: ____________ Date: ____________ Movements: ____________ Start time: ____________ Finish time: ____________  Date: ____________ Movements: ____________ Start time: ____________ Finish time: ____________ Date: ____________ Movements: ____________ Start time: ____________ Finish time: ____________ Date: ____________ Movements: ____________ Start time: ____________ Finish time: ____________ Date: ____________ Movements: ____________ Start time: ____________ Finish time: ____________ Date: ____________ Movements: ____________ Start time: ____________ Finish time: ____________ Date: ____________ Movements: ____________ Start time: ____________ Finish time: ____________ Date: ____________ Movements: ____________ Start time: ____________ Finish time: ____________  Date: ____________ Movements: ____________ Start time: ____________ Finish time: ____________ Date: ____________ Movements: ____________ Start time: ____________ Finish time: ____________ Date: ____________ Movements: ____________ Start time: ____________ Finish time: ____________ Date: ____________ Movements: ____________ Start time: ____________ Finish time: ____________ Date: ____________ Movements: ____________ Start time: ____________ Finish time: ____________ Date: ____________ Movements: ____________ Start time: ____________ Finish time: ____________ Document Released: 05/26/2006 Document Revised: 04/15/2011 Document Reviewed: 11/26/2008 [employer] Patient Information 2012 [employer]. 

## 2011-09-29 NOTE — OB Triage Note (Signed)
Dr. Adrian Blackwater reviewed strip. Notified of SVE and uterine activity. Discharge orders received. Isac Sarna, RN

## 2011-10-13 ENCOUNTER — Encounter (HOSPITAL_COMMUNITY): Payer: Self-pay | Admitting: Anesthesiology

## 2011-10-13 ENCOUNTER — Inpatient Hospital Stay (HOSPITAL_COMMUNITY): Payer: Medicaid Other | Admitting: Anesthesiology

## 2011-10-13 ENCOUNTER — Encounter (HOSPITAL_COMMUNITY): Payer: Self-pay | Admitting: *Deleted

## 2011-10-13 ENCOUNTER — Inpatient Hospital Stay (HOSPITAL_COMMUNITY)
Admission: AD | Admit: 2011-10-13 | Discharge: 2011-10-15 | DRG: 774 | Disposition: A | Discharge status: Home / Self Care | Payer: Medicaid Other | Source: Ambulatory Visit | Attending: Obstetrics & Gynecology | Admitting: Obstetrics & Gynecology

## 2011-10-13 DIAGNOSIS — IMO0001 Reserved for inherently not codable concepts without codable children: Secondary | ICD-10-CM

## 2011-10-13 DIAGNOSIS — O1002 Pre-existing essential hypertension complicating childbirth: Secondary | ICD-10-CM | POA: Diagnosis present

## 2011-10-13 LAB — URINE MICROSCOPIC-ADD ON

## 2011-10-13 LAB — URINALYSIS, ROUTINE W REFLEX MICROSCOPIC
Ketones, ur: NEGATIVE mg/dL
Nitrite: NEGATIVE
Protein, ur: NEGATIVE mg/dL
pH: 7 (ref 5.0–8.0)

## 2011-10-13 LAB — CBC
Hemoglobin: 12.3 g/dL (ref 12.0–15.0)
MCH: 30.8 pg (ref 26.0–34.0)
MCHC: 34.2 g/dL (ref 30.0–36.0)
Platelets: 210 10*3/uL (ref 150–400)
RDW: 12.9 % (ref 11.5–15.5)

## 2011-10-13 LAB — COMPREHENSIVE METABOLIC PANEL
ALT: 8 U/L (ref 0–35)
Albumin: 2.7 g/dL — ABNORMAL LOW (ref 3.5–5.2)
Alkaline Phosphatase: 170 U/L — ABNORMAL HIGH (ref 39–117)
Calcium: 9 mg/dL (ref 8.4–10.5)
GFR calc Af Amer: >90 mL/min (ref >90)
Glucose, Bld: 108 mg/dL — ABNORMAL HIGH (ref 70–99)
Potassium: 3.8 mEq/L (ref 3.5–5.1)
Sodium: 134 mEq/L — ABNORMAL LOW (ref 135–145)
Total Protein: 6.5 g/dL (ref 6.0–8.3)

## 2011-10-13 LAB — RAPID URINE DRUG SCREEN, HOSP PERFORMED: Opiates: NOT DETECTED

## 2011-10-13 LAB — PROTEIN / CREATININE RATIO, URINE: Protein Creatinine Ratio: 0.13 (ref 0.00–0.15)

## 2011-10-13 MED ORDER — DIPHENHYDRAMINE HCL 50 MG/ML IJ SOLN: 12.5000 mg | INTRAMUSCULAR | Status: DC | PRN

## 2011-10-13 MED ORDER — LIDOCAINE HCL (PF) 1 % IJ SOLN
30.0000 mL | INTRAMUSCULAR | Status: DC | PRN
  Filled 2011-10-13 (×2): qty 30

## 2011-10-13 MED ORDER — SIMETHICONE 80 MG PO CHEW: 80.0000 mg | CHEWABLE_TABLET | ORAL | Status: DC | PRN

## 2011-10-13 MED ORDER — LACTATED RINGERS IV SOLN
INTRAVENOUS | Status: DC
  Administered 2011-10-13 (×3): via INTRAVENOUS

## 2011-10-13 MED ORDER — CITRIC ACID-SODIUM CITRATE 334-500 MG/5ML PO SOLN: 30.0000 mL | ORAL | Status: DC | PRN

## 2011-10-13 MED ORDER — EPHEDRINE 5 MG/ML INJ
10.0000 mg | INTRAVENOUS | Status: DC | PRN
  Filled 2011-10-13: qty 2

## 2011-10-13 MED ORDER — PRENATAL MULTIVITAMIN CH
1.0000 | ORAL_TABLET | Freq: Every day | ORAL | Status: DC
  Administered 2011-10-13 – 2011-10-14 (×2): 1 via ORAL
  Filled 2011-10-13 (×2): qty 1

## 2011-10-13 MED ORDER — SENNOSIDES-DOCUSATE SODIUM 8.6-50 MG PO TABS
2.0000 | ORAL_TABLET | Freq: Every day | ORAL | Status: DC
  Administered 2011-10-14 (×2): 2 via ORAL

## 2011-10-13 MED ORDER — ZOLPIDEM TARTRATE 5 MG PO TABS: 5.0000 mg | ORAL_TABLET | Freq: Every evening | ORAL | Status: DC | PRN

## 2011-10-13 MED ORDER — WITCH HAZEL-GLYCERIN EX PADS: 1.0000 "application " | MEDICATED_PAD | CUTANEOUS | Status: DC | PRN

## 2011-10-13 MED ORDER — LANOLIN HYDROUS EX OINT: TOPICAL_OINTMENT | CUTANEOUS | Status: DC | PRN

## 2011-10-13 MED ORDER — EPHEDRINE 5 MG/ML INJ
10.0000 mg | INTRAVENOUS | Status: DC | PRN
  Filled 2011-10-13: qty 2
  Filled 2011-10-13: qty 4

## 2011-10-13 MED ORDER — PHENYLEPHRINE 40 MCG/ML (10ML) SYRINGE FOR IV PUSH (FOR BLOOD PRESSURE SUPPORT)
80.0000 ug | PREFILLED_SYRINGE | INTRAVENOUS | Status: DC | PRN
  Filled 2011-10-13: qty 5
  Filled 2011-10-13: qty 2

## 2011-10-13 MED ORDER — TETANUS-DIPHTH-ACELL PERTUSSIS 5-2.5-18.5 LF-MCG/0.5 IM SUSP
0.5000 mL | Freq: Once | INTRAMUSCULAR | Status: AC
  Administered 2011-10-14: 0.5 mL via INTRAMUSCULAR
  Filled 2011-10-13: qty 0.5

## 2011-10-13 MED ORDER — FENTANYL 2.5 MCG/ML BUPIVACAINE 1/10 % EPIDURAL INFUSION (WH - ANES)
INTRAMUSCULAR | Status: DC | PRN
  Administered 2011-10-13: 14 mL/h via EPIDURAL

## 2011-10-13 MED ORDER — IBUPROFEN 600 MG PO TABS
600.0000 mg | ORAL_TABLET | Freq: Four times a day (QID) | ORAL | Status: DC
  Administered 2011-10-13 – 2011-10-15 (×8): 600 mg via ORAL
  Filled 2011-10-13 (×8): qty 1

## 2011-10-13 MED ORDER — OXYTOCIN BOLUS FROM INFUSION
500.0000 mL | Freq: Once | INTRAVENOUS | Status: AC
  Administered 2011-10-13: 500 mL via INTRAVENOUS
  Filled 2011-10-13: qty 1000
  Filled 2011-10-13: qty 500

## 2011-10-13 MED ORDER — DIPHENHYDRAMINE HCL 25 MG PO CAPS: 25.0000 mg | ORAL_CAPSULE | Freq: Four times a day (QID) | ORAL | Status: DC | PRN

## 2011-10-13 MED ORDER — ONDANSETRON HCL 4 MG PO TABS: 4.0000 mg | ORAL_TABLET | ORAL | Status: DC | PRN

## 2011-10-13 MED ORDER — ONDANSETRON HCL 4 MG/2ML IJ SOLN: 4.0000 mg | Freq: Four times a day (QID) | INTRAMUSCULAR | Status: DC | PRN

## 2011-10-13 MED ORDER — BENZOCAINE-MENTHOL 20-0.5 % EX AERO
1.0000 "application " | INHALATION_SPRAY | CUTANEOUS | Status: DC | PRN
  Administered 2011-10-13 – 2011-10-14 (×2): 1 via TOPICAL
  Filled 2011-10-13 (×2): qty 56

## 2011-10-13 MED ORDER — IBUPROFEN 600 MG PO TABS: 600.0000 mg | ORAL_TABLET | Freq: Four times a day (QID) | ORAL | Status: DC | PRN

## 2011-10-13 MED ORDER — ONDANSETRON HCL 4 MG/2ML IJ SOLN: 4.0000 mg | INTRAMUSCULAR | Status: DC | PRN

## 2011-10-13 MED ORDER — FENTANYL 2.5 MCG/ML BUPIVACAINE 1/10 % EPIDURAL INFUSION (WH - ANES)
14.0000 mL/h | INTRAMUSCULAR | Status: DC
  Administered 2011-10-13: 14 mL/h via EPIDURAL
  Filled 2011-10-13 (×2): qty 60

## 2011-10-13 MED ORDER — LACTATED RINGERS IV SOLN: 500.0000 mL | INTRAVENOUS | Status: DC | PRN

## 2011-10-13 MED ORDER — OXYTOCIN 20 UNITS IN LACTATED RINGERS INFUSION - SIMPLE
125.0000 mL/h | Freq: Once | INTRAVENOUS | Status: AC
  Administered 2011-10-13: 125 mL/h via INTRAVENOUS

## 2011-10-13 MED ORDER — OXYCODONE-ACETAMINOPHEN 5-325 MG PO TABS: 1.0000 | ORAL_TABLET | ORAL | Status: DC | PRN

## 2011-10-13 MED ORDER — FLEET ENEMA 7-19 GM/118ML RE ENEM: 1.0000 | ENEMA | RECTAL | Status: DC | PRN

## 2011-10-13 MED ORDER — OXYCODONE-ACETAMINOPHEN 5-325 MG PO TABS
1.0000 | ORAL_TABLET | ORAL | Status: DC | PRN
  Administered 2011-10-15: 1 via ORAL
  Filled 2011-10-13: qty 1

## 2011-10-13 MED ORDER — LACTATED RINGERS IV SOLN
500.0000 mL | Freq: Once | INTRAVENOUS | Status: AC
  Administered 2011-10-13: 500 mL via INTRAVENOUS

## 2011-10-13 MED ORDER — ACETAMINOPHEN 325 MG PO TABS: 650.0000 mg | ORAL_TABLET | ORAL | Status: DC | PRN

## 2011-10-13 MED ORDER — LIDOCAINE HCL (PF) 1 % IJ SOLN
INTRAMUSCULAR | Status: DC | PRN
  Administered 2011-10-13 (×2): 4 mL

## 2011-10-13 MED ORDER — PHENYLEPHRINE 40 MCG/ML (10ML) SYRINGE FOR IV PUSH (FOR BLOOD PRESSURE SUPPORT)
80.0000 ug | PREFILLED_SYRINGE | INTRAVENOUS | Status: DC | PRN
  Filled 2011-10-13: qty 2

## 2011-10-13 MED ORDER — DIBUCAINE 1 % RE OINT: 1.0000 "application " | TOPICAL_OINTMENT | RECTAL | Status: DC | PRN

## 2011-10-13 NOTE — Anesthesia Preprocedure Evaluation (Signed)
Anesthesia Evaluation  Patient identified by MRN, date of birth, ID band Patient awake    Reviewed: Allergy & Precautions, H&P , Patient's Chart, lab work & pertinent test results  Airway Mallampati: III TM Distance: >3 FB Neck ROM: full    Dental No notable dental hx. (+) Teeth Intact   Pulmonary neg pulmonary ROS,  breath sounds clear to auscultation  Pulmonary exam normal       Cardiovascular negative cardio ROS  Rhythm:regular Rate:Normal     Neuro/Psych  Headaches, Hx/o substance abuse on suboxone negative neurological ROS  negative psych ROS   GI/Hepatic negative GI ROS, Neg liver ROS,   Endo/Other  negative endocrine ROS  Renal/GU negative Renal ROS  negative genitourinary   Musculoskeletal   Abdominal   Peds  Hematology negative hematology ROS (+)   Anesthesia Other Findings Pierced tongue  Reproductive/Obstetrics (+) Pregnancy                           Anesthesia Physical Anesthesia Plan  ASA: II  Anesthesia Plan: Epidural   Post-op Pain Management:    Induction:   Airway Management Planned:   Additional Equipment:   Intra-op Plan:   Post-operative Plan:   Informed Consent: I have reviewed the patients History and Physical, chart, labs and discussed the procedure including the risks, benefits and alternatives for the proposed anesthesia with the patient or authorized representative who has indicated his/her understanding and acceptance.     Plan Discussed with: Anesthesiologist  Anesthesia Plan Comments:         Anesthesia Quick Evaluation

## 2011-10-13 NOTE — H&P (Signed)
Brianna Hughes is a 26 y.o. female presenting for labor. Maternal Medical History:  Reason for admission: Reason for admission: contractions.  Contractions: Onset was 6-12 hours ago.   Frequency: regular.   Duration is approximately 1 minute.   Perceived severity is strong.    Fetal activity: Perceived fetal activity is normal.   Last perceived fetal movement was within the past hour.    Prenatal complications: Substance abuse (Patient with history of opiate additction, now on suboxone.  Denies other substance use during pregnancy.).   Prenatal Complications - Diabetes: none.    Patient states she lost her mucous plug earlier.  No vaginal bleeding or loss of fluid.    OB History    Grav Para Term Preterm Abortions TAB SAB Ect Mult Living   2 1 1  0 0 0 0 0 0 1     Past Medical History  Diagnosis Date  . Scoliosis   . Pregnant state, incidental   . Headache   [facility] +TH[facility], benzos. History of opiate addiction, now on suboxone Prior birth at 66 weeks, baby never in NICU, went home with mother  Past Surgical History  Procedure Date  . Mouth surgery    Family History: family history is negative for Anesthesia problems, and Hypotension, and Malignant hyperthermia, and Pseudochol deficiency, . Social History:  reports that she has been smoking Cigarettes.  She has a 2.5 pack-year smoking history. She has never used smokeless tobacco. She reports that she uses illicit drugs (Marijuana and Benzodiazepines). She reports that she does not drink alcohol.  ROS  As per HPI.  Other relevant ROS negative.  Dilation: 6 (bulging bag) Effacement (%): 100 Station: -1 Exam by:: Dr. Lahoma Rocker Blood pressure 133/91, pulse 73, temperature 98.3 F (36.8 C), temperature source Oral, resp. rate 18, height 5\' 5"  (1.651 m), weight 71.215 kg (157 lb), last menstrual period 12/09/2010, SpO2 100.00%. Maternal Exam:  Uterine Assessment: Contraction strength is firm.  Contraction duration  is 1 minute. Contraction frequency is regular.   Abdomen: Fundal height is Appropraite for gestational age.   Fetal presentation: vertex  Introitus: Vulva is negative for condylomata, edema, lesion, piercings and varicosities.  Vagina is negative for condylomata, cysts, discharge, edema and ulcerations.  Pelvis: adequate for delivery.   Cervix: Cervix evaluated by digital exam.     Fetal Exam Fetal Monitor Review: Baseline rate: 125.  Variability: moderate (6-25 bpm).   Pattern: accelerations present and no decelerations.    Fetal State Assessment: Category I - tracings are normal.     Physical Exam  Constitutional: She is oriented to person, place, and time. She appears well-developed and well-nourished. No distress.  HENT:  Head: Normocephalic and atraumatic.  Eyes: Conjunctivae are normal. Right eye exhibits no discharge. Left eye exhibits no discharge. No scleral icterus.  Neck: No tracheal deviation present.  Cardiovascular: Normal rate, regular rhythm and normal heart sounds.   Respiratory: Effort normal and breath sounds normal. No stridor.  GI: Soft. She exhibits no distension and no mass. There is no tenderness. There is no rebound and no guarding.       Abdomen gravid  Genitourinary: Vulva exhibits no lesion. No vaginal discharge found.  Musculoskeletal: She exhibits no edema.  Neurological: She is alert and oriented to person, place, and time. She displays abnormal reflex (3+ patellar reflex).  Skin: Skin is warm and dry. She is not diaphoretic.  Psychiatric: She has a normal mood and affect. Her behavior is normal. Judgment  and thought content normal.    Prenatal labs: ABO, Rh: O/Positive/-- (10/16 0000) Antibody: Negative (10/16 0000) Rubella: Immune (10/16 0000) RPR: Nonreactive (10/16 0000)  HBsAg: Negative (10/16 0000)  HIV: Non-reactive (10/16 0000)  GBS: Negative (06/05 0000)   Assessment/Plan: Expectant management, so far progressing well. Expected  mode of delivery vaginal. Desires epidural.  With regard to hyperreflexia, hypertension, her PIH labs were reassuring.  We will continue to monitor both hypertension and reflexes.  Clancy Gourd 10/13/2011, 11:32 AM    Saw pt and agree.

## 2011-10-13 NOTE — Progress Notes (Signed)
Brianna Hughes is a 26 y.o. G2P1001 at [redacted]w[redacted]d  Subjective:  She is comfortable with her epidural and requested to have AROM.  Objective: BP 127/70  Pulse 74  Temp(Src) 98.2 F (36.8 C) (Oral)  Resp 20  Ht 5\' 5"  (1.651 m)  Wt 71.215 kg (157 lb)  BMI 26.13 kg/m2  SpO2 100%  LMP 12/09/2010      FHT:  FHR: 135 bpm, variability: moderate,  accelerations:  Present,  decelerations:  Present occasional, early UC:   regular, every 3-5 minutes SVE:   Dilation: 7 Effacement (%): 100 Station: -1 Exam by:: Dr. Lahoma Rocker  Labs: Lab Results  Component Value Date   WBC 12.1* 10/13/2011   HGB 12.3 10/13/2011   HCT 36.0 10/13/2011   MCV 90.2 10/13/2011   PLT 210 10/13/2011   AROM performed  Assessment / Plan: Spontaneous labor, progressing normally  Labor: Progressing normally Fetal Wellbeing:  Category I Pain Control:  Epidural Anticipated MOD:  NSVD  Hypertension has resolved.  Brianna Hughes 10/13/2011, 2:43 PM  Agree with above note.

## 2011-10-13 NOTE — Anesthesia Procedure Notes (Signed)
Epidural Patient location during procedure: OB Start time: 10/13/2011 12:02 PM  Staffing Anesthesiologist: Adaria Hole A. Performed by: anesthesiologist   Preanesthetic Checklist Completed: patient identified, site marked, surgical consent, pre-op evaluation, timeout performed, IV checked, risks and benefits discussed and monitors and equipment checked  Epidural Patient position: sitting Prep: site prepped and draped and DuraPrep Patient monitoring: continuous pulse ox and blood pressure Approach: midline Injection technique: LOR air  Needle:  Needle type: Tuohy  Needle gauge: 17 G Needle length: 9 cm Needle insertion depth: 6 cm Catheter type: closed end flexible Catheter size: 19 Gauge Catheter at skin depth: 11 cm Test dose: negative and Other  Assessment Events: blood not aspirated, injection not painful, no injection resistance, negative IV test and no paresthesia  Additional Notes Patient identified. Risks and benefits discussed including failed block, incomplete  Pain control, post dural puncture headache, nerve damage, paralysis, blood pressure Changes, nausea, vomiting, reactions to medications-both toxic and allergic and post Partum back pain. All questions were answered. Patient expressed understanding and wished to proceed. Sterile technique was used throughout procedure. Epidural site was Dressed with sterile barrier dressing. No paresthesias, signs of intravascular injection Or signs of intrathecal spread were encountered.  Patient was more comfortable after the epidural was dosed. Please see RN's note for documentation of vital signs and FHR which are stable.

## 2011-10-13 NOTE — MAU Note (Signed)
Contractions x 3 hours. Denies bleeding or leaking fluid.

## 2011-10-14 DIAGNOSIS — O1002 Pre-existing essential hypertension complicating childbirth: Secondary | ICD-10-CM

## 2011-10-14 NOTE — Anesthesia Postprocedure Evaluation (Signed)
  Anesthesia Post-op Note  Patient: Brianna Hughes  Procedure(s) Performed: * No procedures listed *  Patient Location: Mother/Baby  Anesthesia Type: Epidural  Level of Consciousness: awake  Airway and Oxygen Therapy: Patient Spontanous Breathing  Post-op Pain: none  Post-op Assessment: Patient's Cardiovascular Status Stable, Respiratory Function Stable, Patent Airway, No signs of Nausea or vomiting, Adequate PO intake, Pain level controlled, No headache, No backache, No residual numbness and No residual motor weakness  Post-op Vital Signs: Reviewed and stable  Complications: No apparent anesthesia complications

## 2011-10-14 NOTE — Progress Notes (Signed)
UR chart review completed.  

## 2011-10-14 NOTE — Progress Notes (Signed)
Post Partum Day 1 Subjective: no complaints, up ad lib, voiding and tolerating PO  Objective: Blood pressure 131/83, pulse 65, temperature 98.8 F (37.1 C), temperature source Oral, resp. rate 18, height 5\' 5"  (1.651 m), weight 71.215 kg (157 lb), last menstrual period 12/09/2010, SpO2 100.00%, unknown if currently breastfeeding.  Physical Exam:  General: alert, cooperative and no distress Lochia: appropriate, scant Uterine Fundus: firm, -2 Incision: N/A DVT Evaluation: No evidence of DVT seen on physical exam. Negative Homan's sign. No cords or calf tenderness.   Basename 10/13/11 0757  HGB 12.3  HCT 36.0    Assessment/Plan: Plan for discharge tomorrow  Discussed early discharge with pt and may be interested this afternoon.   Infant may have to stay r/t feeding concerns   LOS: 1 day   LEFTWICH-KIRBY, Lakiya Cottam 10/14/2011, 6:57 AM

## 2011-10-14 NOTE — Clinical Social Work Maternal (Signed)
    Clinical Social Work Department PSYCHOSOCIAL ASSESSMENT - MATERNAL/CHILD 10/14/2011  Patient:  Brianna Hughes, Brianna Hughes  Account Number:  [id number]  Admit Date:  10/13/2011  Marjo Bicker Name:   Brianna Hughes    Clinical Social Worker:  Andy Gauss   Date/Time:  10/14/2011 01:54 PM  Date Referred:  10/14/2011   Referral source  CN     Referred reason  Substance Abuse   Other referral source:    I:  FAMILY / HOME ENVIRONMENT Child's legal guardian:  PARENT  Guardian - Name Guardian - Age Guardian - Address  Brianna Hughes [address]; [place], [place] [address]  Brianna Hughes 27 (same as above)   Other household support members/support persons Name Relationship DOB  Brianna Hughes DAUGHTER 01/06/09   Other support:   Parents    II  PSYCHOSOCIAL DATA Information Source:  Patient Interview  Insurance claims handler Resources Employment:   Financial resources:  [employer] If Medicaid - County:  [place] [employer] [employer] Other  Chemical engineer / Grade:   Maternity Care Coordinator / Child Services Coordination / Early Interventions:  Cultural issues impacting care:    III  STRENGTHS Strengths  Adequate Resources  Home prepared for Child (including basic supplies)  Supportive family/friends   Strength comment:    IV  RISK FACTORS AND CURRENT PROBLEMS Current Problem:  None   Risk Factor & Current Problem Patient Issue Family Issue Risk Factor / Current Problem Comment   Y N Hx of MJ use    V  SOCIAL WORK ASSESSMENT Sw met with pt to assess history of MJ & benzodiazepine use.  Pt denies any MJ use, in over one year.  While she admits to smoking MJ with during her first pregnancy, she denies any use since then.  Pt states she did take prescribed Xanax, prior to pregnancy but none during this pregnancy.   Pt admits to having an addiction to opiates, as she used "on and off for 4-5 years."  According to the pt, she started taking oxycodone for back pain before it became a problem.  She is currently enrolled in treatment at the [facility], where she is given, 1.5-2mg  of suboxone.  Pt aware about infants possible withdrawal symptoms.  She denies any illegal substance use.  UDS is negative, meconium results are pending.  She has all the necessary supplies for the infant.  Pt identified her parents as her primary support person.  Sw will continue to follow and assist further if needed.      VI SOCIAL WORK PLAN Social Work Plan  No Further Intervention Required / No Barriers to Discharge   Type of pt/family education:   If child protective services report - county:   If child protective services report - date:   Information/referral to community resources comment:   Other social work plan:

## 2011-10-15 MED ORDER — IBUPROFEN 600 MG PO TABS: 600.0000 mg | ORAL_TABLET | Freq: Four times a day (QID) | ORAL | Status: AC

## 2011-10-15 NOTE — Progress Notes (Signed)
Post Partum Day 2 Subjective: up ad lib, voiding, tolerating PO and + flatus Brianna Hughes seen this am and reports 4/10 intermittent cramping but feels pain is controlled with Rx. Last BM yesterday afternoon, flatus and urination this am. Pt has been ambulating down the hallways. Denies any dizziness. Mild lochia present on pad. Pt reports she is ready to go home.  Objective: Blood pressure 128/89, pulse 81, temperature 97.5 F (36.4 C), temperature source Oral, resp. rate 18, height 5\' 5"  (1.651 m), weight 71.215 kg (157 lb), last menstrual period 12/09/2010, SpO2 100.00%, unknown if currently breastfeeding.  Physical Exam:  General: alert, cooperative, distracted and no distress Lungs: CTA bil throughout, no adventitious sounds Heart: RRR, Good S1 and S2 ABD: distended, soft, BSx4 Lochia: appropriate - mild clear-, blood-tinged fluid Uterine Fundus: firm, below umbilicus Incision: N/A DVT Evaluation: Negative Homan's sign. No cords or calf tenderness. No significant calf/ankle edema.   Basename 10/13/11 0757  HGB 12.3  HCT 36.0    Assessment/Plan: Discharge Home Contraception - Nexplanon Ibuprofen 600 mg po q6h prn for pain   LOS: 2 days   Ricardo Jericho 10/15/2011, 10:00 AM

## 2011-10-15 NOTE — Discharge Summary (Signed)
Obstetric Discharge Summary Reason for Admission: onset of labor Prenatal Procedures: none Intrapartum Procedures: spontaneous vaginal delivery Postpartum Procedures: none Complications-Operative and Postpartum: labial laceration Hemoglobin  Date Value Range Status  10/13/2011 12.3  12.0-15.0 (g/dL) Final     HCT  Date Value Range Status  10/13/2011 36.0  36.0-46.0 (%) Final    Physical Exam:  General: alert, cooperative and no distress Lochia: appropriate Uterine Fundus: firm DVT Evaluation: No evidence of DVT seen on physical exam. Negative Homan's sign. No cords or calf tenderness. No significant calf/ankle edema.  Discharge Diagnoses: Term Pregnancy-delivered  Discharge Information: Date: 10/15/2011 Activity: pelvic rest Diet: routine Medications: PNV, Ibuprofen and suboxone Condition: improved Instructions: refer to practice specific booklet Discharge to: home Follow-up Information    Follow up with [facility] in 4 weeks.         Newborn Data: Live born female  Birth Weight: 5 lb 14.8 oz (2688 g) APGAR: 8, 9  Home with mother.  Brianna Hughes JEHIEL 10/15/2011, 9:46 AM

## 2011-10-15 NOTE — Discharge Instructions (Signed)
Vaginal Delivery Care After  Change your pad on each trip to the bathroom.   Wipe gently with toilet paper during your hospital stay. Always wipe from front to back. A spray bottle with warm tap water could also be used or a towelette if available.   Place your soiled pad and toilet paper in a bathroom wastebasket with a plastic bag liner.   During your hospital stay, save any clots. If you pass a clot while on the toilet, do not flush it. Also, if your vaginal flow seems excessive to you, notify nursing personnel.   The first time you get out of bed after delivery, wait for assistance from a nurse. Do not get up alone at any time if you feel weak or dizzy.   Bend and extend your ankles forcefully so that you feel the calves of your legs get hard. Do this 6 times every hour when you are in bed and awake.   Do not sit with one foot under you, dangle your legs over the edge of the bed, or maintain a position that hinders the circulation in your legs.   Many women experience after pains for 2 to 3 days after delivery. These after pains are mild uterine contractions. Ask the nurse for a pain medication if you need something for this. Sometimes breastfeeding stimulates after pains; if you find this to be true, ask for the medication  -  hour before the next feeding.   For you and your infant's protection, do not go beyond the door(s) of the obstetric unit. Do not carry your baby in your arms in the hallway. When taking your baby to and from your room, put your baby in the bassinet and push the bassinet.   Mothers may have their babies in their room as much as they desire.  Document Released: 04/23/2000 Document Revised: 04/15/2011 Document Reviewed: 03/24/2007 Exit[employer] Patient Information 2012 [employer]. 

## 2011-10-18 NOTE — H&P (Signed)
Agree with above note.  Brianna Hughes 10/18/2011 9:22 AM

## 2011-11-18 ENCOUNTER — Emergency Department (HOSPITAL_COMMUNITY)
Admission: EM | Admit: 2011-11-18 | Discharge: 2011-11-18 | Disposition: A | Discharge status: Home / Self Care | Payer: Medicaid Other | Attending: Emergency Medicine | Admitting: Emergency Medicine

## 2011-11-18 ENCOUNTER — Encounter (HOSPITAL_COMMUNITY): Payer: Self-pay | Admitting: *Deleted

## 2011-11-18 DIAGNOSIS — K029 Dental caries, unspecified: Secondary | ICD-10-CM | POA: Insufficient documentation

## 2011-11-18 DIAGNOSIS — F17200 Nicotine dependence, unspecified, uncomplicated: Secondary | ICD-10-CM | POA: Insufficient documentation

## 2011-11-18 DIAGNOSIS — K0889 Other specified disorders of teeth and supporting structures: Secondary | ICD-10-CM

## 2011-11-18 DIAGNOSIS — M4120 Other idiopathic scoliosis, site unspecified: Secondary | ICD-10-CM | POA: Insufficient documentation

## 2011-11-18 MED ORDER — OXYCODONE-ACETAMINOPHEN 5-325 MG PO TABS: ORAL_TABLET | ORAL | Status: DC

## 2011-11-18 MED ORDER — PENICILLIN V POTASSIUM 500 MG PO TABS: 500.0000 mg | ORAL_TABLET | Freq: Four times a day (QID) | ORAL | Status: AC

## 2011-11-18 NOTE — ED Provider Notes (Signed)
History     CSN: [id number]  Arrival date & time 11/18/11  1221   First MD Initiated Contact with Patient 11/18/11 1226      Chief Complaint  Patient presents with  . Dental Pain    (Consider location/radiation/quality/duration/timing/severity/associated sxs/prior treatment) HPI Comments: "i think i found a dentist".  Patient is a 26 y.o. female presenting with tooth pain. The history is provided by the patient. No language interpreter was used.  Dental PainThe primary symptoms include mouth pain. Primary symptoms do not include dental injury or fever. Episode onset: several months ago.  worse in past few days. The symptoms are unchanged. The symptoms occur constantly.  Additional symptoms include: dental sensitivity to temperature. Additional symptoms do not include: purulent gums, jaw pain, facial swelling and ear pain.    Past Medical History  Diagnosis Date  . Scoliosis   . Headache     Past Surgical History  Procedure Date  . Mouth surgery     Family History  Problem Relation Age of Onset  . Anesthesia problems Neg Hx   . Hypotension Neg Hx   . Malignant hyperthermia Neg Hx   . Pseudochol deficiency Neg Hx     History  Substance Use Topics  . Smoking status: Current Everyday Smoker -- 0.2 packs/day for 10 years    Types: Cigarettes  . Smokeless tobacco: Never Used  . Alcohol Use: No    OB History    Grav Para Term Preterm Abortions TAB SAB Ect Mult Living   2 2 2  0 0 0 0 0 0 2      Review of Systems  Constitutional: Negative for fever.  HENT: Positive for dental problem. Negative for ear pain and facial swelling.   All other systems reviewed and are negative.    Allergies  Review of patient's allergies indicates no known allergies.  Home Medications   Current Outpatient Rx  Name Route Sig Dispense Refill  . BUPRENORPHINE HCL-NALOXONE HCL 2-0.5 MG SL SUBL Sublingual Place 1 tablet under the tongue every morning.    Marland Kitchen CALCIUM CARBONATE ANTACID  500 MG PO CHEW Oral Chew 1 tablet by mouth daily. For acid reflux.    . OXYCODONE-ACETAMINOPHEN 5-325 MG PO TABS  One tab po QID prn pain 15 tablet 0  . PENICILLIN V POTASSIUM 500 MG PO TABS Oral Take 1 tablet (500 mg total) by mouth 4 (four) times daily. 40 tablet 0  . PRENATAL MULTIVITAMIN CH Oral Take 1 tablet by mouth at bedtime.      BP 132/85  Pulse 88  Temp 98.5 F (36.9 C) (Oral)  Resp 18  Ht 5\' 5"  (1.651 m)  Wt 130 lb (58.968 kg)  BMI 21.63 kg/m2  SpO2 98%  LMP 11/11/2011  Breastfeeding? No  Physical Exam  Nursing note and vitals reviewed. Constitutional: She is oriented to person, place, and time. She appears well-developed and well-nourished. No distress.  HENT:  Head: Normocephalic and atraumatic.  Mouth/Throat: Uvula is midline, oropharynx is clear and moist and mucous membranes are normal. Dental caries present. No dental abscesses or uvula swelling.    Eyes: EOM are normal.  Neck: Normal range of motion.  Cardiovascular: Normal rate, regular rhythm and normal heart sounds.   Pulmonary/Chest: Effort normal and breath sounds normal.  Abdominal: Soft. She exhibits no distension. There is no tenderness.  Musculoskeletal: Normal range of motion.  Neurological: She is alert and oriented to person, place, and time.  Skin: Skin is warm and dry.  Psychiatric: She has a normal mood and affect. Judgment normal.    ED Course  Procedures (including critical care time)  Labs Reviewed - No data to display No results found.   1. Pain, dental       MDM  rx-percocet, 15 rx-pen vk, 40 Ibuprofen 600 mg TID F/u with dentist of your choice.        Evalina Field, [place] 11/18/11 1314

## 2011-11-18 NOTE — ED Notes (Signed)
Patient with no complaints at this time. Respirations even and unlabored. Skin warm/dry. Discharge instructions reviewed with patient at this time. Patient given opportunity to voice concerns/ask questions. Patient discharged at this time and left Emergency Department with steady gait.   

## 2011-11-18 NOTE — ED Notes (Signed)
Pain from upper rt molar, says part of tooth is gone, says tooth ache is causing a migraine. And low back pain

## 2011-11-19 NOTE — ED Provider Notes (Signed)
Medical screening examination/treatment/procedure(s) were performed by non-physician practitioner and as supervising physician I was immediately available for consultation/collaboration.  Ethelle Ola R. Coriana Angello, MD 11/19/11 1457 

## 2011-11-23 ENCOUNTER — Encounter (HOSPITAL_COMMUNITY): Payer: Self-pay | Admitting: Emergency Medicine

## 2011-11-23 ENCOUNTER — Emergency Department (HOSPITAL_COMMUNITY)
Admission: EM | Admit: 2011-11-23 | Discharge: 2011-11-23 | Disposition: A | Discharge status: Home / Self Care | Payer: Medicaid Other | Attending: Emergency Medicine | Admitting: Emergency Medicine

## 2011-11-23 DIAGNOSIS — K047 Periapical abscess without sinus: Secondary | ICD-10-CM | POA: Insufficient documentation

## 2011-11-23 DIAGNOSIS — G8929 Other chronic pain: Secondary | ICD-10-CM

## 2011-11-23 DIAGNOSIS — F17200 Nicotine dependence, unspecified, uncomplicated: Secondary | ICD-10-CM | POA: Insufficient documentation

## 2011-11-23 DIAGNOSIS — M4120 Other idiopathic scoliosis, site unspecified: Secondary | ICD-10-CM | POA: Insufficient documentation

## 2011-11-23 MED ORDER — IBUPROFEN 600 MG PO TABS: 600.0000 mg | ORAL_TABLET | Freq: Three times a day (TID) | ORAL | Status: AC | PRN

## 2011-11-23 MED ORDER — OXYCODONE-ACETAMINOPHEN 5-325 MG PO TABS: 1.0000 | ORAL_TABLET | ORAL | Status: DC | PRN

## 2011-11-23 NOTE — ED Notes (Signed)
Patient with no complaints at this time. Respirations even and unlabored. Skin warm/dry. Discharge instructions reviewed with patient at this time. Patient given opportunity to voice concerns/ask questions. Patient discharged at this time and left Emergency Department with steady gait.   

## 2011-11-23 NOTE — ED Provider Notes (Signed)
.  Medical screening examination/treatment/procedure(s) were performed by non-physician practitioner and as supervising physician I was immediately available for consultation/collaboration. Devoria Albe, MD, Armando Gang   Ward Givens, MD 11/23/11 [phone]

## 2011-11-23 NOTE — ED Provider Notes (Signed)
History     CSN: [id number]  Arrival date & time 11/23/11  1717   First MD Initiated Contact with Patient 11/23/11 1725      Chief Complaint  Patient presents with  . Dental Pain    (Consider location/radiation/quality/duration/timing/severity/associated sxs/prior treatment) HPI Comments: Brianna Hughes  presents with a 2 week history of dental pain and gingival swelling.   The patient has a history of injury to the teeth involved including deep cavities and partial fractures of her posterior bilateral second molar teeth.  She is scheduled to see Dr. Blondell Reveal early August,  But was told she may also need to see an oral surgeon to have her wisdom teeth pulled.  There has been no fevers,  Chills, nausea or vomiting, also no complaint of difficulty swallowing,  Although chewing makes pain worse.  The patient is currently taking penicillin (day 5) and also was prescribed percocet(here) but has run out.    Patient is a 26 y.o. female presenting with tooth pain. The history is provided by the patient.  Dental PainPrimary symptoms do not include fever, shortness of breath or sore throat.  Additional symptoms do not include: facial swelling.    Past Medical History  Diagnosis Date  . Scoliosis   . Headache     Past Surgical History  Procedure Date  . Mouth surgery     Family History  Problem Relation Age of Onset  . Anesthesia problems Neg Hx   . Hypotension Neg Hx   . Malignant hyperthermia Neg Hx   . Pseudochol deficiency Neg Hx     History  Substance Use Topics  . Smoking status: Current Everyday Smoker -- 0.2 packs/day for 10 years    Types: Cigarettes  . Smokeless tobacco: Never Used  . Alcohol Use: No    OB History    Grav Para Term Preterm Abortions TAB SAB Ect Mult Living   2 2 2  0 0 0 0 0 0 2      Review of Systems  Constitutional: Negative for fever.  HENT: Positive for dental problem. Negative for sore throat, facial swelling, neck pain and neck stiffness.    Respiratory: Negative for shortness of breath.     Allergies  Review of patient's allergies indicates no known allergies.  Home Medications   Current Outpatient Rx  Name Route Sig Dispense Refill  . BUPRENORPHINE HCL-NALOXONE HCL 2-0.5 MG SL SUBL Sublingual Place 1 tablet under the tongue every morning.    Marland Kitchen CALCIUM CARBONATE ANTACID 500 MG PO CHEW Oral Chew 1 tablet by mouth daily. For acid reflux.    . IBUPROFEN 600 MG PO TABS Oral Take 1 tablet (600 mg total) by mouth every 8 (eight) hours as needed for pain. 21 tablet 0  . OXYCODONE-ACETAMINOPHEN 5-325 MG PO TABS  One tab po QID prn pain 15 tablet 0  . OXYCODONE-ACETAMINOPHEN 5-325 MG PO TABS Oral Take 1 tablet by mouth every 4 (four) hours as needed for pain. 10 tablet 0  . PENICILLIN V POTASSIUM 500 MG PO TABS Oral Take 1 tablet (500 mg total) by mouth 4 (four) times daily. 40 tablet 0  . PRENATAL MULTIVITAMIN CH Oral Take 1 tablet by mouth at bedtime.      BP 137/93  Pulse 76  Temp 98.6 F (37 C)  Resp 18  Ht 5\' 5"  (1.651 m)  Wt 143 lb (64.864 kg)  BMI 23.80 kg/m2  SpO2 100%  LMP 11/23/2011  Physical Exam  Constitutional: She  is oriented to person, place, and time. She appears well-developed and well-nourished. No distress.  HENT:  Head: Normocephalic and atraumatic.  Right Ear: Tympanic membrane, external ear and ear canal normal.  Left Ear: Tympanic membrane, external ear and ear canal normal.  Mouth/Throat: Oropharynx is clear and moist and mucous membranes are normal. No oral lesions. Dental abscesses present.    Eyes: Conjunctivae are normal.  Neck: Normal range of motion. Neck supple.  Cardiovascular: Normal rate and normal heart sounds.   Pulmonary/Chest: Effort normal.  Musculoskeletal: Normal range of motion.  Lymphadenopathy:    She has no cervical adenopathy.  Neurological: She is alert and oriented to person, place, and time.  Skin: Skin is warm and dry. No erythema.  Psychiatric: She has a normal  mood and affect.    ED Course  Procedures (including critical care time)  Labs Reviewed - No data to display No results found.   1. Chronic dental pain       MDM  Pt encouraged to finish pcn and f/u with her dentist.  Small quantity of percocet prescribed.  Ibuprofen 600 mg tid.        Burgess Amor, PA 11/23/11 2002

## 2011-11-23 NOTE — ED Notes (Signed)
Pt c/o bilateral upper toothache worse on right side x 2 weeks.

## 2011-12-01 ENCOUNTER — Emergency Department (HOSPITAL_COMMUNITY)
Admission: EM | Admit: 2011-12-01 | Discharge: 2011-12-01 | Disposition: A | Discharge status: Home / Self Care | Payer: Medicaid Other | Attending: Emergency Medicine | Admitting: Emergency Medicine

## 2011-12-01 ENCOUNTER — Emergency Department (HOSPITAL_COMMUNITY): Payer: Medicaid Other

## 2011-12-01 ENCOUNTER — Encounter (HOSPITAL_COMMUNITY): Payer: Self-pay | Admitting: *Deleted

## 2011-12-01 DIAGNOSIS — F17200 Nicotine dependence, unspecified, uncomplicated: Secondary | ICD-10-CM | POA: Insufficient documentation

## 2011-12-01 DIAGNOSIS — Y9302 Activity, running: Secondary | ICD-10-CM | POA: Insufficient documentation

## 2011-12-01 DIAGNOSIS — S93409A Sprain of unspecified ligament of unspecified ankle, initial encounter: Secondary | ICD-10-CM | POA: Insufficient documentation

## 2011-12-01 DIAGNOSIS — M4120 Other idiopathic scoliosis, site unspecified: Secondary | ICD-10-CM | POA: Insufficient documentation

## 2011-12-01 DIAGNOSIS — Y92009 Unspecified place in unspecified non-institutional (private) residence as the place of occurrence of the external cause: Secondary | ICD-10-CM | POA: Insufficient documentation

## 2011-12-01 DIAGNOSIS — Y998 Other external cause status: Secondary | ICD-10-CM | POA: Insufficient documentation

## 2011-12-01 DIAGNOSIS — S93401A Sprain of unspecified ligament of right ankle, initial encounter: Secondary | ICD-10-CM

## 2011-12-01 DIAGNOSIS — X58XXXA Exposure to other specified factors, initial encounter: Secondary | ICD-10-CM | POA: Insufficient documentation

## 2011-12-01 MED ORDER — OXYCODONE-ACETAMINOPHEN 5-325 MG PO TABS: ORAL_TABLET | ORAL | Status: DC

## 2011-12-01 NOTE — ED Provider Notes (Signed)
Medical screening examination/treatment/procedure(s) were performed by non-physician practitioner and as supervising physician I was immediately available for consultation/collaboration.   Makya Yurko M Adel Burch, DO 12/01/11 1923 

## 2011-12-01 NOTE — ED Notes (Signed)
Pain rt ankle after running and tripping

## 2011-12-01 NOTE — ED Provider Notes (Signed)
History     CSN: [id number]  Arrival date & time 12/01/11  1423   None     Chief Complaint  Patient presents with  . Ankle Pain    (Consider location/radiation/quality/duration/timing/severity/associated sxs/prior treatment) HPI Comments: No other injuries or complaints.  Patient is a 26 y.o. female presenting with ankle pain. The history is provided by the patient. No language interpreter was used.  Ankle Pain  Incident onset: 2 days ago. The incident occurred at home. Injury mechanism: running and stepped on wood that border the sidewalk everting R  ankle.  no other injuries. Pertinent negatives include no numbness. The symptoms are aggravated by bearing weight. Treatments tried: using crutches.    Past Medical History  Diagnosis Date  . Scoliosis   . Headache     Past Surgical History  Procedure Date  . Mouth surgery     Family History  Problem Relation Age of Onset  . Anesthesia problems Neg Hx   . Hypotension Neg Hx   . Malignant hyperthermia Neg Hx   . Pseudochol deficiency Neg Hx     History  Substance Use Topics  . Smoking status: Current Everyday Smoker -- 0.2 packs/day for 10 years    Types: Cigarettes  . Smokeless tobacco: Never Used  . Alcohol Use: No    OB History    Grav Para Term Preterm Abortions TAB SAB Ect Mult Living   2 2 2  0 0 0 0 0 0 2      Review of Systems  Musculoskeletal:       R ankle pain.  Neurological: Negative for weakness and numbness.  All other systems reviewed and are negative.    Allergies  Review of patient's allergies indicates no known allergies.  Home Medications   Current Outpatient Rx  Name Route Sig Dispense Refill  . BUPRENORPHINE HCL-NALOXONE HCL 2-0.5 MG SL SUBL Sublingual Place 1 tablet under the tongue every morning.    Marland Kitchen CALCIUM CARBONATE ANTACID 500 MG PO CHEW Oral Chew 1 tablet by mouth daily. For acid reflux.    . IBUPROFEN 600 MG PO TABS Oral Take 1 tablet (600 mg total) by mouth every 8  (eight) hours as needed for pain. 21 tablet 0  . OXYCODONE-ACETAMINOPHEN 5-325 MG PO TABS  One tab po QID prn pain 15 tablet 0  . OXYCODONE-ACETAMINOPHEN 5-325 MG PO TABS Oral Take 1 tablet by mouth every 4 (four) hours as needed for pain. 10 tablet 0  . PRENATAL MULTIVITAMIN CH Oral Take 1 tablet by mouth at bedtime.      BP 125/94  Pulse 86  Temp 98.2 F (36.8 C) (Oral)  Resp 20  Ht 5\' 5"  (1.651 m)  Wt 143 lb (64.864 kg)  BMI 23.80 kg/m2  SpO2 100%  LMP 11/23/2011  Physical Exam  Nursing note and vitals reviewed. Constitutional: She is oriented to person, place, and time. She appears well-developed and well-nourished. No distress.  HENT:  Head: Normocephalic and atraumatic.  Eyes: EOM are normal.  Neck: Normal range of motion.  Cardiovascular: Normal rate, regular rhythm and normal heart sounds.   Pulmonary/Chest: Effort normal and breath sounds normal.  Abdominal: Soft. She exhibits no distension. There is no tenderness.  Musculoskeletal:       Right ankle: She exhibits decreased range of motion. She exhibits no swelling, no ecchymosis, no deformity, no laceration and normal pulse.       Feet:  Neurological: She is alert and oriented to person, place, and  time. Coordination normal.  Skin: Skin is warm and dry.  Psychiatric: She has a normal mood and affect. Judgment normal.    ED Course  Procedures (including critical care time)  Labs Reviewed - No data to display Dg Ankle Complete Right  12/01/2011  *RADIOLOGY REPORT*  Clinical Data:. Pain and swelling greatest laterally, injury when stepped off the sidewalk inverting ankle  RIGHT ANKLE - COMPLETE 3+ VIEW  Comparison: None  Findings: Osseous mineralization normal. Ankle mortise intact. Bony excrescence identified from the anteromedial margin of the distal fibular metadiaphysis, approximate 3.1 cm in length, demonstrating contiguity with the underlying marrow of the distal fibula, most consistent with a large  osteochondroma. This causes mild secondary deformity of the lateral margin of the distal tibial metadiaphysis. No acute fracture, dislocation, or bone destruction.  IMPRESSION: Large osteochondroma arising from the anteromedial margin of the distal right fibula. No radiographic evidence of acute injury.  Original Report Authenticated By: Lollie Marrow, M.D.     1. Right ankle sprain       MDM  Has crutches ASO splint Ice OTC ibuprofen F/u with dr. Hilda Lias prn.        Evalina Field, [place] 12/01/11 1524

## 2011-12-01 NOTE — ED Notes (Signed)
Patient requests med for pain, states she had 2 percocet left from a visit for dental problems, states she cannot take hydrocodone

## 2012-03-06 ENCOUNTER — Encounter (HOSPITAL_COMMUNITY): Payer: Self-pay | Admitting: *Deleted

## 2012-03-06 ENCOUNTER — Emergency Department (HOSPITAL_COMMUNITY)
Admission: EM | Admit: 2012-03-06 | Discharge: 2012-03-06 | Disposition: A | Discharge status: Home / Self Care | Payer: Self-pay | Attending: Emergency Medicine | Admitting: Emergency Medicine

## 2012-03-06 DIAGNOSIS — M25579 Pain in unspecified ankle and joints of unspecified foot: Secondary | ICD-10-CM | POA: Insufficient documentation

## 2012-03-06 DIAGNOSIS — Z7982 Long term (current) use of aspirin: Secondary | ICD-10-CM | POA: Insufficient documentation

## 2012-03-06 DIAGNOSIS — M545 Low back pain, unspecified: Secondary | ICD-10-CM | POA: Insufficient documentation

## 2012-03-06 DIAGNOSIS — F17200 Nicotine dependence, unspecified, uncomplicated: Secondary | ICD-10-CM | POA: Insufficient documentation

## 2012-03-06 DIAGNOSIS — G8929 Other chronic pain: Secondary | ICD-10-CM

## 2012-03-06 DIAGNOSIS — M4120 Other idiopathic scoliosis, site unspecified: Secondary | ICD-10-CM | POA: Insufficient documentation

## 2012-03-06 DIAGNOSIS — Z79899 Other long term (current) drug therapy: Secondary | ICD-10-CM | POA: Insufficient documentation

## 2012-03-06 MED ORDER — OXYCODONE-ACETAMINOPHEN 5-325 MG PO TABS: ORAL_TABLET | ORAL | Status: DC

## 2012-03-06 NOTE — ED Notes (Signed)
Pain low back for 10 years, and pain both  Ankles for 2 years.  Says she has a benign tumor  Rt ankle but has not been seen recently due to insurance problems.  Had been seeing Dr Hilda Lias.

## 2012-03-06 NOTE — ED Provider Notes (Signed)
Medical screening examination/treatment/procedure(s) were performed by non-physician practitioner and as supervising physician I was immediately available for consultation/collaboration.  Shelda Jakes, MD 03/06/12 1430

## 2012-03-06 NOTE — ED Provider Notes (Signed)
History     CSN: [id number]  Arrival date & time 03/06/12  1241   First MD Initiated Contact with Patient 03/06/12 1325      Chief Complaint  Patient presents with  . Ankle Pain    (Consider location/radiation/quality/duration/timing/severity/associated sxs/prior treatment) HPI Comments: Pt states she was seen here ~ 2 months ago after twisting her R ankle.  She sees dr. Hilda Lias for that and is being referred to [facility] for possible excision of a benign bone tumor in the area.  She states she really came today for pain medicine b/c she has been having back pain for over 10 years.  She has known scoliosis. Dx on MRI.  She will soon regain her insurance and plans to see dr. Sherwood Gambler, her PCP and dr. Hilda Lias.  The history is provided by the patient. No language interpreter was used.    Past Medical History  Diagnosis Date  . Scoliosis   . Headache     Past Surgical History  Procedure Date  . Mouth surgery     Family History  Problem Relation Age of Onset  . Anesthesia problems Neg Hx   . Hypotension Neg Hx   . Malignant hyperthermia Neg Hx   . Pseudochol deficiency Neg Hx     History  Substance Use Topics  . Smoking status: Current Every Day Smoker -- 0.2 packs/day for 10 years    Types: Cigarettes  . Smokeless tobacco: Never Used  . Alcohol Use: No    OB History    Grav Para Term Preterm Abortions TAB SAB Ect Mult Living   2 2 2  0 0 0 0 0 0 2      Review of Systems  Constitutional: Negative for fever and chills.  Musculoskeletal: Positive for back pain.  Neurological: Negative for weakness and numbness.  All other systems reviewed and are negative.    Allergies  Hydrocodone  Home Medications   Current Outpatient Rx  Name Route Sig Dispense Refill  . ASPIRIN-SALICYLAMIDE-CAFFEINE 325-95-16 MG PO TABS Oral Take 1 packet by mouth every 4 (four) hours as needed. Pain    . ETONOGESTREL 68 MG [place] IMPL Subcutaneous Inject 1 each into the skin once.    .  OXYCODONE-ACETAMINOPHEN 5-325 MG PO TABS  One tab po TID prn  pain 20 tablet 0    BP 124/90  Pulse 90  Temp 98.2 F (36.8 C) (Oral)  Resp 20  Ht 5\' 5"  (1.651 m)  Wt 130 lb (58.968 kg)  BMI 21.63 kg/m2  SpO2 100%  Breastfeeding? No  Physical Exam  Nursing note and vitals reviewed. Constitutional: She is oriented to person, place, and time. She appears well-developed and well-nourished. No distress.  HENT:  Head: Normocephalic and atraumatic.  Eyes: EOM are normal.  Neck: Normal range of motion.  Cardiovascular: Normal rate, regular rhythm and normal heart sounds.   Pulmonary/Chest: Effort normal and breath sounds normal.  Abdominal: Soft. She exhibits no distension. There is no tenderness.  Musculoskeletal: She exhibits tenderness.       Right ankle: She exhibits normal range of motion, no swelling, no ecchymosis and normal pulse.       Back:       Diffuse R ankle pain.  Ambulates with no difficulty.  Neurological: She is alert and oriented to person, place, and time.  Skin: Skin is warm and dry.  Psychiatric: She has a normal mood and affect. Judgment normal.    ED Course  Procedures (including critical care  time)  Labs Reviewed - No data to display No results found.   1. Chronic back pain       MDM  rx-percocet, 20 F/u with dr. Hilda Lias and fusco        Evalina Field, PA 03/06/12 1428

## 2012-03-18 ENCOUNTER — Emergency Department (HOSPITAL_COMMUNITY)
Admission: EM | Admit: 2012-03-18 | Discharge: 2012-03-18 | Disposition: A | Discharge status: Home / Self Care | Payer: Self-pay | Attending: Emergency Medicine | Admitting: Emergency Medicine

## 2012-03-18 ENCOUNTER — Encounter (HOSPITAL_COMMUNITY): Payer: Self-pay | Admitting: *Deleted

## 2012-03-18 DIAGNOSIS — Z79899 Other long term (current) drug therapy: Secondary | ICD-10-CM | POA: Insufficient documentation

## 2012-03-18 DIAGNOSIS — F411 Generalized anxiety disorder: Secondary | ICD-10-CM | POA: Insufficient documentation

## 2012-03-18 DIAGNOSIS — M25539 Pain in unspecified wrist: Secondary | ICD-10-CM | POA: Insufficient documentation

## 2012-03-18 DIAGNOSIS — F3289 Other specified depressive episodes: Secondary | ICD-10-CM | POA: Insufficient documentation

## 2012-03-18 DIAGNOSIS — G8929 Other chronic pain: Secondary | ICD-10-CM | POA: Insufficient documentation

## 2012-03-18 DIAGNOSIS — F329 Major depressive disorder, single episode, unspecified: Secondary | ICD-10-CM | POA: Insufficient documentation

## 2012-03-18 DIAGNOSIS — M25579 Pain in unspecified ankle and joints of unspecified foot: Secondary | ICD-10-CM | POA: Insufficient documentation

## 2012-03-18 DIAGNOSIS — F17200 Nicotine dependence, unspecified, uncomplicated: Secondary | ICD-10-CM | POA: Insufficient documentation

## 2012-03-18 HISTORY — DX: Anxiety disorder, unspecified: F41.9

## 2012-03-18 HISTORY — DX: Major depressive disorder, single episode, unspecified: F32.9

## 2012-03-18 HISTORY — DX: Depression, unspecified: F32.A

## 2012-03-18 MED ORDER — NAPROXEN 500 MG PO TABS: 500.0000 mg | ORAL_TABLET | Freq: Two times a day (BID) | ORAL | Status: DC

## 2012-03-18 MED ORDER — OXYCODONE-ACETAMINOPHEN 5-325 MG PO TABS
1.0000 | ORAL_TABLET | Freq: Once | ORAL | Status: AC
  Administered 2012-03-18: 1 via ORAL
  Filled 2012-03-18: qty 1

## 2012-03-18 NOTE — ED Notes (Signed)
Pt seen and evaluated by EDPa for initial assessment. 

## 2012-03-18 NOTE — ED Provider Notes (Signed)
Medical screening examination/treatment/procedure(s) were performed by non-physician practitioner and as supervising physician I was immediately available for consultation/collaboration.  Kamelia Lampkins, MD 03/18/12 2121 

## 2012-03-18 NOTE — ED Notes (Signed)
Pt states cyst to right wrist and tumor to right ankle. Seen here two weeks ago and Rx percocet and is now out of med. Was seeing Dr. Hilda Lias previously/

## 2012-03-18 NOTE — ED Provider Notes (Signed)
History     CSN: [id number]  Arrival date & time 03/18/12  1807   First MD Initiated Contact with Patient 03/18/12 1836      Chief Complaint  Patient presents with  . Pain    (Consider location/radiation/quality/duration/timing/severity/associated sxs/prior treatment) HPI Comments: Patient with hx of chronic pain to her right ankle and right wrist c/o continued pain and is out of her pain medication for several days.  She was seen here on 03/06/12 for ankle pain and given a narcotic.  States that she was seeing Dr. Hilda Lias for a "bone tumor" in her ankle and a ganglion cyst to her wrist.  States she has recently lost her Medicaid and cannot see Dr. Hilda Lias again until its reinstated.  She denies new injury, swelling or discoloration to the wrist or ankle.    The history is provided by the patient.    Past Medical History  Diagnosis Date  . Scoliosis   . Headache   . Anxiety   . Depression     Past Surgical History  Procedure Date  . Mouth surgery     Family History  Problem Relation Age of Onset  . Anesthesia problems Neg Hx   . Hypotension Neg Hx   . Malignant hyperthermia Neg Hx   . Pseudochol deficiency Neg Hx     History  Substance Use Topics  . Smoking status: Current Every Day Smoker -- 0.2 packs/day for 10 years    Types: Cigarettes  . Smokeless tobacco: Never Used  . Alcohol Use: No    OB History    Grav Para Term Preterm Abortions TAB SAB Ect Mult Living   2 2 2  0 0 0 0 0 0 2      Review of Systems  Constitutional: Negative for fever and chills.  Genitourinary: Negative for dysuria and difficulty urinating.  Musculoskeletal: Positive for joint swelling and arthralgias. Negative for back pain and gait problem.  Skin: Negative for color change and wound.  All other systems reviewed and are negative.    Allergies  Hydrocodone  Home Medications   Current Outpatient Rx  Name  Route  Sig  Dispense  Refill  . ASPIRIN-SALICYLAMIDE-CAFFEINE  325-95-16 MG PO TABS   Oral   Take 1 packet by mouth every 4 (four) hours as needed. Pain         . ETONOGESTREL 68 MG [place] IMPL   Subcutaneous   Inject 1 each into the skin once.         . OXYCODONE-ACETAMINOPHEN 5-325 MG PO TABS      One tab po TID prn  pain   20 tablet   0     BP 145/105  Pulse 87  Temp 98.8 F (37.1 C) (Oral)  Resp 16  Ht 5\' 5"  (1.651 m)  Wt 130 lb (58.968 kg)  BMI 21.63 kg/m2  SpO2 100%  Physical Exam  Nursing note and vitals reviewed. Constitutional: She is oriented to person, place, and time. She appears well-developed and well-nourished. No distress.  HENT:  Head: Normocephalic and atraumatic.  Cardiovascular: Normal rate, regular rhythm, normal heart sounds and intact distal pulses.   Pulmonary/Chest: Effort normal and breath sounds normal.  Musculoskeletal: She exhibits tenderness.       Right wrist: She exhibits tenderness. She exhibits normal range of motion, no bony tenderness, no swelling, no effusion, no crepitus, no deformity and no laceration.       Right ankle: She exhibits normal range of motion, no  swelling, no ecchymosis, no deformity, no laceration and normal pulse. tenderness. Lateral malleolus tenderness found. No head of 5th metatarsal and no proximal fibula tenderness found. Achilles tendon normal.       Right ankle is diffusely ttp, no edema.  ROM is preserved.  DP pulse is brisk, sensation intact.  No erythema, abrasion, bruising or deformity.  Also has ttp of the dorsal right wrist with a small palpable nodule present.  Distal sensation intact, radial pulse brisk, CR< 2 sec  Neurological: She is alert and oriented to person, place, and time. She exhibits normal muscle tone. Coordination normal.  Skin: Skin is warm and dry.    ED Course  Procedures (including critical care time)  Labs Reviewed - No data to display No results found.   velcro wrist splint applied.  Pain improved, remains NV intact.     MDM    Previous  ED charts and x-ray were reviewed.  Pt has multiple ED visits for pain.  Seen here on 03/06/12 for ankle pain.  Advised pt that she will need further pain management with her PMD or Dr. Hilda Lias, pt verbalized understanding.   Pt reviewed on [place] narcotics database.  Multiple hydrocodone Rx's.    Prescribed: naprosyn   Ramsey Guadamuz L. [place], [place] 03/18/12 1913

## 2012-03-27 ENCOUNTER — Emergency Department (HOSPITAL_COMMUNITY)
Admission: EM | Admit: 2012-03-27 | Discharge: 2012-03-27 | Disposition: A | Discharge status: Home / Self Care | Payer: Self-pay | Attending: Emergency Medicine | Admitting: Emergency Medicine

## 2012-03-27 ENCOUNTER — Encounter (HOSPITAL_COMMUNITY): Payer: Self-pay

## 2012-03-27 DIAGNOSIS — Z79899 Other long term (current) drug therapy: Secondary | ICD-10-CM | POA: Insufficient documentation

## 2012-03-27 DIAGNOSIS — M4120 Other idiopathic scoliosis, site unspecified: Secondary | ICD-10-CM | POA: Insufficient documentation

## 2012-03-27 DIAGNOSIS — X500XXA Overexertion from strenuous movement or load, initial encounter: Secondary | ICD-10-CM | POA: Insufficient documentation

## 2012-03-27 DIAGNOSIS — F17200 Nicotine dependence, unspecified, uncomplicated: Secondary | ICD-10-CM | POA: Insufficient documentation

## 2012-03-27 DIAGNOSIS — H53149 Visual discomfort, unspecified: Secondary | ICD-10-CM | POA: Insufficient documentation

## 2012-03-27 DIAGNOSIS — M549 Dorsalgia, unspecified: Secondary | ICD-10-CM

## 2012-03-27 DIAGNOSIS — N390 Urinary tract infection, site not specified: Secondary | ICD-10-CM | POA: Insufficient documentation

## 2012-03-27 DIAGNOSIS — Y929 Unspecified place or not applicable: Secondary | ICD-10-CM | POA: Insufficient documentation

## 2012-03-27 DIAGNOSIS — Y9389 Activity, other specified: Secondary | ICD-10-CM | POA: Insufficient documentation

## 2012-03-27 DIAGNOSIS — Z8659 Personal history of other mental and behavioral disorders: Secondary | ICD-10-CM | POA: Insufficient documentation

## 2012-03-27 DIAGNOSIS — R110 Nausea: Secondary | ICD-10-CM | POA: Insufficient documentation

## 2012-03-27 DIAGNOSIS — IMO0002 Reserved for concepts with insufficient information to code with codable children: Secondary | ICD-10-CM | POA: Insufficient documentation

## 2012-03-27 DIAGNOSIS — R51 Headache: Secondary | ICD-10-CM

## 2012-03-27 LAB — URINE MICROSCOPIC-ADD ON

## 2012-03-27 LAB — URINALYSIS, ROUTINE W REFLEX MICROSCOPIC
Bilirubin Urine: NEGATIVE
Glucose, UA: NEGATIVE mg/dL
Ketones, ur: NEGATIVE mg/dL
Protein, ur: NEGATIVE mg/dL
pH: 6 (ref 5.0–8.0)

## 2012-03-27 MED ORDER — TRAMADOL HCL 50 MG PO TABS: 50.0000 mg | ORAL_TABLET | Freq: Four times a day (QID) | ORAL | Status: DC | PRN

## 2012-03-27 MED ORDER — IBUPROFEN 800 MG PO TABS: 800.0000 mg | ORAL_TABLET | Freq: Three times a day (TID) | ORAL | Status: DC

## 2012-03-27 MED ORDER — CYCLOBENZAPRINE HCL 10 MG PO TABS: 10.0000 mg | ORAL_TABLET | Freq: Three times a day (TID) | ORAL | Status: DC | PRN

## 2012-03-27 MED ORDER — METOCLOPRAMIDE HCL 5 MG/ML IJ SOLN
10.0000 mg | Freq: Once | INTRAMUSCULAR | Status: AC
  Administered 2012-03-27: 10 mg via INTRAMUSCULAR
  Filled 2012-03-27: qty 2

## 2012-03-27 MED ORDER — KETOROLAC TROMETHAMINE 60 MG/2ML IM SOLN
60.0000 mg | Freq: Once | INTRAMUSCULAR | Status: AC
  Administered 2012-03-27: 60 mg via INTRAMUSCULAR
  Filled 2012-03-27: qty 2

## 2012-03-27 MED ORDER — CEPHALEXIN 500 MG PO CAPS: 500.0000 mg | ORAL_CAPSULE | Freq: Four times a day (QID) | ORAL | Status: DC

## 2012-03-27 MED ORDER — DIPHENHYDRAMINE HCL 25 MG PO CAPS
25.0000 mg | ORAL_CAPSULE | Freq: Once | ORAL | Status: AC
  Administered 2012-03-27: 25 mg via ORAL
  Filled 2012-03-27: qty 1

## 2012-03-27 MED ORDER — CEPHALEXIN 500 MG PO CAPS
500.0000 mg | ORAL_CAPSULE | Freq: Once | ORAL | Status: AC
  Administered 2012-03-27: 500 mg via ORAL
  Filled 2012-03-27: qty 1

## 2012-03-27 NOTE — ED Notes (Signed)
Pt c/o back pain and migraine since yesterday.  Reports was stacking some wood 2 days ago.  Pt c/o nausea, no vomiting.  Reports history of migraines.

## 2012-03-27 NOTE — ED Provider Notes (Signed)
History     CSN: [id number]  Arrival date & time 03/27/12  1013   First MD Initiated Contact with Patient 03/27/12 1137      Chief Complaint  Patient presents with  . Back Pain  . Headache    (Consider location/radiation/quality/duration/timing/severity/associated sxs/prior treatment) HPI Comments: Patient c/o frontal headache of gradual onset and low back pain that began two days ago.  States the back pain began after stacking wood.  States she believes she "pulled a muscle".  Pain to her back is worse with certain movements and improves with rest.  Has a hx of chronic back pain secondary to scoliosis.  She denies fever, neck pain or stiffness, vomiting, visual change, dizziness, incontinence, dysuria, saddle anesthesia's or LE weakness or numbness  Patient is a 26 y.o. female presenting with headaches. The history is provided by the patient.  Headache  This is a recurrent problem. Episode onset: day prior to ed arrival. The problem occurs constantly. The problem has been gradually worsening. The headache is associated with bright light and loud noise. The pain is located in the bilateral and frontal region. The quality of the pain is described as throbbing and dull. The pain is mild. The pain does not radiate. Associated symptoms include nausea. Pertinent negatives include no anorexia, no fever, no malaise/fatigue, no chest pressure, no near-syncope, no palpitations, no syncope, no shortness of breath and no vomiting. She has tried nothing for the symptoms. The treatment provided no relief.    Past Medical History  Diagnosis Date  . Scoliosis   . Headache   . Anxiety   . Depression     Past Surgical History  Procedure Date  . Mouth surgery     Family History  Problem Relation Age of Onset  . Anesthesia problems Neg Hx   . Hypotension Neg Hx   . Malignant hyperthermia Neg Hx   . Pseudochol deficiency Neg Hx     History  Substance Use Topics  . Smoking status: Current  Every Day Smoker -- 0.2 packs/day for 10 years    Types: Cigarettes  . Smokeless tobacco: Never Used  . Alcohol Use: No    OB History    Grav Para Term Preterm Abortions TAB SAB Ect Mult Living   2 2 2  0 0 0 0 0 0 2      Review of Systems  Constitutional: Negative for fever, malaise/fatigue, activity change and appetite change.  HENT: Negative for facial swelling, trouble swallowing, neck pain, neck stiffness and sinus pressure.   Eyes: Positive for photophobia. Negative for pain and visual disturbance.  Respiratory: Negative for shortness of breath.   Cardiovascular: Negative for chest pain, palpitations, syncope and near-syncope.  Gastrointestinal: Positive for nausea. Negative for vomiting, abdominal pain, constipation and anorexia.  Genitourinary: Negative for dysuria, hematuria, flank pain, decreased urine volume and difficulty urinating.       No perineal numbness or incontinence of urine or feces  Musculoskeletal: Positive for back pain. Negative for joint swelling.  Skin: Negative for rash and wound.  Neurological: Positive for headaches. Negative for dizziness, facial asymmetry, speech difficulty, weakness and numbness.  Psychiatric/Behavioral: Negative for confusion and decreased concentration.  All other systems reviewed and are negative.    Allergies  Hydrocodone  Home Medications   Current Outpatient Rx  Name  Route  Sig  Dispense  Refill  . ASPIRIN-SALICYLAMIDE-CAFFEINE 325-95-16 MG PO TABS   Oral   Take 1 packet by mouth every 4 (four) hours as  needed. Pain         . ETONOGESTREL 68 MG [place] IMPL   Subcutaneous   Inject 1 each into the skin once.           BP 115/73  Pulse 77  Temp 98 F (36.7 C) (Oral)  Resp 18  Ht 5\' 5"  (1.651 m)  Wt 130 lb (58.968 kg)  BMI 21.63 kg/m2  SpO2 100%  Physical Exam  Nursing note and vitals reviewed. Constitutional: She is oriented to person, place, and time. She appears well-developed and well-nourished. No  distress.  HENT:  Head: Normocephalic and atraumatic.  Mouth/Throat: Oropharynx is clear and moist.  Eyes: EOM are normal. Pupils are equal, round, and reactive to light.  Neck: Normal range of motion and phonation normal. Neck supple. No rigidity. No Brudzinski's sign and no Kernig's sign noted.  Cardiovascular: Normal rate, regular rhythm, normal heart sounds and intact distal pulses.   No murmur heard. Pulmonary/Chest: Effort normal and breath sounds normal.  Abdominal: Soft. She exhibits no distension and no mass. There is no tenderness. There is no rebound, no guarding and no CVA tenderness.  Musculoskeletal: Normal range of motion. She exhibits tenderness. She exhibits no edema.       Lumbar back: She exhibits tenderness and pain. She exhibits normal range of motion, no bony tenderness, no swelling, no deformity, no laceration and normal pulse.       Back:       ttp of the right lumbar paraspinal muscles.  DP pulse are brisk, distal sensation intact  Neurological: She is alert and oriented to person, place, and time. No cranial nerve deficit or sensory deficit. She exhibits normal muscle tone. Coordination and gait normal.  Reflex Scores:      Tricep reflexes are 2+ on the right side and 2+ on the left side.      Bicep reflexes are 2+ on the right side and 2+ on the left side.      Patellar reflexes are 2+ on the right side and 2+ on the left side.      Achilles reflexes are 2+ on the right side and 2+ on the left side. Skin: Skin is warm and dry.    ED Course  Procedures (including critical care time)  Labs Reviewed  URINALYSIS, ROUTINE W REFLEX MICROSCOPIC - Abnormal; Notable for the following:    Hgb urine dipstick LARGE (*)     Nitrite POSITIVE (*)     Leukocytes, UA TRACE (*)     All other components within normal limits  URINE MICROSCOPIC-ADD ON - Abnormal; Notable for the following:    Squamous Epithelial / LPF FEW (*)     Bacteria, UA MANY (*)     All other  components within normal limits  POCT PREGNANCY, URINE  URINE CULTURE        MDM    Urine culture is pending.  Previous ED charts reviewed.     No fever, chills or vomiting.  Pt is otherwise well appearing.  Has hx of pyleonephritis.  Will treat with Keflex and pt agrees to return here if her sx's worsen.     Rx: Keflex  Flexeril ultram  Steffon Gladu L. [place], [place] 03/29/12 2024

## 2012-03-29 LAB — URINE CULTURE

## 2012-03-30 NOTE — ED Provider Notes (Signed)
Medical screening examination/treatment/procedure(s) were performed by non-physician practitioner and as supervising physician I was immediately available for consultation/collaboration. Vonna Brabson, MD, FACEP   Rossetta Kama L Latonya Knight, MD 03/30/12 1506 

## 2012-03-30 NOTE — ED Notes (Signed)
+   Urine Patient treated with Kelfex-sensitive to same-chart appended per protocol MD. 

## 2012-10-31 ENCOUNTER — Emergency Department (HOSPITAL_COMMUNITY): Payer: Medicaid Other

## 2012-10-31 ENCOUNTER — Emergency Department (HOSPITAL_COMMUNITY)
Admission: EM | Admit: 2012-10-31 | Discharge: 2012-10-31 | Disposition: A | Discharge status: Home / Self Care | Payer: Medicaid Other | Attending: Emergency Medicine | Admitting: Emergency Medicine

## 2012-10-31 ENCOUNTER — Encounter (HOSPITAL_COMMUNITY): Payer: Self-pay | Admitting: *Deleted

## 2012-10-31 DIAGNOSIS — N12 Tubulo-interstitial nephritis, not specified as acute or chronic: Secondary | ICD-10-CM | POA: Insufficient documentation

## 2012-10-31 DIAGNOSIS — F17200 Nicotine dependence, unspecified, uncomplicated: Secondary | ICD-10-CM | POA: Insufficient documentation

## 2012-10-31 DIAGNOSIS — G43909 Migraine, unspecified, not intractable, without status migrainosus: Secondary | ICD-10-CM | POA: Insufficient documentation

## 2012-10-31 DIAGNOSIS — G8929 Other chronic pain: Secondary | ICD-10-CM | POA: Insufficient documentation

## 2012-10-31 DIAGNOSIS — Z3202 Encounter for pregnancy test, result negative: Secondary | ICD-10-CM | POA: Insufficient documentation

## 2012-10-31 DIAGNOSIS — R300 Dysuria: Secondary | ICD-10-CM | POA: Insufficient documentation

## 2012-10-31 DIAGNOSIS — R42 Dizziness and giddiness: Secondary | ICD-10-CM | POA: Insufficient documentation

## 2012-10-31 DIAGNOSIS — Z8739 Personal history of other diseases of the musculoskeletal system and connective tissue: Secondary | ICD-10-CM | POA: Insufficient documentation

## 2012-10-31 DIAGNOSIS — Z79899 Other long term (current) drug therapy: Secondary | ICD-10-CM | POA: Insufficient documentation

## 2012-10-31 DIAGNOSIS — R509 Fever, unspecified: Secondary | ICD-10-CM | POA: Insufficient documentation

## 2012-10-31 DIAGNOSIS — R51 Headache: Secondary | ICD-10-CM | POA: Insufficient documentation

## 2012-10-31 DIAGNOSIS — M25569 Pain in unspecified knee: Secondary | ICD-10-CM | POA: Insufficient documentation

## 2012-10-31 DIAGNOSIS — M25579 Pain in unspecified ankle and joints of unspecified foot: Secondary | ICD-10-CM | POA: Insufficient documentation

## 2012-10-31 DIAGNOSIS — M545 Low back pain, unspecified: Secondary | ICD-10-CM | POA: Insufficient documentation

## 2012-10-31 DIAGNOSIS — Z8659 Personal history of other mental and behavioral disorders: Secondary | ICD-10-CM | POA: Insufficient documentation

## 2012-10-31 LAB — URINALYSIS, ROUTINE W REFLEX MICROSCOPIC
Bilirubin Urine: NEGATIVE
Glucose, UA: NEGATIVE mg/dL
Specific Gravity, Urine: 1.015 (ref 1.005–1.030)

## 2012-10-31 LAB — CBC
HCT: 42 % (ref 36.0–46.0)
Hemoglobin: 14.6 g/dL (ref 12.0–15.0)
MCH: 30 pg (ref 26.0–34.0)
RBC: 4.86 MIL/uL (ref 3.87–5.11)

## 2012-10-31 LAB — LACTIC ACID, PLASMA: Lactic Acid, Venous: 1.5 mmol/L (ref 0.5–2.2)

## 2012-10-31 LAB — URINE MICROSCOPIC-ADD ON

## 2012-10-31 LAB — BASIC METABOLIC PANEL
BUN: 24 mg/dL — ABNORMAL HIGH (ref 6–23)
CO2: 26 mEq/L (ref 19–32)
Glucose, Bld: 94 mg/dL (ref 70–99)
Potassium: 4 mEq/L (ref 3.5–5.1)
Sodium: 136 mEq/L (ref 135–145)

## 2012-10-31 LAB — PREGNANCY, URINE: Preg Test, Ur: NEGATIVE

## 2012-10-31 MED ORDER — CEFTRIAXONE SODIUM 1 G IJ SOLR
1.0000 g | Freq: Once | INTRAMUSCULAR | Status: AC
  Administered 2012-10-31: 1 g via INTRAVENOUS
  Filled 2012-10-31: qty 10

## 2012-10-31 MED ORDER — KETOROLAC TROMETHAMINE 30 MG/ML IJ SOLN
30.0000 mg | Freq: Once | INTRAMUSCULAR | Status: AC
  Administered 2012-10-31: 30 mg via INTRAVENOUS

## 2012-10-31 MED ORDER — SODIUM CHLORIDE 0.9 % IV BOLUS (SEPSIS)
1000.0000 mL | Freq: Once | INTRAVENOUS | Status: AC
  Administered 2012-10-31: 1000 mL via INTRAVENOUS

## 2012-10-31 MED ORDER — METOCLOPRAMIDE HCL 10 MG PO TABS: 10.0000 mg | ORAL_TABLET | Freq: Four times a day (QID) | ORAL | Status: DC

## 2012-10-31 MED ORDER — METOCLOPRAMIDE HCL 5 MG/ML IJ SOLN
10.0000 mg | Freq: Once | INTRAMUSCULAR | Status: AC
  Administered 2012-10-31: 10 mg via INTRAVENOUS
  Filled 2012-10-31: qty 2

## 2012-10-31 MED ORDER — KETOROLAC TROMETHAMINE 30 MG/ML IJ SOLN
INTRAMUSCULAR | Status: AC
  Administered 2012-10-31: 30 mg via INTRAVENOUS
  Filled 2012-10-31: qty 1

## 2012-10-31 MED ORDER — MORPHINE SULFATE 4 MG/ML IJ SOLN
4.0000 mg | Freq: Once | INTRAMUSCULAR | Status: AC
  Administered 2012-10-31: 4 mg via INTRAVENOUS
  Filled 2012-10-31: qty 1

## 2012-10-31 MED ORDER — CEPHALEXIN 500 MG PO CAPS: 500.0000 mg | ORAL_CAPSULE | Freq: Once | ORAL | Status: DC

## 2012-10-31 MED ORDER — CEPHALEXIN 500 MG PO CAPS: 500.0000 mg | ORAL_CAPSULE | Freq: Three times a day (TID) | ORAL | Status: DC

## 2012-10-31 MED ORDER — HYDROCODONE-ACETAMINOPHEN 5-325 MG PO TABS
1.0000 | ORAL_TABLET | Freq: Once | ORAL | Status: DC
  Filled 2012-10-31: qty 1

## 2012-10-31 NOTE — ED Notes (Signed)
Pt alert & oriented x4, stable gait. Patient given discharge instructions, paperwork & prescription(s). Patient  instructed to stop at the registration desk to finish any additional paperwork. Patient verbalized understanding. Pt left department w/ no further questions. 

## 2012-10-31 NOTE — ED Notes (Signed)
Vomiting since last Monday intermittently with eating or drinking. "trouble breathing for atleast a month but it is getting worse, continuous back pains, dizzy, and a migraine since yesterday morning." No active vomiting on arrival. Ambulating with steady gait. NAD at this time.

## 2012-10-31 NOTE — ED Provider Notes (Signed)
History    CSN: [id number] Arrival date & time 10/31/12  1452  First MD Initiated Contact with Patient 10/31/12 1727     Chief Complaint  Patient presents with  . Emesis   (Consider location/radiation/quality/duration/timing/severity/associated sxs/prior Treatment) HPI  Complains of vomiting multiple times within the past week. Accompanied by headache, typical of her migraines that she suffered in the past. She also complains of mild dysuria i.e. burning at urethral meatus she denies abdominal pain denies, admits to bilateral low back pain which is chronic for the past 10 years. Other associated symptoms include lightheadedness., Subjective fever  last night. She denies nausea at present. She ate potato chips while waiting to be seen. Also admits to bilateral knee pain and bilateral ankle pain for several years. Unchanged. Nothing makes symptoms better or worse. No other associated symptoms. She is treated herself with Tylenol advil without  Past Medical History  Diagnosis Date  . Scoliosis   . Headache(784.0)   . Anxiety   . Depression    Past Surgical History  Procedure Laterality Date  . Mouth surgery     Family History  Problem Relation Age of Onset  . Anesthesia problems Neg Hx   . Hypotension Neg Hx   . Malignant hyperthermia Neg Hx   . Pseudochol deficiency Neg Hx    History  Substance Use Topics  . Smoking status: Current Every Day Smoker -- 0.25 packs/day for 10 years    Types: Cigarettes  . Smokeless tobacco: Never Used  . Alcohol Use: No   OB History   Grav Para Term Preterm Abortions TAB SAB Ect Mult Living   2 2 2  0 0 0 0 0 0 2     Review of Systems  Constitutional: Positive for fever.  Respiratory: Negative.   Cardiovascular: Negative.   Gastrointestinal: Negative.   Genitourinary: Positive for dysuria.  Musculoskeletal: Positive for back pain and arthralgias.       Bilateral ankle pain knee pain and low back pain for several years, chronic and  unchanged  Skin: Negative.   Neurological: Positive for light-headedness and headaches.  Psychiatric/Behavioral: Negative.   All other systems reviewed and are negative.    Allergies  Hydrocodone  Home Medications   Current Outpatient Rx  Name  Route  Sig  Dispense  Refill  . Aspirin-Salicylamide-Caffeine (BC HEADACHE) 325-95-16 MG TABS   Oral   Take 1 packet by mouth every 4 (four) hours as needed. Pain         . cephALEXin (KEFLEX) 500 MG capsule   Oral   Take 1 capsule (500 mg total) by mouth 4 (four) times daily. For 7 days   28 capsule   0   . cyclobenzaprine (FLEXERIL) 10 MG tablet   Oral   Take 1 tablet (10 mg total) by mouth 3 (three) times daily as needed for muscle spasms.   21 tablet   0   . etonogestrel (IMPLANON) 68 MG IMPL implant   Subcutaneous   Inject 1 each into the skin once.         Marland Kitchen ibuprofen (ADVIL,MOTRIN) 800 MG tablet   Oral   Take 1 tablet (800 mg total) by mouth 3 (three) times daily.   21 tablet   0    BP 105/75  Pulse 89  Temp(Src) 98.4 F (36.9 C) (Oral)  Resp 16  Ht 5\' 3"  (1.6 m)  Wt 130 lb (78.295 kg)  BMI 23.03 kg/m2  SpO2 100%  LMP 10/17/2012  Physical Exam  Nursing note and vitals reviewed. Constitutional: She is oriented to person, place, and time. She appears well-developed and well-nourished. No distress.  Not ill-appearing  HENT:  Head: Normocephalic and atraumatic.  Eyes: Conjunctivae are normal. Pupils are equal, round, and reactive to light.  Neck: Neck supple. No tracheal deviation present. No thyromegaly present.  Cardiovascular: Normal rate and regular rhythm.   No murmur heard. Pulmonary/Chest: Effort normal and breath sounds normal.  Abdominal: Soft. Bowel sounds are normal. She exhibits no distension. There is no tenderness.  Musculoskeletal: Normal range of motion. She exhibits no edema and no tenderness.  Neurological: She is alert and oriented to person, place, and time. No cranial nerve deficit.  Coordination normal.  Skin: Skin is warm and dry. No rash noted.  Psychiatric: She has a normal mood and affect.    ED Course  Procedures (including critical care time) Labs Reviewed  CBC - Abnormal; Notable for the following:    WBC 29.6 (*)    All other components within normal limits  BASIC METABOLIC PANEL - Abnormal; Notable for the following:    BUN 24 (*)    Creatinine, Ser 1.53 (*)    GFR calc non Af Amer 46 (*)    GFR calc Af Amer 53 (*)    All other components within normal limits  URINALYSIS, ROUTINE W REFLEX MICROSCOPIC - Abnormal; Notable for the following:    APPearance HAZY (*)    Hgb urine dipstick SMALL (*)    Protein, ur TRACE (*)    All other components within normal limits  URINE MICROSCOPIC-ADD ON - Abnormal; Notable for the following:    Squamous Epithelial / LPF FEW (*)    Bacteria, UA FEW (*)    Casts WBC CAST (*)    All other components within normal limits  URINE CULTURE  PREGNANCY, URINE   Results for orders placed during the hospital encounter of 10/31/12  CBC      Result Value Range   WBC 29.6 (*) 4.0 - 10.5 K/uL   RBC 4.86  3.87 - 5.11 MIL/uL   Hemoglobin 14.6  12.0 - 15.0 g/dL   HCT 16.1  09.6 - 04.5 %   MCV 86.4  78.0 - 100.0 fL   MCH 30.0  26.0 - 34.0 pg   MCHC 34.8  30.0 - 36.0 g/dL   RDW 40.9  81.1 - 91.4 %   Platelets 166  150 - 400 K/uL  BASIC METABOLIC PANEL      Result Value Range   Sodium 136  135 - 145 mEq/L   Potassium 4.0  3.5 - 5.1 mEq/L   Chloride 98  96 - 112 mEq/L   CO2 26  19 - 32 mEq/L   Glucose, Bld 94  70 - 99 mg/dL   BUN 24 (*) 6 - 23 mg/dL   Creatinine, Ser 7.82 (*) 0.50 - 1.10 mg/dL   Calcium 8.9  8.4 - 95.6 mg/dL   GFR calc non Af Amer 46 (*) >90 mL/min   GFR calc Af Amer 53 (*) >90 mL/min  URINALYSIS, ROUTINE W REFLEX MICROSCOPIC      Result Value Range   Color, Urine YELLOW  YELLOW   APPearance HAZY (*) CLEAR   Specific Gravity, Urine 1.015  1.005 - 1.030   pH 6.0  5.0 - 8.0   Glucose, UA NEGATIVE   NEGATIVE mg/dL   Hgb urine dipstick SMALL (*) NEGATIVE   Bilirubin Urine NEGATIVE  NEGATIVE  Ketones, ur NEGATIVE  NEGATIVE mg/dL   Protein, ur TRACE (*) NEGATIVE mg/dL   Urobilinogen, UA 0.2  0.0 - 1.0 mg/dL   Nitrite NEGATIVE  NEGATIVE   Leukocytes, UA NEGATIVE  NEGATIVE  PREGNANCY, URINE      Result Value Range   Preg Test, Ur NEGATIVE  NEGATIVE  URINE MICROSCOPIC-ADD ON      Result Value Range   Squamous Epithelial / LPF FEW (*) RARE   WBC, UA 21-50  <3 WBC/hpf   RBC / HPF 0-2  <3 RBC/hpf   Bacteria, UA FEW (*) RARE   Casts WBC CAST (*) NEGATIVE  LACTIC ACID, PLASMA      Result Value Range   Lactic Acid, Venous 1.5  0.5 - 2.2 mmol/L   Dg Chest 2 View  10/31/2012   *RADIOLOGY REPORT*  Clinical Data: Shortness of breath.  CHEST - 2 VIEW  Comparison: None.  Findings: Minimal peribronchial thickening right hilum possibly normal or reactive in origin.  No infiltrate, congestive heart failure or pneumothorax.  Nodular density left lung may represent a nipple shadow however, nipple marker view would be necessary to confirm such and exclude underlying nodule.  IMPRESSION: Minimal peribronchial thickening right hilum possibly normal or reactive in origin.  No infiltrate, congestive heart failure or pneumothorax.  Nodular density left lung may represent a nipple shadow however, nipple marker view would be necessary to confirm such and exclude underlying nodule   Original Report Authenticated By: Lacy Duverney, M.D.    No results found. No diagnosis found. 8:05 PM patient feels much improved after treatment with intravenous fluids, Reglan, Zofran, morphine. She is alert and ambulatory and not lightheaded on standing and appears comfortable MDM  I did not feel patient be septic and noted to not feel that leukocytosis correlates clinically. Plan prescription for Keflex ,reglan Dx #1 pyelonephritis #2 renal insufficiency #3 migraine headache  Doug Sou, MD 10/31/12 2115

## 2012-11-01 ENCOUNTER — Emergency Department (HOSPITAL_COMMUNITY)
Admission: EM | Admit: 2012-11-01 | Discharge: 2012-11-01 | Disposition: A | Discharge status: Home / Self Care | Payer: Medicaid Other | Attending: Emergency Medicine | Admitting: Emergency Medicine

## 2012-11-01 ENCOUNTER — Encounter (HOSPITAL_COMMUNITY): Payer: Self-pay | Admitting: Emergency Medicine

## 2012-11-01 DIAGNOSIS — Z8659 Personal history of other mental and behavioral disorders: Secondary | ICD-10-CM | POA: Insufficient documentation

## 2012-11-01 DIAGNOSIS — Z8739 Personal history of other diseases of the musculoskeletal system and connective tissue: Secondary | ICD-10-CM | POA: Insufficient documentation

## 2012-11-01 DIAGNOSIS — Z79899 Other long term (current) drug therapy: Secondary | ICD-10-CM | POA: Insufficient documentation

## 2012-11-01 DIAGNOSIS — F17200 Nicotine dependence, unspecified, uncomplicated: Secondary | ICD-10-CM | POA: Insufficient documentation

## 2012-11-01 DIAGNOSIS — G8929 Other chronic pain: Secondary | ICD-10-CM | POA: Insufficient documentation

## 2012-11-01 DIAGNOSIS — G43909 Migraine, unspecified, not intractable, without status migrainosus: Secondary | ICD-10-CM | POA: Insufficient documentation

## 2012-11-01 HISTORY — DX: Headache: R51

## 2012-11-01 HISTORY — DX: Dorsalgia, unspecified: M54.9

## 2012-11-01 HISTORY — DX: Headache, unspecified: R51.9

## 2012-11-01 HISTORY — DX: Pain in unspecified knee: M25.569

## 2012-11-01 HISTORY — DX: Pain in unspecified ankle and joints of unspecified foot: M25.579

## 2012-11-01 HISTORY — DX: Other chronic pain: G89.29

## 2012-11-01 MED ORDER — KETOROLAC TROMETHAMINE 60 MG/2ML IM SOLN
60.0000 mg | Freq: Once | INTRAMUSCULAR | Status: AC
  Administered 2012-11-01: 60 mg via INTRAMUSCULAR
  Filled 2012-11-01: qty 2

## 2012-11-01 MED ORDER — DIPHENHYDRAMINE HCL 50 MG/ML IJ SOLN
50.0000 mg | Freq: Once | INTRAMUSCULAR | Status: AC
  Administered 2012-11-01: 50 mg via INTRAMUSCULAR
  Filled 2012-11-01: qty 1

## 2012-11-01 MED ORDER — METOCLOPRAMIDE HCL 5 MG/ML IJ SOLN
10.0000 mg | Freq: Once | INTRAMUSCULAR | Status: AC
  Administered 2012-11-01: 10 mg via INTRAMUSCULAR
  Filled 2012-11-01: qty 2

## 2012-11-01 NOTE — ED Provider Notes (Signed)
History    CSN: [id number] Arrival date & time 11/01/12  1304  First MD Initiated Contact with Patient 11/01/12 1328     Chief Complaint  Patient presents with  . Migraine    HPI Pt was seen at 1340.  Per pt, c/o gradual onset and persistence of constant acute flair of her chronic migraine headache for the past 3 days.  Describes the headache as per her usual chronic migraine headache pain pattern x10 years.  States she has been taking OTC BC powders without relief.  Denies headache was sudden or maximal in onset or at any time.  Denies visual changes, no focal motor weakness, no tingling/numbness in extremities, no fevers, no neck pain, no rash.      Past Medical History  Diagnosis Date  . Scoliosis   . Headache(784.0)   . Anxiety   . Depression   . Chronic back pain   . Chronic headaches   . Chronic ankle pain   . Chronic knee pain    Past Surgical History  Procedure Laterality Date  . Mouth surgery     Family History  Problem Relation Age of Onset  . Anesthesia problems Neg Hx   . Hypotension Neg Hx   . Malignant hyperthermia Neg Hx   . Pseudochol deficiency Neg Hx    History  Substance Use Topics  . Smoking status: Current Every Day Smoker -- 0.25 packs/day for 10 years    Types: Cigarettes  . Smokeless tobacco: Never Used  . Alcohol Use: No   OB History   Grav Para Term Preterm Abortions TAB SAB Ect Mult Living   2 2 2  0 0 0 0 0 0 2     Review of Systems ROS: Statement: All systems negative except as marked or noted in the HPI; Constitutional: Negative for fever and chills. ; ; Eyes: Negative for eye pain, redness and discharge. ; ; ENMT: Negative for ear pain, hoarseness, nasal congestion, sinus pressure and sore throat. ; ; Cardiovascular: Negative for chest pain, palpitations, diaphoresis, dyspnea and peripheral edema. ; ; Respiratory: Negative for cough, wheezing and stridor. ; ; Gastrointestinal: Negative for nausea, vomiting, diarrhea, abdominal pain,  blood in stool, hematemesis, jaundice and rectal bleeding. . ; ; Genitourinary: Negative for dysuria, flank pain and hematuria. ; ; Musculoskeletal: Negative for back pain and neck pain. Negative for swelling and trauma.; ; Skin: Negative for pruritus, rash, abrasions, blisters, bruising and skin lesion.; ; Neuro: +headache. Negative for lightheadedness and neck stiffness. Negative for weakness, altered level of consciousness , altered mental status, extremity weakness, paresthesias, involuntary movement, seizure and syncope.       Allergies  Hydrocodone  Home Medications   Current Outpatient Rx  Name  Route  Sig  Dispense  Refill  . Aspirin-Salicylamide-Caffeine (BC HEADACHE) 325-95-16 MG TABS   Oral   Take 1 packet by mouth daily as needed. Pain         . cephALEXin (KEFLEX) 500 MG capsule   Oral   Take 1 capsule (500 mg total) by mouth 3 (three) times daily.   30 capsule   0   . etonogestrel (IMPLANON) 68 MG IMPL implant   Subcutaneous   Inject 1 each into the skin once.         . metoCLOPramide (REGLAN) 10 MG tablet   Oral   Take 1 tablet (10 mg total) by mouth every 6 (six) hours.   10 tablet   0    BP  138/86  Pulse 91  Temp(Src) 98.4 F (36.9 C) (Oral)  Resp 18  Ht 5\' 4"  (1.626 m)  Wt 130 lb (58.968 kg)  BMI 22.3 kg/m2  SpO2 100%  LMP 10/17/2012  Physical Exam 1345: Physical examination:  Nursing notes reviewed; Vital signs and O2 SAT reviewed;  Constitutional: Well developed, Well nourished, Well hydrated, In no acute distress; Head:  Normocephalic, atraumatic; Eyes: EOMI, PERRL, No scleral icterus; ENMT: TM's clear bilat. Mouth and pharynx normal, Mucous membranes moist; Neck: Supple. No meningeal signs. Full range of motion, No lymphadenopathy; Cardiovascular: Regular rate and rhythm, No murmur, rub, or gallop; Respiratory: Breath sounds clear & equal bilaterally, No rales, rhonchi, wheezes.  Speaking full sentences with ease, Normal respiratory  effort/excursion; Chest: Nontender, Movement normal; Abdomen: Soft, Nontender, Nondistended, Normal bowel sounds; Genitourinary: No CVA tenderness; Spine:  No midline CS, TS, LS tenderness.;; Extremities: Pulses normal, No tenderness, No edema, No calf edema or asymmetry.; Neuro: AA&Ox3, Major CN grossly intact.  Speech clear. No facial droop. No gross focal motor or sensory deficits in extremities.; Skin: Color normal, Warm, Dry.   ED Course  Procedures   1350:  Pt seen in the ED yesterday for same, states she continues to have her usual migraine headache.  States she took one reglan by itself without relief since yesterday.  Denies flank/back pain today. Denies fevers. Physical exam unremarkable. No red flags.  Will tx for migraine headache.   1620:  Pt has tol PO well while in the ED without N/V. VSS, neuro exam unchanged. Feels better after meds and wants to go home now. Dx d/w pt.  Questions answered.  Verb understanding, agreeable to d/c home with outpt f/u.   MDM  MDM Reviewed: previous chart, vitals and nursing note      Laray Anger, DO 11/03/12 1545

## 2012-11-01 NOTE — Discharge Instructions (Signed)
RESOURCE GUIDE  Chronic Pain Problems: Contact [facility]  [phone] Patients need to be referred by their primary care doctor.  Insufficient Money for Medicine: Contact United Way:  call "211."   No Primary Care Doctor: - Call Health Connect  [phone] - can help you locate a primary care doctor that  accepts your insurance, provides certain services, etc. - Physician Referral Service- [phone]  Agencies that provide inexpensive medical care: - [facility]  [phone] - [facility]  [phone] - [facility]  [phone] - Women's Clinic  [phone] - [facility]  [phone] [facility]  [phone]  Medicaid-accepting [facility] Providers: - [facility]- [address]  [phone], Mon-Fri 9am-7pm, Sat 9am-1pm - [facility]- [address] [place], [address] [place]  [phone] - [facility]- [address], [address] [place]  [phone] [facility]- [address]  [phone] - Renaye Rakers- [address] [place], [address], [phone]  Only accepts [place] patients after they have their name  applied to their card  Self Pay (no insurance) in [place]: - Sickle Cell Patients - [facility]  [address] [place], [phone] - [facility]- [address] [place]  [phone]       [facility] [place]- [address]       -     [facility]- see information above (Speak to [employer] if you do not have insurance)       -  [facility]- [address],  [phone]       -  [facility]- [address], [phone]       -  Dr Julio Sicks-  [address], [place], [phone]       -  [facility] and [facility] - [address], [phone]       -  [facility]- [address], [phone], also [address] [phone]       -     [facility]- [address] [place], [phone], 1st & 3rd Saturday         every month, 10am-1pm  -     [facility]   [address] [place], [place].   Phone:  [phone], Fax:  [phone]. Hours of Operation:  9 am - 6 pm, M-F.  -     [facility]   [address], [place]   Phone: [phone], Fax: [phone]. Hours of Operation:  8:30 am - 5:30 pm, M-F.  [facility] [address] [place], [place] [address] [phone]  [facility] [address] [place], [place] [address] [phone]  1) Find a Doctor and Pay Out of Pocket Although you won't have to find out who is covered by your insurance plan, it is a good idea to ask around and get recommendations. You will then need to call the office and see if the doctor you have chosen will accept you as a new patient and what types of options they offer for patients who are self-pay. Some doctors offer discounts or will set up payment plans for their patients who do not have insurance, but  you will need to ask so you aren't surprised when you get to your appointment.  2) Contact Your Local Health Department Not all health departments have doctors that can see patients for sick visits, but many do, so it is worth a call to see if yours does. If you don't know where your local health department is, you can check in your phone book. The CDC also has a tool to help you locate your state's health department, and many state websites also have listings of all of their local health departments.  3) Find a Walk-in Clinic If your illness is not likely to be very severe or complicated, you may want to try a walk in clinic. These are popping up all over the country in pharmacies, drugstores, and shopping centers. They're usually staffed by nurse practitioners or physician assistants that have been trained to  treat common illnesses and complaints. They're usually fairly quick and inexpensive. However, if you have serious medical issues or chronic medical problems, these are probably not your best option  STD Testing - [facility] Department of [facility], STD Clinic, [address], [place], phone [phone] or [phone].  Monday - Friday, call for an appointment. [facility] Department of [employer], STD Clinic, [address], [place], phone [phone] or [phone].  Monday - Friday, call for an appointment.  Abuse/Neglect: [facility] Child Abuse Hotline 920-723-7885 [facility] Child Abuse Hotline (402)288-6598 (After Hours)  Emergency Shelter:  [facility] [phone]  Maternity Homes: - [facility] [place] [facility] [phone] - [facility] [phone]  MRSA Hotline #:   913-752-7707  Dental Assistance If unable to pay or uninsured, contact:  [facility]. to become qualified for the adult dental clinic.  Patients with Medicaid: [facility] [phone] [address], [phone] [address], [phone]  If unable to pay, or uninsured, contact [facility] [phone] in [place], [phone] in [facility]) to become qualified for the adult dental clinic  [facility] [address] [place], [place] [address] [phone] [email or web address]  Other Proofreader Services: - [facility]- [address] [place], [place], [place], [address], [phone], Ext. [phone], 2nd and 4th Thursday of the month at 6:30am.  10 clients each day by appointment, can sometimes see walk-in patients if someone does not show for an appointment. [facility]- [address] [place], [place], [address], [phone] - [facility] [address] [place], [place], [address], [phone] - Lane  Health Department- [phone] [facility] Health Department- [phone] [facility] Health [phone]       Behavioral Health Resources in the [facility]  Intensive Outpatient Programs: [facility]      [address] [place], [place] [phone] Both a day and evening program       [facility] Outpatient     [address]        [place], [place] [address] [phone]         ADS: [facility] [address] [place] [phone]  [facility] ACCESS LINE: 514-319-9365 or [phone] [address] [place], [place] [address] [email or web address]   Substance Abuse Resources: - [facility]  [phone] - [facility] [phone] - [facility] [phone] [facility] [phone] -  Residential & Outpatient Substance Abuse Program  800-659-3381 ° °Psychological Services: °- [facility]  [phone] °- [facility]  [phone] °- [facility], [address], [place], ACCESS LINE: 1-800-853-5163 or 336-641-4981, [email or web address].htm ° °Mobile Crisis Teams:         °                               °[facility]         °Mobile Crisis Care Unit °1-877-626-1772       °      °Assertive °Psychotherapeutic Services °[address] [place] °336-834-9664 °                                        °Interventionist °Sharon DeEsch °[address] °[place] °336-554-5454 ° °Self-Help/Support Groups: °[facility] [place] Variety of support groups °373-1402 (call for more info) ° °Narcotics Anonymous (NA) °[facility] °[address] °[place] - 2 meetings at this location ° °Residential Treatment Programs:  °[facility]      °[address]        °[place]       °[phone]        ° °[facility] °[address] °[place], [place]  [address] °[phone] ° °[facility]  °[address] °[place], [place] [address] °336-845-3988 °Admissions: 8am-3pm M-F ° °[facility]     °[address]        °[place], [place] [address]       °336-841-1104        ° °[facility] °[address] °[place], [place] °[phone] ° °[facility] °[address] °[place], [place] °336-285-9073 ° °[facility] - Intensive Outpatient      °[address]     °[place], [place]       °852-3033        ° °[facility]     °[address] °[place], [place] °877-615-2722 or 336-784-9470 ° °[facility] ([facility]), Medicaid °[address] °[place], [place] °336-227-7417 ° °[facility]                                               °[address] °[place] °800-659-3381 ° °[place] Resources: °[facility]- 1-888-581-9988              ° °General Therapy                                                °Julie Brannon, PhD        °[address]                                       °[place], [place] [address]         °336-349-5553   °Insurance ° °[place]   Behavioral   [address] [place], [place] [address] [phone]  [facility] [address] [place], [place] [address] [phone] Insurance/Medicaid/sponsorship through [facility] Families                                              [address]                                        [place], [place] [address]    Therapy/tele-psych/case         [phone]          [facility] [address], [place]  [address]  Adolescent/group home/case management [phone]                                           Creola Corn PhD       General therapy       Insurance   [id number]         Dr. Lolly Mustache, [place], M-F [phone]  [facility]  [employer] [facility] Dept. [address]                 [address]         [address] [place] [address]  [place] Reveal Phone:  [phone]                                  Phone:  [phone]                   Phone:  [phone]  [facility], [phone] - [facility]- [phone]       -     [facility] in [place], [address]             [phone], Insurance  [place] Child Abuse Hotline 616-742-6719 or [phone]) [phone] (After Hours)   Take over the counter tylenol, ibuprofen and benadryl, as directed on packaging, with the prescription given to you yesterday (reglan), as needed for headache.  Keep a headache diary, as discussed.  Call your regular medical doctor tomorrow to schedule a follow up appointment within the next 3  days.  Return to the Emergency Department immediately sooner if worsening.

## 2012-11-01 NOTE — ED Notes (Signed)
Patient c/o migraine x3 days. Per patient was seen here yesterday for it and the "pain eased off but has come back worse then it was." Denies any nausea or vomiting at this time.

## 2012-11-02 LAB — URINE CULTURE

## 2012-11-03 NOTE — ED Notes (Signed)
Post ED Visit - Positive Culture Follow-up  Culture report reviewed by antimicrobial stewardship pharmacist: []  Wes Dulaney, Pharm.D., BCPS [x]  Celedonio Miyamoto, [address].D., BCPS []  Georgina Pillion, [address].D., BCPS []  Alexis, [address].D., BCPS, AAHIVP []  Estella Husk, Pharm.D., BCPS, AAHIVP  Positive Urine culture Treated with Cephalexin, organism sensitive to the same and no further patient follow-up is required at this time.  Larena Sox 11/03/2012, 2:58 PM

## 2012-11-08 ENCOUNTER — Emergency Department (HOSPITAL_COMMUNITY)
Admission: EM | Admit: 2012-11-08 | Discharge: 2012-11-08 | Disposition: A | Discharge status: Home / Self Care | Payer: Medicaid Other | Attending: Emergency Medicine | Admitting: Emergency Medicine

## 2012-11-08 ENCOUNTER — Encounter (HOSPITAL_COMMUNITY): Payer: Self-pay | Admitting: *Deleted

## 2012-11-08 DIAGNOSIS — M25569 Pain in unspecified knee: Secondary | ICD-10-CM | POA: Insufficient documentation

## 2012-11-08 DIAGNOSIS — F411 Generalized anxiety disorder: Secondary | ICD-10-CM | POA: Insufficient documentation

## 2012-11-08 DIAGNOSIS — G8929 Other chronic pain: Secondary | ICD-10-CM | POA: Insufficient documentation

## 2012-11-08 DIAGNOSIS — F17200 Nicotine dependence, unspecified, uncomplicated: Secondary | ICD-10-CM | POA: Insufficient documentation

## 2012-11-08 DIAGNOSIS — R51 Headache: Secondary | ICD-10-CM | POA: Insufficient documentation

## 2012-11-08 DIAGNOSIS — Z8659 Personal history of other mental and behavioral disorders: Secondary | ICD-10-CM | POA: Insufficient documentation

## 2012-11-08 DIAGNOSIS — M545 Low back pain, unspecified: Secondary | ICD-10-CM | POA: Insufficient documentation

## 2012-11-08 DIAGNOSIS — Z79899 Other long term (current) drug therapy: Secondary | ICD-10-CM | POA: Insufficient documentation

## 2012-11-08 DIAGNOSIS — Z87448 Personal history of other diseases of urinary system: Secondary | ICD-10-CM | POA: Insufficient documentation

## 2012-11-08 DIAGNOSIS — Z8739 Personal history of other diseases of the musculoskeletal system and connective tissue: Secondary | ICD-10-CM | POA: Insufficient documentation

## 2012-11-08 MED ORDER — BACLOFEN 10 MG PO TABS: 10.0000 mg | ORAL_TABLET | Freq: Three times a day (TID) | ORAL | Status: AC

## 2012-11-08 MED ORDER — DEXAMETHASONE 6 MG PO TABS: ORAL_TABLET | ORAL | Status: DC

## 2012-11-08 MED ORDER — KETOROLAC TROMETHAMINE 10 MG PO TABS
10.0000 mg | ORAL_TABLET | Freq: Once | ORAL | Status: AC
  Administered 2012-11-08: 10 mg via ORAL
  Filled 2012-11-08: qty 1

## 2012-11-08 MED ORDER — ONDANSETRON HCL 4 MG PO TABS
4.0000 mg | ORAL_TABLET | Freq: Once | ORAL | Status: AC
  Administered 2012-11-08: 4 mg via ORAL
  Filled 2012-11-08: qty 1

## 2012-11-08 MED ORDER — DIAZEPAM 5 MG PO TABS
5.0000 mg | ORAL_TABLET | Freq: Once | ORAL | Status: AC
  Administered 2012-11-08: 5 mg via ORAL
  Filled 2012-11-08: qty 1

## 2012-11-08 MED ORDER — DICLOFENAC SODIUM 75 MG PO TBEC: 75.0000 mg | DELAYED_RELEASE_TABLET | Freq: Two times a day (BID) | ORAL | Status: DC

## 2012-11-08 MED ORDER — PREDNISONE 50 MG PO TABS
60.0000 mg | ORAL_TABLET | Freq: Once | ORAL | Status: AC
  Administered 2012-11-08: 60 mg via ORAL
  Filled 2012-11-08: qty 1

## 2012-11-08 NOTE — ED Provider Notes (Signed)
History    CSN: [id number] Arrival date & time 11/08/12  1456  First MD Initiated Contact with Patient 11/08/12 1643     Chief Complaint  Patient presents with  . Back Pain   (Consider location/radiation/quality/duration/timing/severity/associated sxs/prior Treatment) Patient is a 27 y.o. female presenting with back pain. The history is provided by the patient.  Back Pain Location:  Lumbar spine Quality:  Aching Radiates to:  Does not radiate Pain severity:  Severe Pain is:  Same all the time Onset quality:  Gradual Duration:  1 week Timing:  Intermittent Progression:  Worsening Chronicity: recent new diagnosis of pyelonephritis, hx of recurrent back pain. Associated symptoms: headaches   Associated symptoms: no abdominal pain, no chest pain and no dysuria    Past Medical History  Diagnosis Date  . Scoliosis   . Headache(784.0)   . Anxiety   . Depression   . Chronic back pain   . Chronic headaches   . Chronic ankle pain   . Chronic knee pain    Past Surgical History  Procedure Laterality Date  . Mouth surgery     Family History  Problem Relation Age of Onset  . Anesthesia problems Neg Hx   . Hypotension Neg Hx   . Malignant hyperthermia Neg Hx   . Pseudochol deficiency Neg Hx    History  Substance Use Topics  . Smoking status: Current Every Day Smoker -- 0.25 packs/day for 10 years    Types: Cigarettes  . Smokeless tobacco: Never Used  . Alcohol Use: No   OB History   Grav Para Term Preterm Abortions TAB SAB Ect Mult Living   2 2 2  0 0 0 0 0 0 2     Review of Systems  Constitutional: Negative for activity change.       All ROS Neg except as noted in HPI  HENT: Negative for nosebleeds and neck pain.   Eyes: Negative for photophobia and discharge.  Respiratory: Negative for cough, shortness of breath and wheezing.   Cardiovascular: Negative for chest pain and palpitations.  Gastrointestinal: Negative for abdominal pain and blood in stool.   Genitourinary: Negative for dysuria, frequency and hematuria.  Musculoskeletal: Positive for back pain. Negative for arthralgias.  Skin: Negative.   Neurological: Positive for headaches. Negative for dizziness, seizures and speech difficulty.  Psychiatric/Behavioral: Negative for hallucinations and confusion. The patient is nervous/anxious.     Allergies  Hydrocodone  Home Medications   Current Outpatient Rx  Name  Route  Sig  Dispense  Refill  . Aspirin-Salicylamide-Caffeine (BC HEADACHE) 325-95-16 MG TABS   Oral   Take 1 packet by mouth daily as needed. Pain         . baclofen (LIORESAL) 10 MG tablet   Oral   Take 1 tablet (10 mg total) by mouth 3 (three) times daily.   21 each   0   . cephALEXin (KEFLEX) 500 MG capsule   Oral   Take 1 capsule (500 mg total) by mouth 3 (three) times daily.   30 capsule   0   . dexamethasone (DECADRON) 6 MG tablet      1 po bid with food   12 tablet   0   . diclofenac (VOLTAREN) 75 MG EC tablet   Oral   Take 1 tablet (75 mg total) by mouth 2 (two) times daily.   12 tablet   0   . etonogestrel (IMPLANON) 68 MG IMPL implant   Subcutaneous   Inject  1 each into the skin once.         . metoCLOPramide (REGLAN) 10 MG tablet   Oral   Take 1 tablet (10 mg total) by mouth every 6 (six) hours.   10 tablet   0    BP 113/87  Pulse 95  Temp(Src) 98.8 F (37.1 C) (Oral)  Resp 18  Ht 5\' 4"  (1.626 m)  Wt 120 lb (54.432 kg)  BMI 20.59 kg/m2  SpO2 100%  LMP 11/01/2012 Physical Exam  Nursing note and vitals reviewed. Constitutional: She is oriented to person, place, and time. She appears well-developed and well-nourished.  Non-toxic appearance.  HENT:  Head: Normocephalic.  Right Ear: Tympanic membrane and external ear normal.  Left Ear: Tympanic membrane and external ear normal.  Eyes: EOM and lids are normal. Pupils are equal, round, and reactive to light.  Neck: Normal range of motion. Neck supple. Carotid bruit is not  present.  Cardiovascular: Normal rate, regular rhythm, normal heart sounds, intact distal pulses and normal pulses.   Pulmonary/Chest: Breath sounds normal. No respiratory distress.  Abdominal: Soft. Bowel sounds are normal. There is no tenderness. There is no guarding.  No significant CVA tenderness.  Musculoskeletal: Normal range of motion.  There is pain to palpation and attempted range of motion of the lumbar sacral area. There is paraspinal area tenderness and spasm noted in the same area. There no hot areas appreciated. There's no palpable step off.   Lymphadenopathy:       Head (right side): No submandibular adenopathy present.       Head (left side): No submandibular adenopathy present.    She has no cervical adenopathy.  Neurological: She is alert and oriented to person, place, and time. She has normal strength. No cranial nerve deficit or sensory deficit.  Skin: Skin is warm and dry.  Psychiatric: She has a normal mood and affect. Her speech is normal.    ED Course  Procedures (including critical care time) Labs Reviewed - No data to display No results found. 1. Back pain, lumbosacral     MDM  I have reviewed nursing notes, vital signs, and all appropriate lab and imaging results for this patient. Patient states that while being treated for a migraine headache she was found to have what was believed to be pyelonephritis. She was treated with an antibiotic on June 24, but did not get the antibiotic filled until today July 2. The patient states she is having lower back pain and was concerned that the pyelonephritis may be getting worse. The vital signs are well within normal limits with a temperature of 98.8. On examination there is minimal if any CVA tenderness. There is however of pain with palpation and range of motion of the lumbar sacral area. Patient has a history of back related issues.  Gait is steady and intact. The plan at this time is for the patient be treated with  baclofen 3 times daily, Decadron 2 times daily, and diclofenac 2 times daily with food. Patient is asked to alternate heat and ice to her lower back, she is to see her primary physician next week for recheck and followup.  Kathie Dike, PA-C 11/08/12 1725

## 2012-11-08 NOTE — ED Notes (Signed)
Back pain, Recent dx 6/24 of pyelonephritis, but did not get the antibiotics until today, dysuria no nvd.  Feels chilled at times. And weak

## 2012-11-08 NOTE — ED Provider Notes (Signed)
Medical screening examination/treatment/procedure(s) were performed by non-physician practitioner and as supervising physician I was immediately available for consultation/collaboration.    Vida Roller, MD 11/08/12 [phone]

## 2012-11-14 ENCOUNTER — Emergency Department (HOSPITAL_COMMUNITY)
Admission: EM | Admit: 2012-11-14 | Discharge: 2012-11-14 | Disposition: A | Discharge status: Home / Self Care | Payer: Medicaid Other | Attending: Emergency Medicine | Admitting: Emergency Medicine

## 2012-11-14 ENCOUNTER — Encounter (HOSPITAL_COMMUNITY): Payer: Self-pay | Admitting: *Deleted

## 2012-11-14 DIAGNOSIS — R109 Unspecified abdominal pain: Secondary | ICD-10-CM | POA: Insufficient documentation

## 2012-11-14 DIAGNOSIS — M545 Low back pain, unspecified: Secondary | ICD-10-CM | POA: Insufficient documentation

## 2012-11-14 DIAGNOSIS — F17200 Nicotine dependence, unspecified, uncomplicated: Secondary | ICD-10-CM | POA: Insufficient documentation

## 2012-11-14 DIAGNOSIS — R110 Nausea: Secondary | ICD-10-CM | POA: Insufficient documentation

## 2012-11-14 DIAGNOSIS — R197 Diarrhea, unspecified: Secondary | ICD-10-CM | POA: Insufficient documentation

## 2012-11-14 DIAGNOSIS — Z8739 Personal history of other diseases of the musculoskeletal system and connective tissue: Secondary | ICD-10-CM | POA: Insufficient documentation

## 2012-11-14 DIAGNOSIS — Z87448 Personal history of other diseases of urinary system: Secondary | ICD-10-CM | POA: Insufficient documentation

## 2012-11-14 DIAGNOSIS — G8929 Other chronic pain: Secondary | ICD-10-CM | POA: Insufficient documentation

## 2012-11-14 DIAGNOSIS — F411 Generalized anxiety disorder: Secondary | ICD-10-CM | POA: Insufficient documentation

## 2012-11-14 DIAGNOSIS — Z79899 Other long term (current) drug therapy: Secondary | ICD-10-CM | POA: Insufficient documentation

## 2012-11-14 DIAGNOSIS — F329 Major depressive disorder, single episode, unspecified: Secondary | ICD-10-CM | POA: Insufficient documentation

## 2012-11-14 DIAGNOSIS — B349 Viral infection, unspecified: Secondary | ICD-10-CM

## 2012-11-14 DIAGNOSIS — Z3202 Encounter for pregnancy test, result negative: Secondary | ICD-10-CM | POA: Insufficient documentation

## 2012-11-14 DIAGNOSIS — F3289 Other specified depressive episodes: Secondary | ICD-10-CM | POA: Insufficient documentation

## 2012-11-14 LAB — BASIC METABOLIC PANEL
BUN: 15 mg/dL (ref 6–23)
Calcium: 8.6 mg/dL (ref 8.4–10.5)
GFR calc Af Amer: >90 mL/min (ref >90)
GFR calc non Af Amer: >90 mL/min (ref >90)
Glucose, Bld: 117 mg/dL — ABNORMAL HIGH (ref 70–99)
Potassium: 2.9 mEq/L — ABNORMAL LOW (ref 3.5–5.1)
Sodium: 137 mEq/L (ref 135–145)

## 2012-11-14 LAB — URINALYSIS, ROUTINE W REFLEX MICROSCOPIC
Leukocytes, UA: NEGATIVE
Nitrite: NEGATIVE
Specific Gravity, Urine: 1.01 (ref 1.005–1.030)
Urobilinogen, UA: 0.2 mg/dL (ref 0.0–1.0)
pH: 6 (ref 5.0–8.0)

## 2012-11-14 LAB — CBC WITH DIFFERENTIAL/PLATELET
Basophils Relative: 0 % (ref 0–1)
Eosinophils Absolute: 0 10*3/uL (ref 0.0–0.7)
Eosinophils Relative: 0 % (ref 0–5)
Lymphs Abs: 0.2 10*3/uL — ABNORMAL LOW (ref 0.7–4.0)
MCH: 29.3 pg (ref 26.0–34.0)
MCHC: 33.8 g/dL (ref 30.0–36.0)
MCV: 86.9 fL (ref 78.0–100.0)
Neutrophils Relative %: 97 % — ABNORMAL HIGH (ref 43–77)
Platelets: 284 10*3/uL (ref 150–400)
RBC: 4.43 MIL/uL (ref 3.87–5.11)

## 2012-11-14 LAB — URINE MICROSCOPIC-ADD ON

## 2012-11-14 LAB — PREGNANCY, URINE: Preg Test, Ur: NEGATIVE

## 2012-11-14 MED ORDER — SODIUM CHLORIDE 0.9 % IV BOLUS (SEPSIS)
1000.0000 mL | Freq: Once | INTRAVENOUS | Status: AC
  Administered 2012-11-14: 1000 mL via INTRAVENOUS

## 2012-11-14 MED ORDER — IBUPROFEN 800 MG PO TABS: 800.0000 mg | ORAL_TABLET | Freq: Three times a day (TID) | ORAL | Status: DC | PRN

## 2012-11-14 MED ORDER — HYDROMORPHONE HCL PF 1 MG/ML IJ SOLN
1.0000 mg | Freq: Once | INTRAMUSCULAR | Status: AC
  Administered 2012-11-14: 1 mg via INTRAVENOUS
  Filled 2012-11-14: qty 1

## 2012-11-14 MED ORDER — ONDANSETRON HCL 4 MG/2ML IJ SOLN
4.0000 mg | Freq: Once | INTRAMUSCULAR | Status: AC
  Administered 2012-11-14: 4 mg via INTRAVENOUS
  Filled 2012-11-14: qty 2

## 2012-11-14 MED ORDER — PROMETHAZINE HCL 25 MG PO TABS: 25.0000 mg | ORAL_TABLET | Freq: Four times a day (QID) | ORAL | Status: DC | PRN

## 2012-11-14 MED ORDER — IBUPROFEN 800 MG PO TABS: 800.0000 mg | ORAL_TABLET | Freq: Once | ORAL | Status: DC

## 2012-11-14 NOTE — ED Notes (Signed)
Pt reports history of polynephritis. Pt having abdominal pain, back pain, diarrhea, & pain radiating down right leg.

## 2012-11-14 NOTE — ED Notes (Signed)
Pt arrives EMS, restless, moaning in pain, chills, c/o of back pain, radiating into both legs bilaterally. Denies problems with urination. States she has only be 3 times in 2 days, pt states urine was clear yellow.  Pt in gown, warm blanket, IV started by EMS

## 2012-11-14 NOTE — ED Notes (Signed)
Pt alert & oriented x4, stable gait. Patient given discharge instructions, paperwork & prescription(s). Patient  instructed to stop at the registration desk to finish any additional paperwork. Patient verbalized understanding. Pt left department w/ no further questions. 

## 2012-11-14 NOTE — ED Provider Notes (Signed)
History  This chart was scribed for Brianna Lennert, MD, by Brianna Hughes, ED Scribe. This patient was seen in room APA02/APA02 and the patient's care was started at 8:24 PM  CSN: [id number] Arrival date & time 11/14/12  Brianna Hughes  First MD Initiated Contact with Patient 11/14/12 2022     Chief Complaint  Patient presents with  . Abdominal Pain  . Back Pain  . Diarrhea    Patient is a 27 y.o. female presenting with back pain. The history is provided by the patient. No language interpreter was used.  Back Pain Location:  Lumbar spine Quality:  Aching Radiates to:  L posterior upper leg and R posterior upper leg Pain severity:  Moderate Pain is:  Same all the time Onset quality:  Gradual Timing:  Constant Progression:  Worsening Chronicity:  New Relieved by:  Nothing Worsened by:  Nothing tried Ineffective treatments:  Heating pad Associated symptoms: abdominal pain    HPI Comments: Brianna Hughes is a 27 y.o. female who presents to the Emergency Department complaining of lower back pain that radiates down bilateral legs.  Pt is also experiencing abdominal pain, nausea, and diarrhea.  She denies fever.  Pt has h/o polyonephritis.  Pt has a heat patch in place which has provided no relief.    Past Medical History  Diagnosis Date  . Scoliosis   . Headache(784.0)   . Anxiety   . Depression   . Chronic back pain   . Chronic headaches   . Chronic ankle pain   . Chronic knee pain    Past Surgical History  Procedure Laterality Date  . Mouth surgery     Family History  Problem Relation Age of Onset  . Anesthesia problems Neg Hx   . Hypotension Neg Hx   . Malignant hyperthermia Neg Hx   . Pseudochol deficiency Neg Hx    History  Substance Use Topics  . Smoking status: Current Every Day Smoker -- 0.25 packs/day for 10 years    Types: Cigarettes  . Smokeless tobacco: Never Used  . Alcohol Use: No   OB History   Grav Para Term Preterm Abortions TAB SAB Ect Mult Living    2 2 2  0 0 0 0 0 0 2     Review of Systems  Gastrointestinal: Positive for nausea, abdominal pain and diarrhea. Negative for vomiting.  Musculoskeletal: Positive for back pain.  All other systems reviewed and are negative.    Allergies  Hydrocodone  Home Medications   Current Outpatient Rx  Name  Route  Sig  Dispense  Refill  . Aspirin-Salicylamide-Caffeine (BC HEADACHE) 325-95-16 MG TABS   Oral   Take 1 packet by mouth daily as needed. Pain         . cephALEXin (KEFLEX) 500 MG capsule   Oral   Take 1 capsule (500 mg total) by mouth 3 (three) times daily.   30 capsule   0   . diphenhydrAMINE (BENADRYL) 25 MG tablet   Oral   Take 25 mg by mouth every 6 (six) hours as needed for itching or allergies.         Marland Kitchen ibuprofen (ADVIL,MOTRIN) 200 MG tablet   Oral   Take 200 mg by mouth every 6 (six) hours as needed for pain.         . baclofen (LIORESAL) 10 MG tablet   Oral   Take 1 tablet (10 mg total) by mouth 3 (three) times daily.   21  each   0   . dexamethasone (DECADRON) 6 MG tablet      1 po bid with food   12 tablet   0   . diclofenac (VOLTAREN) 75 MG EC tablet   Oral   Take 1 tablet (75 mg total) by mouth 2 (two) times daily.   12 tablet   0   . etonogestrel (IMPLANON) 68 MG IMPL implant   Subcutaneous   Inject 1 each into the skin once.         . metoCLOPramide (REGLAN) 10 MG tablet   Oral   Take 1 tablet (10 mg total) by mouth every 6 (six) hours.   10 tablet   0    BP 100/61  Pulse 118  Temp(Src) 99.4 F (37.4 C) (Oral)  Resp 22  Ht 5\' 4"  (1.626 m)  Wt 120 lb (54.432 kg)  BMI 20.59 kg/m2  SpO2 98%  LMP 11/01/2012 Physical Exam  Nursing note and vitals reviewed. Constitutional: She is oriented to person, place, and time. She appears well-developed and well-nourished. No distress.  HENT:  Head: Normocephalic and atraumatic.  Eyes: EOM are normal.  Neck: Neck supple. No tracheal deviation present.  Cardiovascular: Normal rate.    Pulmonary/Chest: Effort normal. No respiratory distress.  Abdominal: There is tenderness (lower abdominal tenderness).  Musculoskeletal: Normal range of motion. She exhibits tenderness (mild lumbar spine tenderness).  Neurological: She is alert and oriented to person, place, and time.  Skin: Skin is warm and dry.  Psychiatric: She has a normal mood and affect. Her behavior is normal.    ED Course  Procedures  DIAGNOSTIC STUDIES: Oxygen Saturation is 98% on room air, normal by my interpretation.    COORDINATION OF CARE:  8:27 PM Discussed course of care with pt who understands and agrees.   Labs Reviewed  URINALYSIS, ROUTINE W REFLEX MICROSCOPIC - Abnormal; Notable for the following:    APPearance HAZY (*)    Hgb urine dipstick SMALL (*)    All other components within normal limits  URINE MICROSCOPIC-ADD ON - Abnormal; Notable for the following:    Squamous Epithelial / LPF FEW (*)    All other components within normal limits  CBC WITH DIFFERENTIAL - Abnormal; Notable for the following:    Neutrophils Relative % 97 (*)    Lymphocytes Relative 3 (*)    Lymphs Abs 0.2 (*)    Monocytes Relative 0 (*)    Monocytes Absolute 0.0 (*)    All other components within normal limits  BASIC METABOLIC PANEL - Abnormal; Notable for the following:    Potassium 2.9 (*)    Glucose, Bld 117 (*)    All other components within normal limits  PREGNANCY, URINE   No results found. No diagnosis found.  MDM   The chart was scribed for me under my direct supervision.  I personally performed the history, physical, and medical decision making and all procedures in the evaluation of this patient.Brianna Lennert, MD 11/14/12 [phone]

## 2012-12-15 ENCOUNTER — Other Ambulatory Visit (HOSPITAL_COMMUNITY): Payer: Self-pay | Admitting: Internal Medicine

## 2012-12-15 DIAGNOSIS — M549 Dorsalgia, unspecified: Secondary | ICD-10-CM

## 2012-12-18 ENCOUNTER — Ambulatory Visit (HOSPITAL_COMMUNITY)
Admission: RE | Admit: 2012-12-18 | Discharge: 2012-12-18 | Disposition: A | Discharge status: Home / Self Care | Payer: Medicaid Other | Source: Ambulatory Visit | Attending: Internal Medicine | Admitting: Internal Medicine

## 2012-12-18 DIAGNOSIS — M549 Dorsalgia, unspecified: Secondary | ICD-10-CM

## 2012-12-18 DIAGNOSIS — M545 Low back pain, unspecified: Secondary | ICD-10-CM | POA: Insufficient documentation

## 2013-01-30 ENCOUNTER — Ambulatory Visit: Payer: Medicaid Other | Admitting: Neurology

## 2013-03-05 ENCOUNTER — Inpatient Hospital Stay (HOSPITAL_COMMUNITY)
Admission: EM | Admit: 2013-03-05 | Discharge: 2013-03-08 | DRG: 813 | Disposition: A | Discharge status: Home / Self Care | Payer: Medicaid Other | Attending: Internal Medicine | Admitting: Internal Medicine

## 2013-03-05 ENCOUNTER — Inpatient Hospital Stay (HOSPITAL_COMMUNITY): Payer: Medicaid Other

## 2013-03-05 ENCOUNTER — Encounter (HOSPITAL_COMMUNITY): Payer: Self-pay | Admitting: Emergency Medicine

## 2013-03-05 DIAGNOSIS — D696 Thrombocytopenia, unspecified: Secondary | ICD-10-CM

## 2013-03-05 DIAGNOSIS — T50995A Adverse effect of other drugs, medicaments and biological substances, initial encounter: Secondary | ICD-10-CM | POA: Diagnosis present

## 2013-03-05 DIAGNOSIS — F1910 Other psychoactive substance abuse, uncomplicated: Secondary | ICD-10-CM

## 2013-03-05 DIAGNOSIS — F1110 Opioid abuse, uncomplicated: Secondary | ICD-10-CM | POA: Diagnosis present

## 2013-03-05 DIAGNOSIS — F1120 Opioid dependence, uncomplicated: Secondary | ICD-10-CM

## 2013-03-05 DIAGNOSIS — D638 Anemia in other chronic diseases classified elsewhere: Secondary | ICD-10-CM | POA: Diagnosis present

## 2013-03-05 DIAGNOSIS — F411 Generalized anxiety disorder: Secondary | ICD-10-CM | POA: Diagnosis present

## 2013-03-05 DIAGNOSIS — D649 Anemia, unspecified: Secondary | ICD-10-CM | POA: Diagnosis present

## 2013-03-05 DIAGNOSIS — N390 Urinary tract infection, site not specified: Secondary | ICD-10-CM

## 2013-03-05 DIAGNOSIS — R42 Dizziness and giddiness: Secondary | ICD-10-CM | POA: Diagnosis present

## 2013-03-05 DIAGNOSIS — D6959 Other secondary thrombocytopenia: Principal | ICD-10-CM | POA: Diagnosis present

## 2013-03-05 DIAGNOSIS — F17200 Nicotine dependence, unspecified, uncomplicated: Secondary | ICD-10-CM | POA: Diagnosis present

## 2013-03-05 DIAGNOSIS — R112 Nausea with vomiting, unspecified: Secondary | ICD-10-CM | POA: Diagnosis present

## 2013-03-05 DIAGNOSIS — R0609 Other forms of dyspnea: Secondary | ICD-10-CM | POA: Diagnosis present

## 2013-03-05 DIAGNOSIS — F3289 Other specified depressive episodes: Secondary | ICD-10-CM | POA: Diagnosis present

## 2013-03-05 DIAGNOSIS — Z720 Tobacco use: Secondary | ICD-10-CM

## 2013-03-05 DIAGNOSIS — R0989 Other specified symptoms and signs involving the circulatory and respiratory systems: Secondary | ICD-10-CM | POA: Diagnosis present

## 2013-03-05 DIAGNOSIS — F1990 Other psychoactive substance use, unspecified, uncomplicated: Secondary | ICD-10-CM

## 2013-03-05 DIAGNOSIS — R040 Epistaxis: Secondary | ICD-10-CM | POA: Diagnosis present

## 2013-03-05 DIAGNOSIS — F329 Major depressive disorder, single episode, unspecified: Secondary | ICD-10-CM | POA: Diagnosis present

## 2013-03-05 LAB — COMPREHENSIVE METABOLIC PANEL
ALT: 11 U/L (ref 0–35)
AST: 29 U/L (ref 0–37)
Albumin: 3.8 g/dL (ref 3.5–5.2)
Calcium: 9 mg/dL (ref 8.4–10.5)
Creatinine, Ser: 0.99 mg/dL (ref 0.50–1.10)
GFR calc non Af Amer: 78 mL/min — ABNORMAL LOW (ref >90)
Sodium: 134 mEq/L — ABNORMAL LOW (ref 135–145)
Total Protein: 7.6 g/dL (ref 6.0–8.3)

## 2013-03-05 LAB — CBC WITH DIFFERENTIAL/PLATELET
Basophils Absolute: 0 10*3/uL (ref 0.0–0.1)
Eosinophils Absolute: 0.1 10*3/uL (ref 0.0–0.7)
Hemoglobin: 8.4 g/dL — ABNORMAL LOW (ref 12.0–15.0)
Lymphocytes Relative: 24 % (ref 12–46)
MCH: 30 pg (ref 26.0–34.0)
MCHC: 34.4 g/dL (ref 30.0–36.0)
Monocytes Absolute: 0.6 10*3/uL (ref 0.1–1.0)
Neutrophils Relative %: 69 % (ref 43–77)
Platelets: 16 10*3/uL — CL (ref 150–400)

## 2013-03-05 LAB — URINALYSIS, ROUTINE W REFLEX MICROSCOPIC
Glucose, UA: NEGATIVE mg/dL
Specific Gravity, Urine: <1.005 — ABNORMAL LOW (ref 1.005–1.030)
Urobilinogen, UA: 0.2 mg/dL (ref 0.0–1.0)
pH: 6 (ref 5.0–8.0)

## 2013-03-05 LAB — URINE MICROSCOPIC-ADD ON

## 2013-03-05 LAB — PREGNANCY, URINE: Preg Test, Ur: NEGATIVE

## 2013-03-05 MED ORDER — AMITRIPTYLINE HCL 50 MG PO TABS
50.0000 mg | ORAL_TABLET | Freq: Every day | ORAL | Status: DC
  Administered 2013-03-05 – 2013-03-07 (×3): 50 mg via ORAL
  Filled 2013-03-05 (×4): qty 1

## 2013-03-05 MED ORDER — IBUPROFEN 200 MG PO TABS
200.0000 mg | ORAL_TABLET | Freq: Four times a day (QID) | ORAL | Status: DC | PRN
  Administered 2013-03-06 – 2013-03-07 (×3): 200 mg via ORAL
  Filled 2013-03-05 (×3): qty 1

## 2013-03-05 MED ORDER — BUPRENORPHINE HCL 2 MG SL SUBL
4.0000 mg | SUBLINGUAL_TABLET | Freq: Every day | SUBLINGUAL | Status: DC
  Administered 2013-03-06 – 2013-03-08 (×3): 4 mg via SUBLINGUAL
  Filled 2013-03-05 (×3): qty 2

## 2013-03-05 MED ORDER — BUPRENORPHINE HCL-NALOXONE HCL 2-0.5 MG SL SUBL: 2.0000 | SUBLINGUAL_TABLET | Freq: Every morning | SUBLINGUAL | Status: DC

## 2013-03-05 NOTE — ED Notes (Signed)
Pt says she feels "light headed , dizzy and dehydrated".  Brought in by EMS

## 2013-03-05 NOTE — ED Notes (Signed)
Lab called critical platelet count 16.  Notified edp

## 2013-03-05 NOTE — ED Provider Notes (Signed)
CSN: [id number]     Arrival date & time 03/05/13  1545 History   First MD Initiated Contact with Patient 03/05/13 1605     Chief Complaint  Patient presents with  . Dizziness   (Consider location/radiation/quality/duration/timing/severity/associated sxs/prior Treatment) HPI Patient presents emergency department complaining of feeling dizzy and lightheaded for the past 3 days. She states 2 days ago she stood up quickly when she was using the bathroom and her vision went black and she felt like she was going to pass out. She states she did have nausea and vomiting but she did not have loss of consciousness. She has had some nausea off and on without vomiting since that episode. She denies diarrhea or frequency. She states she has an addiction to opiates and she takes oxycodone up to 150 mg a day. She does do IV use. She states she started in a treatment center last week and is on Suboxone. She states she is doing well without having withdrawal symptoms although patient is noted to have fresh track marks on the dorsum of both hands. She denies any fever, she states she does have a vaginal odor without discharge. She thinks she may have a UTI. Patient is G2 P2 AB 0. Her last normal period was in July when she had a hormone implant inserted. She's not had a menses since then. She reports her treatment doctor thought her thyroid was enlarged.  PCP Dr Sherwood Gambler, has appt in the morning  Past Medical History  Diagnosis Date  . Scoliosis   . Headache(784.0)   . Anxiety   . Depression   . Chronic back pain   . Chronic headaches   . Chronic ankle pain   . Chronic knee pain    Past Surgical History  Procedure Laterality Date  . Mouth surgery     Family History  Problem Relation Age of Onset  . Anesthesia problems Neg Hx   . Hypotension Neg Hx   . Malignant hyperthermia Neg Hx   . Pseudochol deficiency Neg Hx    History  Substance Use Topics  . Smoking status: Current Every Day Smoker -- 0.25  packs/day for 10 years    Types: Cigarettes  . Smokeless tobacco: Never Used  . Alcohol Use: No   Unemployed Lives with mother and child   OB History   Grav Para Term Preterm Abortions TAB SAB Ect Mult Living   2 2 2  0 0 0 0 0 0 2     Review of Systems  All other systems reviewed and are negative.    Allergies  Hydrocodone  Home Medications   Current Outpatient Rx  Name  Route  Sig  Dispense  Refill  . amitriptyline (ELAVIL) 50 MG tablet   Oral   Take 50 mg by mouth at bedtime.         . Aspirin-Salicylamide-Caffeine (BC HEADACHE) 325-95-16 MG TABS   Oral   Take 1 packet by mouth daily as needed. Pain         . buprenorphine-naloxone (SUBOXONE) 2-0.5 MG SUBL SL tablet   Sublingual   Place 2 tablets under the tongue every morning.         . cephALEXin (KEFLEX) 500 MG capsule   Oral   Take 500 mg by mouth daily.         Marland Kitchen etonogestrel (IMPLANON) 68 MG IMPL implant   Subcutaneous   Inject 1 each into the skin once.         Marland Kitchen  ibuprofen (ADVIL,MOTRIN) 200 MG tablet   Oral   Take 200 mg by mouth every 6 (six) hours as needed for pain.          BP 125/87  Pulse 101  Temp(Src) 98.8 F (37.1 C) (Oral)  Resp 16  Ht 5\' 5"  (1.651 m)  Wt 125 lb (56.7 kg)  BMI 20.8 kg/m2  SpO2 100%  LMP 12/03/2012  Breastfeeding? No  Vital signs normal except borderline tachycardia  Physical Exam  Nursing note and vitals reviewed. Constitutional: She is oriented to person, place, and time. She appears well-developed and well-nourished.  Non-toxic appearance. She does not appear ill. No distress.  HENT:  Head: Normocephalic and atraumatic.  Right Ear: External ear normal.  Left Ear: External ear normal.  Nose: Nose normal. No mucosal edema or rhinorrhea.  Mouth/Throat: Oropharynx is clear and moist and mucous membranes are normal. No dental abscesses or uvula swelling.  Eyes: Conjunctivae and EOM are normal. Pupils are equal, round, and reactive to light.  Neck:  Normal range of motion and full passive range of motion without pain. Neck supple.  Cardiovascular: Normal rate, regular rhythm and normal heart sounds.  Exam reveals no gallop and no friction rub.   No murmur heard. Pulmonary/Chest: Effort normal and breath sounds normal. No respiratory distress. She has no wheezes. She has no rhonchi. She has no rales. She exhibits no tenderness and no crepitus.  Abdominal: Soft. Normal appearance and bowel sounds are normal. She exhibits no distension. There is no tenderness. There is no rebound and no guarding.  Musculoskeletal: Normal range of motion. She exhibits no edema and no tenderness.  Moves all extremities well.   Neurological: She is alert and oriented to person, place, and time. She has normal strength. No cranial nerve deficit.  Skin: Skin is warm, dry and intact. No rash noted. No erythema. There is pallor.  Fresh track marks on the dorsum of her hands. She is noted to have bruising where blood work temp was done today.  Psychiatric: She has a normal mood and affect. Her speech is normal and behavior is normal. Her mood appears not anxious.    ED Course  Procedures (including critical care time)  Patient given results of her tests and need for admission.   18:23 Dr Janee Morn feels patient should go to either [facility] or [facility], not sure which.   19:13 Dr Bertis Ruddy, hematologist, states to have admitted to [facility], suggests adding hepatitis screen, HIV, folic acid and Vitamin B12 to her labs.   19:56 Dr Gonzella Lex admit to [facility], med-surg  Patient now states she's concerned about her Suboxone. She states although she told me initially she was not having any withdrawal symptoms but she's concerned because they started her Suboxone on a low dose and they were planning to increase her dose from the 2 mg up to 16 mg.  Labs Review Results for orders placed during the hospital encounter of 03/05/13  CBC WITH DIFFERENTIAL      Result Value Range   WBC 9.3  4.0 - 10.5  K/uL   RBC 2.80 (*) 3.87 - 5.11 MIL/uL   Hemoglobin 8.4 (*) 12.0 - 15.0 g/dL   HCT 40.9 (*) 81.1 - 91.4 %   MCV 87.1  78.0 - 100.0 fL   MCH 30.0  26.0 - 34.0 pg   MCHC 34.4  30.0 - 36.0 g/dL   RDW 78.2  95.6 - 21.3 %   Platelets 16 (*) 150 - 400 K/uL  Neutrophils Relative % 69  43 - 77 %   Lymphocytes Relative 24  12 - 46 %   Monocytes Relative 6  3 - 12 %   Eosinophils Relative 1  0 - 5 %   Basophils Relative 0  0 - 1 %   Neutro Abs 6.4  1.7 - 7.7 K/uL   Lymphs Abs 2.2  0.7 - 4.0 K/uL   Monocytes Absolute 0.6  0.1 - 1.0 K/uL   Eosinophils Absolute 0.1  0.0 - 0.7 K/uL   Basophils Absolute 0.0  0.0 - 0.1 K/uL   WBC Morphology ATYPICAL LYMPHOCYTES     Smear Review LARGE PLATELETS PRESENT    COMPREHENSIVE METABOLIC PANEL      Result Value Range   Sodium 134 (*) 135 - 145 mEq/L   Potassium 3.8  3.5 - 5.1 mEq/L   Chloride 101  96 - 112 mEq/L   CO2 22  19 - 32 mEq/L   Glucose, Bld 103 (*) 70 - 99 mg/dL   BUN 23  6 - 23 mg/dL   Creatinine, Ser 1.61  0.50 - 1.10 mg/dL   Calcium 9.0  8.4 - 09.6 mg/dL   Total Protein 7.6  6.0 - 8.3 g/dL   Albumin 3.8  3.5 - 5.2 g/dL   AST 29  0 - 37 U/L   ALT 11  0 - 35 U/L   Alkaline Phosphatase 69  39 - 117 U/L   Total Bilirubin 1.4 (*) 0.3 - 1.2 mg/dL   GFR calc non Af Amer 78 (*) >90 mL/min   GFR calc Af Amer >90  >90 mL/min  URINALYSIS, ROUTINE W REFLEX MICROSCOPIC      Result Value Range   Color, Urine YELLOW  YELLOW   APPearance CLEAR  CLEAR   Specific Gravity, Urine <1.005 (*) 1.005 - 1.030   pH 6.0  5.0 - 8.0   Glucose, UA NEGATIVE  NEGATIVE mg/dL   Hgb urine dipstick SMALL (*) NEGATIVE   Bilirubin Urine NEGATIVE  NEGATIVE   Ketones, ur NEGATIVE  NEGATIVE mg/dL   Protein, ur 30 (*) NEGATIVE mg/dL   Urobilinogen, UA 0.2  0.0 - 1.0 mg/dL   Nitrite NEGATIVE  NEGATIVE   Leukocytes, UA SMALL (*) NEGATIVE  PREGNANCY, URINE      Result Value Range   Preg Test, Ur NEGATIVE  NEGATIVE  URINE MICROSCOPIC-ADD ON      Result Value Range    Squamous Epithelial / LPF RARE  RARE   WBC, UA 7-10  <3 WBC/hpf   RBC / HPF 3-6  <3 RBC/hpf   Bacteria, UA FEW (*) RARE   Laboratory interpretation all normal except new anemia, hemoglobin was 12 in July, new thrombocytopenia since July, possible UTI       MDM   1. Anemia   2. Thrombocytopenia   3. IV drug abuse   4. Opiate addiction   5. UTI (urinary tract infection)    Plan admission to [facility] for further hematological evaluation   Devoria Albe, MD, FACEP  CRITICAL CARE Performed by: Devoria Albe L Total critical care time: 31 min Critical care time was exclusive of separately billable procedures and treating other patients. Critical care was necessary to treat or prevent imminent or life-threatening deterioration. Critical care was time spent personally by me on the following activities: development of treatment plan with patient and/or surrogate as well as nursing, discussions with consultants, evaluation of patient's response to treatment, examination of patient, obtaining history from patient  or surrogate, ordering and performing treatments and interventions, ordering and review of laboratory studies, ordering and review of radiographic studies, pulse oximetry and re-evaluation of patient's condition.     Ward Givens, MD 03/05/13 2008

## 2013-03-05 NOTE — H&P (Signed)
[facility] History and Physical  Brianna Hughes:[id number] DOB: 08/31/1985 DOA: 03/05/2013  Referring physician: ED PCP: Cassell Smiles., MD  Chief Complaint: Dizziness, DOE  HPI: Brianna Hughes is a 27 y.o. female who presents to the ED at [facility] with c/o feeling dizzy and lightheaded for past 3 days.  Also has had DOE and SOB.  This occurs in the context of a h/o IVDU and recently being started on suboxone just last week.  She was doing well without withdrawal symptoms although she was noted in the ED to have fresh track marks on the dorsum of both hands.  She had 1 fever of 99.9 last week she states.  Work up in the ED at [facility] was remarkable for new thrombocytopenia with platelet count of 19 down from over 200 earlier this year, she is also anemic at 8.9, her only source of bleeding recently was nose bleeds.  Review of Systems: 12 systems reviewed and otherwise negative.  Past Medical History  Diagnosis Date  . Scoliosis   . Headache(784.0)   . Anxiety   . Depression   . Chronic back pain   . Chronic headaches   . Chronic ankle pain   . Chronic knee pain    Past Surgical History  Procedure Laterality Date  . Mouth surgery     Social History:  reports that she has been smoking Cigarettes.  She has a 2.5 pack-year smoking history. She has never used smokeless tobacco. She reports that she does not drink alcohol or use illicit drugs.  Allergies  Allergen Reactions  . Hydrocodone Nausea And Vomiting    Family History  Problem Relation Age of Onset  . Anesthesia problems Neg Hx   . Hypotension Neg Hx   . Malignant hyperthermia Neg Hx   . Pseudochol deficiency Neg Hx     Prior to Admission medications   Medication Sig Start Date End Date Taking? Authorizing Provider  amitriptyline (ELAVIL) 50 MG tablet Take 50 mg by mouth at bedtime.   Yes Historical Provider, MD  Aspirin-Salicylamide-Caffeine (BC HEADACHE) 325-95-16 MG TABS Take 1 packet by mouth daily as needed.  Pain   Yes Historical Provider, MD  buprenorphine-naloxone (SUBOXONE) 2-0.5 MG SUBL SL tablet Place 2 tablets under the tongue every morning.   Yes Historical Provider, MD  cephALEXin (KEFLEX) 500 MG capsule Take 500 mg by mouth daily.   Yes Historical Provider, MD  etonogestrel (IMPLANON) 68 MG IMPL implant Inject 1 each into the skin once.    Historical Provider, MD  ibuprofen (ADVIL,MOTRIN) 200 MG tablet Take 200 mg by mouth every 6 (six) hours as needed for pain.    Historical Provider, MD   Physical Exam: Filed Vitals:   03/05/13 1929  BP: 113/85  Pulse: 94  Temp:   Resp: 20    General:  NAD, resting comfortably in bed Eyes: PEERLA EOMI ENT: mucous membranes moist Neck: supple w/o JVD Cardiovascular: RRR w/o MRG Respiratory: CTA B Abdomen: soft, nt, nd, bs+ Skin: bruising at IV sites Musculoskeletal: MAE, full ROM all 4 extremities Psychiatric: normal tone and affect Neurologic: AAOx3, grossly non-focal  Labs on Admission:  Basic Metabolic Panel:  Recent Labs Lab 03/05/13 1657  NA 134*  K 3.8  CL 101  CO2 22  GLUCOSE 103*  BUN 23  CREATININE 0.99  CALCIUM 9.0   Liver Function Tests:  Recent Labs Lab 03/05/13 1657  AST 29  ALT 11  ALKPHOS 69  BILITOT 1.4*  PROT 7.6  ALBUMIN 3.8   No results found for this basename: LIPASE, AMYLASE,  in the last 168 hours No results found for this basename: AMMONIA,  in the last 168 hours CBC:  Recent Labs Lab 03/05/13 1657  WBC 9.3  NEUTROABS 6.4  HGB 8.4*  HCT 24.4*  MCV 87.1  PLT 16*   Cardiac Enzymes: No results found for this basename: CKTOTAL, CKMB, CKMBINDEX, TROPONINI,  in the last 168 hours  BNP (last 3 results) No results found for this basename: PROBNP,  in the last 8760 hours CBG: No results found for this basename: GLUCAP,  in the last 168 hours  Radiological Exams on Admission: No results found.  EKG: Independently reviewed.  Assessment/Plan Principal Problem:   Thrombocytopenia,  unspecified Active Problems:   Anemia   IVDU (intravenous drug user)   1. Thrombocytopenia - unclear cause, can certainly rule out TTP-HUS at this point as patients kidney function and mental status are normal, ITP remains a strong consideration.  Although she was just started on suboxone last week, I am un-aware of any data that this can cause thrombocytopenia as one of its drug side effects.  Bacterial infection remains a possibility especially with patients h/o IVDU and so have added blood cultures to the work up.  Will also add CXR to the patients work up and 2D echo given the recent DOE. 2. Anemia - either also related to BMS with thrombocytopenia, or could also be related to nose bleeds over the past week.  Repeat CBC in AM. 3. IVDU - continue suboxone  Hematology was consulted by ED and recommended patient be admitted to [facility] for thrombocytopenia work up.  Additional labs including HIV, HCV, B12 and Folate have been ordered.  Code Status: Full (must indicate code status--if unknown or must be presumed, indicate so) Family Communication: No family in room (indicate person spoken with, if applicable, with phone number if by telephone) Disposition Plan: Admit to inpatient (indicate anticipated LOS)  Time spent: 70 min  Brianna Hughes M. [facility] Pager [phone]  If 7PM-7AM, please contact night-coverage [email or web address] Password [email or web address] 03/05/2013, 10:43 PM

## 2013-03-06 DIAGNOSIS — R011 Cardiac murmur, unspecified: Secondary | ICD-10-CM

## 2013-03-06 LAB — URINE CULTURE

## 2013-03-06 LAB — DIC (DISSEMINATED INTRAVASCULAR COAGULATION)PANEL
D-Dimer, Quant: 0.33 ug/mL-FEU (ref 0.00–0.48)
INR: 1.06 (ref 0.00–1.49)
Prothrombin Time: 13.6 seconds (ref 11.6–15.2)

## 2013-03-06 LAB — FOLATE: Folate: >20 ng/mL

## 2013-03-06 LAB — CBC
HCT: 23.5 % — ABNORMAL LOW (ref 36.0–46.0)
Hemoglobin: 8.5 g/dL — ABNORMAL LOW (ref 12.0–15.0)
MCH: 31.4 pg (ref 26.0–34.0)
MCHC: 36.2 g/dL — ABNORMAL HIGH (ref 30.0–36.0)
RBC: 2.71 MIL/uL — ABNORMAL LOW (ref 3.87–5.11)
WBC: 7.3 10*3/uL (ref 4.0–10.5)

## 2013-03-06 LAB — LACTATE DEHYDROGENASE: LDH: 642 U/L — ABNORMAL HIGH (ref 94–250)

## 2013-03-06 LAB — HEPATITIS PANEL, ACUTE
HCV Ab: NEGATIVE
Hep A IgM: NONREACTIVE
Hepatitis B Surface Ag: NEGATIVE

## 2013-03-06 LAB — DIRECT ANTIGLOBULIN TEST (NOT AT ARMC): DAT, complement: NEGATIVE

## 2013-03-06 LAB — HIV ANTIBODY (ROUTINE TESTING W REFLEX): HIV: NONREACTIVE

## 2013-03-06 MED ORDER — ONDANSETRON HCL 4 MG PO TABS
4.0000 mg | ORAL_TABLET | Freq: Three times a day (TID) | ORAL | Status: DC | PRN
  Administered 2013-03-06: 4 mg via ORAL
  Filled 2013-03-06: qty 1

## 2013-03-06 MED ORDER — ACETAMINOPHEN 325 MG PO TABS
650.0000 mg | ORAL_TABLET | Freq: Four times a day (QID) | ORAL | Status: DC | PRN
  Administered 2013-03-06 – 2013-03-08 (×4): 650 mg via ORAL
  Filled 2013-03-06 (×4): qty 2

## 2013-03-06 NOTE — Progress Notes (Addendum)
[facility] PROGRESS NOTE  Brianna Hughes MVH:[id number] DOB: [birth year] DOA: 03/05/2013 PCP: Cassell Smiles., MD  HPI: Brianna Hughes is a 27 y.o. female who presents to the ED at [facility] with c/o feeling dizzy and lightheaded for past 3 days. Also has had DOE and SOB. This occurs in the context of a h/o IVDU and recently being started on suboxone just last week. She was doing well without withdrawal symptoms although she was noted in the ED to have fresh track marks on the dorsum of both hands. She had 1 fever of 99.9 last week she states. Work up in the ED at [facility] was remarkable for new thrombocytopenia with platelet count of 19 down from over 200 earlier this year, she is also anemic at 8.9, her only source of bleeding recently was nose bleeds.  Assessment/Plan: Thrombocytopenia - unclear cause, unlikely TTP-HUS at this point as patients kidney function and mental status are normal, ITP remains a strong consideration.  - hematology consulted, appreciate input, discussed with Dr. Clelia Croft - HIV pending, blood cultures pending. Last week she ran a low grade temp of 99.9 - bacterial infection remains a possibility especially with patients h/o IVDU and so have added blood cultures to the work up.  - 2D echo given the recent DOE and IVDU - obtain smear Anemia - either also related to BMS with thrombocytopenia, or could also be related to nose bleeds over the past week. Repeat CBC in AM.  IVDU - continue suboxone  Diet: regular Fluids: none DVT Prophylaxis: SCDs  Code Status: Full Family Communication: none  Disposition Plan: inpatient  Consultants:  Hematology  Procedures:  none   Antibiotics  Anti-infectives   None     Antibiotics Given (last 72 hours)   None      HPI/Subjective: - feeling well this morning  Objective: Filed Vitals:   03/05/13 1700 03/05/13 1929 03/05/13 2200 03/06/13 0600  BP: 125/87 113/85 116/81 112/75  Pulse: 101 94 77 71  Temp:   98.4 F (36.9  C) 98.4 F (36.9 C)  TempSrc:   Oral Oral  Resp:  20 18 18   Height:      Weight:   56.654 kg (124 lb 14.4 oz)   SpO2:  100% 100% 100%   No intake or output data in the 24 hours ending 03/06/13 0730 Filed Weights   03/05/13 1601 03/05/13 2200  Weight: 56.7 kg (125 lb) 56.654 kg (124 lb 14.4 oz)    Exam:   General:  NAD  Cardiovascular: regular rate and rhythm, without MRG  Respiratory: good air movement, clear to auscultation throughout, no wheezing, ronchi or rales  Abdomen: soft, not tender to palpation, positive bowel sounds  MSK: no peripheral edema  Neuro: CN 2-12 grossly intact, MS 5/5 in all 4  Data Reviewed: Basic Metabolic Panel:  Recent Labs Lab 03/05/13 1657  NA 134*  K 3.8  CL 101  CO2 22  GLUCOSE 103*  BUN 23  CREATININE 0.99  CALCIUM 9.0   Liver Function Tests:  Recent Labs Lab 03/05/13 1657  AST 29  ALT 11  ALKPHOS 69  BILITOT 1.4*  PROT 7.6  ALBUMIN 3.8   CBC:  Recent Labs Lab 03/05/13 1657 03/06/13 0534  WBC 9.3 7.3  NEUTROABS 6.4  --   HGB 8.4* 8.5*  HCT 24.4* 23.5*  MCV 87.1 86.7  PLT 16* 19*    Studies: Dg Chest 1 View  03/06/2013   CLINICAL DATA:  Shortness of Breath.  EXAM: CHEST - 1 VIEW  COMPARISON:  10/31/2012  FINDINGS: The heart size and mediastinal contours are within normal limits. Both lungs are clear. The visualized skeletal structures are unremarkable. Abdomen was shielded. No effusion.  IMPRESSION: No active disease.   Electronically Signed   By: Oley Balm M.D.   On: 03/06/2013 01:09    Scheduled Meds: . amitriptyline  50 mg Oral QHS  . buprenorphine  4 mg Sublingual Daily   Continuous Infusions:   Principal Problem:   Thrombocytopenia, unspecified Active Problems:   Anemia   IVDU (intravenous drug user)  Time spent: 35  Pamella Pert, MD [facility] Pager [phone]. If 7 PM - 7 AM, please contact night-coverage at [email or web address], password [facility] 03/06/2013, 7:30 AM  LOS: 1 day

## 2013-03-06 NOTE — Progress Notes (Signed)
  Echocardiogram 2D Echocardiogram has been performed.  Brianna Hughes 03/06/2013, 1:10 PM

## 2013-03-06 NOTE — Progress Notes (Signed)
CRITICAL VALUE ALERT  Critical value received: Platelet 19  Date of notification:  03/06/13  Time of notification:  0654  Critical value read back:yes  Nurse who received alert: Sherlon Handing  MD notified (1st page): Dr. Julian Reil  Time of first page:  [phone]  MD notified (2nd page):  Time of second page:  Responding MD:  Dr.Gardner  Time MD responded:  [phone]

## 2013-03-06 NOTE — Consult Note (Signed)
[facility]  [phone]) [phone]   ONCOLOGY  HOSPITAL CONSULTATION NOTE  Brianna Hughes                                MR#: [id number]  DOB: 10/03/1985                       CSN#: [id number]  Referring MD: [facility]   Primary MD: Dr.Fusco  Reason for Consult: Thrombocytopenia   XLK:GMWNUU Brianna Hughes is Brianna 27 y.o.  female   With Brianna history of opiate addiction ( oxycodone 150 mg/d) transferred from [facility] to [facility] for management of nose bleeds and  SOB one week after IV drug use with suboxone-naloxone 2 mg  for treatment, as well as self administered Keflex doses for treatment of  "vaginal infection" asked to see for evaluation of thrombocytopenia.   She presented to [facility] ED with 3 day history of dizziness, with one episode reversible loss of vision 2 days prior shortness of breath, nausea without vomiting,"fresh track marks on the dorsum of both hands" Suboxone was discontinued, and she was placed on Subutex 4 mg sl.   H/H on admission was 8.4/24 in the setting of bleeding. MCV was 87.1 and Paltelets were severely low at 16k. Atypical lymphs with large platelets were present in smear, without schistocytes. UA showed small Hgb, 30 of protein, neg for nitrites and small leukocytes. Today's CBC show H/H 8.5/23.5 and platelets 19k.Of note, as of 11/14/12 the  plaltelets were 284 k.  No protime was available for review.    Hepatitis screen, HIV, folic acid and Vitamin B12 to her labs are currently pending. Blood cultures and urine cultures pending. Creatinine normal.   No family history of hematological disorders. No gum bleed. No hemoptysis. Denies easy bruising prior to this event.  No ASA or  NSAIDs.  No Lovenox initiated in hospital for VTE prophylaxis- she wears SCDs-. Patient states that has never had Brianna hematological evaluation prior to this admission. Never had Brianna bone marrow biopsy. No heavy periods, but due to implantable birth control, they are quarterly.  We were  kindly asked to see the patient with recommendations  PMH:  Past Medical History  Diagnosis Date  . Scoliosis   . Headache(784.0)   . Anxiety   . Depression   . Chronic back pain secondary to scoliosis requiring pain medicine   . Chronic headaches   . Chronic ankle pain   . Chronic knee pain     Surgeries:  Past Surgical History  Procedure Laterality Date  . Mouth surgery      R ankle bone biopsy, benign  Allergies:  Allergies  Allergen Reactions  . Hydrocodone Nausea And Vomiting    Medications:    prior to admission:  Prescriptions prior to admission  Medication Sig Dispense Refill  . amitriptyline (ELAVIL) 50 MG tablet Take 50 mg by mouth at bedtime.      . Aspirin-Salicylamide-Caffeine (BC HEADACHE) 325-95-16 MG TABS Take 1 packet by mouth daily as needed. Pain      . buprenorphine-naloxone (SUBOXONE) 2-0.5 MG SUBL SL tablet Place 2 tablets under the tongue every morning.      . cephALEXin (KEFLEX) 500 MG capsule Take 500 mg by mouth daily.      Marland Kitchen etonogestrel (IMPLANON) 68 MG IMPL implant Inject 1 each into the skin once.      Marland Kitchen  ibuprofen (ADVIL,MOTRIN) 200 MG tablet Take 200 mg by mouth every 6 (six) hours as needed for pain.       Current Meds:  . amitriptyline  50 mg Oral QHS  . buprenorphine  4 mg Sublingual Daily    ZOX:[id number]  ROS: See HPI for significant positives Rest of ROS negative Constitutional:  Negative for weight loss. Negative  for fever at this time, had one episode of low grade fever of 99.9 one week ago. No, chills or  night sweats Eyes:as per HPI Respiratory: Negative for cough. No hemoptysis. Intermittent shortness of breath. No pleuritic chest pain.  Cardiovascular: Negative for chest pain. No palpitations.  GI: As per hPI GU: Negative for hematuria. No loss of urinary control.No urinary retention.She had Brianna recent UTI one month prior. Currently she reports Brianna vaginal infection.  Skin: As per HPI Neurological: No headaches. No motor  or sensory deficits.No seizures.  Musculoskeletal: multiple areas of pain, including back, knee, R ankle and R shoulder. Chronic .  Family History:    Family History  Problem Relation Age of Onset  . Anesthesia problems Neg Hx   . Hypotension Neg Hx   . Malignant hyperthermia Neg Hx   . Pseudochol deficiency Neg Hx     No family history of bleeding disorders.   Social History:  reports that she has been smoking Cigarettes.  She has Brianna 2.5 pack-year smoking history. She has never used smokeless tobacco. She reports that she does not drink alcohol or use illicit drugs. Single, 2 children.Unempployed.  Lives in [place].   Physical Exam    Filed Vitals:   03/06/13 1313  BP: 105/77  Pulse: 91  Temp: 98.9 F (37.2 C)  Resp: 18     Filed Weights   03/05/13 1601 03/05/13 2200  Weight: 125 lb (56.7 kg) 124 lb 14.4 oz (56.654 kg)    General:  73 -year-old wf in no acute distress Brianna. and O. x3  well-developed and ill-appearing HEENT: Normocephalic, atraumatic, PERRLA. Oral cavity without thrush or lesions. No gingival bleeding. Neck supple. no thyromegaly, no cervical or supraclavicular adenopathy  Lungs clear bilaterally . No wheezing, rhonchi or rales. No axillary masses. Breasts: not examined. Cardiac regular rate and rhythm,no murmur , rubs or gallops Abdomen soft nontender , bowel sounds x4. No HSM. No masses palpable.  GU/rectal: deferred. Extremities no clubbing cyanosis or edema. Minimal  Bruising at the venipuncture sites without petechial rash Musculoskeletal: no spinal tenderness.  Neuro: Non Focal  Labs:     Recent Labs Lab 03/05/13 1657 03/06/13 0534  WBC 9.3 7.3  HGB 8.4* 8.5*  HCT 24.4* 23.5*  PLT 16* 19*  MCV 87.1 86.7  MCH 30.0 31.4  MCHC 34.4 36.2*  RDW 15.4 15.5  LYMPHSABS 2.2  --   MONOABS 0.6  --   EOSABS 0.1  --   BASOSABS 0.0  --          Recent Labs Lab 03/05/13 1657  NA 134*  K 3.8  CL 101  CO2 22  GLUCOSE 103*  BUN 23    CREATININE 0.99  CALCIUM 9.0  AST 29  ALT 11  ALKPHOS 69  BILITOT 1.4*        Component Value Date/Time   BILITOT 1.4* 03/05/2013 1657     No results found for this basename: INR, PROTIME,  in the last 168 hours  No results found for this basename: DDIMER,  in the last 72 hours   Anemia panel:  No results found for this basename: VITAMINB12, FOLATE, FERRITIN, TIBC, IRON, RETICCTPCT,  in the last 72 hours  Urinalysis    Component Value Date/Time   COLORURINE YELLOW 03/05/2013 1705   APPEARANCEUR CLEAR 03/05/2013 1705   LABSPEC <1.005* 03/05/2013 1705   PHURINE 6.0 03/05/2013 1705   GLUCOSEU NEGATIVE 03/05/2013 1705   HGBUR SMALL* 03/05/2013 1705   BILIRUBINUR NEGATIVE 03/05/2013 1705   KETONESUR NEGATIVE 03/05/2013 1705   PROTEINUR 30* 03/05/2013 1705   UROBILINOGEN 0.2 03/05/2013 1705   NITRITE NEGATIVE 03/05/2013 1705   LEUKOCYTESUR SMALL* 03/05/2013 1705    Drugs of Abuse     Component Value Date/Time   LABOPIA NONE DETECTED 10/13/2011 1515   COCAINSCRNUR NONE DETECTED 10/13/2011 1515   LABBENZ NONE DETECTED 10/13/2011 1515   AMPHETMU NONE DETECTED 10/13/2011 1515   THCU NONE DETECTED 10/13/2011 1515   LABBARB NONE DETECTED 10/13/2011 1515     Imaging Studies:  Dg Chest 1 View  03/06/2013   CLINICAL DATA:  Shortness of Breath.  EXAM: CHEST - 1 VIEW  COMPARISON:  10/31/2012  FINDINGS: The heart size and mediastinal contours are within normal limits. Both lungs are clear. The visualized skeletal structures are unremarkable. Abdomen was shielded. No effusion.  IMPRESSION: No active disease.   Electronically Signed   By: Oley Balm M.D.   On: 03/06/2013 01:09      Brianna/P: 27 y.o. female asked to see for evaluation of thrombocytopenia manifested by acute epistaxis and easy bruising. Admission platelet count was 16k, now 19k. No transfusion was received. No Lovenox was given. Patient in no ASA or significant amount of NSAIDs. Smear remarkable for large platelets and  atypical lymphs.Creatinine normal. She has subsequent anemia. Check  DIC panel , DAT and LDH. Consider Anemia panel.   Dr. Clelia Croft    is to see the patient following this consult with recommendations regarding diagnosis and  further workup studies. An addendum to this note is to be written.    Thank you for the referral.  Marcos Eke, PA-C 03/06/2013 1:37 PM    Patient seen and examined. Please see details above.   Brianna 27 year old with new onset of thrombocytopenia that is asymptomatic. She reports no bleeding complications.  No alteration in mentation or acute illness.   Exam: No petechia or ecchymosis.   Smear reviewed personally today and showed no schistocytes or RBC fragments.   Impression:   27 year old with thrombocytopenia and mild anemia. The DDx include IT, medication, drug use and less likely DIC, TTP/HUS or HIT.   Plan: We will check LDH, Haptoglobin and coombs test.  No need for transfusion now.  Please check CBC daily. If platelets drop further, we can consider steroids.

## 2013-03-07 DIAGNOSIS — F1120 Opioid dependence, uncomplicated: Secondary | ICD-10-CM

## 2013-03-07 DIAGNOSIS — Z720 Tobacco use: Secondary | ICD-10-CM

## 2013-03-07 LAB — TROPONIN I
Troponin I: <0.3 ng/mL (ref <0.30)
Troponin I: <0.3 ng/mL (ref <0.30)

## 2013-03-07 LAB — COMPREHENSIVE METABOLIC PANEL
AST: 25 U/L (ref 0–37)
BUN: 30 mg/dL — ABNORMAL HIGH (ref 6–23)
CO2: 21 mEq/L (ref 19–32)
Calcium: 9.6 mg/dL (ref 8.4–10.5)
Chloride: 106 mEq/L (ref 96–112)
Creatinine, Ser: 1.08 mg/dL (ref 0.50–1.10)
GFR calc Af Amer: 81 mL/min — ABNORMAL LOW (ref >90)
GFR calc non Af Amer: 70 mL/min — ABNORMAL LOW (ref >90)
Glucose, Bld: 100 mg/dL — ABNORMAL HIGH (ref 70–99)
Total Bilirubin: 1.1 mg/dL (ref 0.3–1.2)

## 2013-03-07 LAB — CBC
HCT: 24.8 % — ABNORMAL LOW (ref 36.0–46.0)
Hemoglobin: 8.9 g/dL — ABNORMAL LOW (ref 12.0–15.0)
MCH: 31.9 pg (ref 26.0–34.0)
MCV: 88.9 fL (ref 78.0–100.0)
Platelets: 42 10*3/uL — ABNORMAL LOW (ref 150–400)
RBC: 2.79 MIL/uL — ABNORMAL LOW (ref 3.87–5.11)

## 2013-03-07 MED ORDER — DIPHENHYDRAMINE HCL 25 MG PO CAPS
25.0000 mg | ORAL_CAPSULE | Freq: Once | ORAL | Status: AC | PRN
  Administered 2013-03-07: 25 mg via ORAL
  Filled 2013-03-07: qty 1

## 2013-03-07 NOTE — Progress Notes (Signed)
Lab results reviewed.  No evidence of DIC or hemolysis. Less likely TTP/HUS. Her platelet count is up again to 42 K. Very rare schistocytes noted but likely due acute illness.  No intervention is needed as her platelets are improving daily. This likely medication related and seems to be resolving.

## 2013-03-07 NOTE — Progress Notes (Signed)
[facility] PROGRESS NOTE  Brianna Hughes ZOX:[id number] DOB: 09-18-1985 DOA: 03/05/2013 PCP: Cassell Smiles., MD  Assessment/Plan: Thrombocytopenia -Suspect medication induced -few schistocytes noted on peripheral smear -Appreciate Dr. Clelia Croft -labs do not suggest DIC -Continues to improve -Viral hepatitis serologies, HIV serology negative Dyspnea on exertion/chest discomfort -Echocardiogram--EF 60-65%, no vegetation, no WMA -Cycle troponins -EKG negative for ST-T wave changes -D-Dimer neg -Wells' score 0 Opioid abuse/IVDU -continue suboxone Tobacco abuse -Tobacco cessation discussed Family Communication:   Pt at beside Disposition Plan:   Home when medically stable     Procedures/Studies: Dg Chest 1 View  03/06/2013   CLINICAL DATA:  Shortness of Breath.  EXAM: CHEST - 1 VIEW  COMPARISON:  10/31/2012  FINDINGS: The heart size and mediastinal contours are within normal limits. Both lungs are clear. The visualized skeletal structures are unremarkable. Abdomen was shielded. No effusion.  IMPRESSION: No active disease.   Electronically Signed   By: Oley Balm M.D.   On: 03/06/2013 01:09         Subjective: Patient complains of some mild dyspnea on exertion. She has intermittent chest discomfort with exertion. Denies fevers, chills, hemoptysis, nausea, vomiting, diarrhea, vomiting, dysuria, hematuria. No rashes. No epistaxis. No bleeding gums.  Objective: Filed Vitals:   03/06/13 1313 03/06/13 2200 03/07/13 0600 03/07/13 1259  BP: 105/77 108/73 92/58 113/75  Pulse: 91 76 69 86  Temp: 98.9 F (37.2 C) 98.4 F (36.9 C) 98.1 F (36.7 C) 97.8 F (36.6 C)  TempSrc: Oral Oral Oral Oral  Resp: 18 18 18 20   Height:      Weight:      SpO2: 100% 100% 100% 100%    Intake/Output Summary (Last 24 hours) at 03/07/13 1442 Last data filed at 03/06/13 1502  Gross per 24 hour  Intake    240 ml  Output      0 ml  Net    240 ml   Weight change:   Exam:   General:  Pt is alert, follows commands appropriately, not in acute distress  HEENT: No icterus, No thrush, shotty cervical lymphadenopathy, [place]/AT  Cardiovascular: RRR, S1/S2, no rubs, no gallops  Respiratory: CTA bilaterally, no wheezing, no crackles, no rhonchi  Abdomen: Soft/+BS, non tender, non distended, no guarding  Extremities: No edema, No lymphangitis, No petechiae, No rashes, no synovitis  Data Reviewed: Basic Metabolic Panel:  Recent Labs Lab 03/05/13 1657 03/07/13 0550  NA 134* 135  K 3.8 4.5  CL 101 106  CO2 22 21  GLUCOSE 103* 100*  BUN 23 30*  CREATININE 0.99 1.08  CALCIUM 9.0 9.6   Liver Function Tests:  Recent Labs Lab 03/05/13 1657 03/07/13 0550  AST 29 25  ALT 11 11  ALKPHOS 69 73  BILITOT 1.4* 1.1  PROT 7.6 7.9  ALBUMIN 3.8 4.0   No results found for this basename: LIPASE, AMYLASE,  in the last 168 hours No results found for this basename: AMMONIA,  in the last 168 hours CBC:  Recent Labs Lab 03/05/13 1657 03/06/13 0534 03/06/13 1507 03/07/13 0550  WBC 9.3 7.3  --  8.0  NEUTROABS 6.4  --   --   --   HGB 8.4* 8.5*  --  8.9*  HCT 24.4* 23.5*  --  24.8*  MCV 87.1 86.7  --  88.9  PLT 16* 19* 26* 42*   Cardiac Enzymes: No results found for this basename: CKTOTAL, CKMB, CKMBINDEX, TROPONINI,  in the last 168 hours BNP: No components found with  this basename: POCBNP,  CBG: No results found for this basename: GLUCAP,  in the last 168 hours  Recent Results (from the past 240 hour(s))  URINE CULTURE     Status: None   Collection Time    03/05/13  5:05 PM      Result Value Range Status   Specimen Description URINE, CLEAN CATCH   Final   Special Requests NONE   Final   Culture  Setup Time     Final   Value: 03/06/2013 01:52     Performed at [facility] Count     Final   Value: NO GROWTH     Performed at [facility]   Culture     Final   Value: NO GROWTH     Performed at [facility]    Report Status 03/06/2013 FINAL   Final  CULTURE, BLOOD (ROUTINE X 2)     Status: None   Collection Time    03/05/13 11:16 PM      Result Value Range Status   Specimen Description BLOOD LEFT ANTECUBITAL   Final   Special Requests BOTTLES DRAWN AEROBIC AND ANAEROBIC 5CC   Final   Culture  Setup Time     Final   Value: 03/06/2013 04:47     Performed at [facility]   Culture     Final   Value:        BLOOD CULTURE RECEIVED NO GROWTH TO DATE CULTURE WILL BE HELD FOR 5 DAYS BEFORE ISSUING A FINAL NEGATIVE REPORT     Performed at [facility]   Report Status PENDING   Incomplete  CULTURE, BLOOD (ROUTINE X 2)     Status: None   Collection Time    03/05/13 11:20 PM      Result Value Range Status   Specimen Description BLOOD LEFT ARM   Final   Special Requests BOTTLES DRAWN AEROBIC AND ANAEROBIC 2CC   Final   Culture  Setup Time     Final   Value: 03/06/2013 04:47     Performed at [facility]   Culture     Final   Value:        BLOOD CULTURE RECEIVED NO GROWTH TO DATE CULTURE WILL BE HELD FOR 5 DAYS BEFORE ISSUING A FINAL NEGATIVE REPORT     Performed at [facility]   Report Status PENDING   Incomplete     Scheduled Meds: . amitriptyline  50 mg Oral QHS  . buprenorphine  4 mg Sublingual Daily   Continuous Infusions:    Edgard Debord, DO  [facility] Pager [phone]  If 7PM-7AM, please contact night-coverage [email or web address] Password [email or web address] 03/07/2013, 2:42 PM   LOS: 2 days

## 2013-03-07 NOTE — Discharge Summary (Signed)
Physician Discharge Summary  Brianna Hughes NUU:[id number] DOB: 28-Mar-1986 DOA: 03/05/2013  PCP: Cassell Smiles., MD  Admit date: 03/05/2013 Discharge date: 03/08/2013  Recommendations for Outpatient Follow-up:  1. Pt will need to follow up with PCP in 2 weeks post discharge 2. Please obtain BMP to evaluate electrolytes and kidney function 3. Please also check CBC to evaluate Hg and Hct levels   Discharge Diagnoses:  Principal Problem:   Thrombocytopenia, unspecified Active Problems:   Anemia   IVDU (intravenous drug user)   Tobacco abuse Thrombocytopenia  -Suspect medication induced  -Platelets continued to improve without any further intervention -Platelets 50K on the day of discharge; platelets 16,000 on the day of admission -few schistocytes noted on peripheral smear--likely due to her acute medical illness -Appreciate Dr. Elta Guadeloupe further interventions at this time as the platelets are improving  -labs do not suggest DIC--d-dimer negative, LDH 642, fibrinogen 360, INR 1.06 -Continues to improve without any further interventions -Viral hepatitis serologies, HIV serology negative  -DAT negative -I discussed with the patient on avoiding any NSAIDs at this time -I told the patient to come to the ED or contact her primary care provider if she notices any uncontrolled bleeding including hematochezia, hematuria, hemoptysis, epistaxis Dyspnea on exertion/chest discomfort  -Echocardiogram--EF 60-65%, no vegetation, no WMA  -Cycle troponins--negative -EKG negative for ST-T wave changes  -D-Dimer neg  -Wells' score 0  Opioid abuse/IVDU  -continue suboxone  Tobacco abuse  -Tobacco cessation discussed Normocytic anemia -May be medication induced versus anemia of chronic disease -Iron saturation 29% -B12--289 -RBC folate pending -Followup with hematology in one month  Discharge Condition: Stable  Disposition:  Follow-up Information   Follow up with [facility], MD In 1  month.   Specialty:  Oncology   Contact information:   [address] [place] [address] [phone]      discharge home  Diet: Regular Wt Readings from Last 3 Encounters:  03/05/13 56.654 kg (124 lb 14.4 oz)  11/14/12 54.432 kg (120 lb)  11/08/12 54.432 kg (120 lb)    History of present illness:  27 y.o. female who presents to the ED at [facility] with c/o feeling dizzy and lightheaded for past 3 days. Also has had DOE and SOB. This occurs in the context of a h/o IVDU and recently being started on suboxone just last week. She was doing well without withdrawal symptoms although she was noted in the ED to have fresh track marks on the dorsum of both hands. She had a fever of 99.9 last week.  Work up in the ED at [facility] was remarkable for new thrombocytopenia with platelet count of 16 down from over 284 in 11/14/2012, she is also anemic at 8.9, her only source of bleeding recently was nose bleeds. Patient stated that she was injecting crushed and liquefied Opana and oxycodone up until last week. The patient complained of dyspnea on exertion. Echocardiogram was obtained and showed EF 60-65% with no wall motion abnormalities. Blood cultures were negative x2 sets. Chest x-ray was negative for any infiltrates. Troponins were negative. Hematology was consulted. LDH was ordered and was 642. D-dimer was negative. Fibrinogen was 360. INR was 1.06. The patient's platelets gradually improved without any further interventions. Her mental status remained clear. There was no renal insufficiency. Blood cultures remained negative.     Consultants: Hematology, Dr. Clelia Croft  Discharge Exam: Filed Vitals:   03/08/13 0426  BP: 99/65  Pulse: 72  Temp: 98.1 F (36.7 C)  Resp: 18  Filed Vitals:   03/07/13 0600 03/07/13 1259 03/07/13 2237 03/08/13 0426  BP: 92/58 113/75 94/60 99/65   Pulse: 69 86 78 72  Temp: 98.1 F (36.7 C) 97.8 F (36.6 C) 98.1 F (36.7 C) 98.1 F (36.7 C)  TempSrc: Oral Oral Oral Oral   Resp: 18 20 18 18   Height:      Weight:      SpO2: 100% 100% 100% 100%   General: A&O x 3, NAD, pleasant, cooperative Cardiovascular: RRR, no rub, no gallop, no S3 Respiratory: CTAB, no wheeze, no rhonchi Abdomen:soft, nontender, nondistended, positive bowel sounds Extremities: No edema, No lymphangitis, no petechiae  Discharge Instructions      Discharge Orders   Future Appointments Provider Department Dept Phone   03/23/2013 8:30 AM Lazaro Arms, MD [facility] [phone]   Future Orders Complete By Expires   Diet general  As directed    Increase activity slowly  As directed        Medication List    STOP taking these medications       BC HEADACHE 325-95-16 MG Tabs  Generic drug:  Aspirin-Salicylamide-Caffeine     cephALEXin 500 MG capsule  Commonly known as:  KEFLEX     ibuprofen 200 MG tablet  Commonly known as:  ADVIL,MOTRIN      TAKE these medications       amitriptyline 50 MG tablet  Commonly known as:  ELAVIL  Take 50 mg by mouth at bedtime.     IMPLANON 68 MG Impl implant  Generic drug:  etonogestrel  Inject 1 each into the skin once.     SUBOXONE 2-0.5 MG Subl SL tablet  Generic drug:  buprenorphine-naloxone  Place 2 tablets under the tongue every morning.         The results of significant diagnostics from this hospitalization (including imaging, microbiology, ancillary and laboratory) are listed below for reference.    Significant Diagnostic Studies: Dg Chest 1 View  03/06/2013   CLINICAL DATA:  Shortness of Breath.  EXAM: CHEST - 1 VIEW  COMPARISON:  10/31/2012  FINDINGS: The heart size and mediastinal contours are within normal limits. Both lungs are clear. The visualized skeletal structures are unremarkable. Abdomen was shielded. No effusion.  IMPRESSION: No active disease.   Electronically Signed   By: Oley Balm M.D.   On: 03/06/2013 01:09     Microbiology: Recent Results (from the past 240 hour(s))  URINE CULTURE      Status: None   Collection Time    03/05/13  5:05 PM      Result Value Range Status   Specimen Description URINE, CLEAN CATCH   Final   Special Requests NONE   Final   Culture  Setup Time     Final   Value: 03/06/2013 01:52     Performed at [facility] Count     Final   Value: NO GROWTH     Performed at [facility]   Culture     Final   Value: NO GROWTH     Performed at [facility]   Report Status 03/06/2013 FINAL   Final  CULTURE, BLOOD (ROUTINE X 2)     Status: None   Collection Time    03/05/13 11:16 PM      Result Value Range Status   Specimen Description BLOOD LEFT ANTECUBITAL   Final   Special Requests BOTTLES DRAWN AEROBIC AND ANAEROBIC 5CC   Final  Culture  Setup Time     Final   Value: 03/06/2013 04:47     Performed at [facility]   Culture     Final   Value:        BLOOD CULTURE RECEIVED NO GROWTH TO DATE CULTURE WILL BE HELD FOR 5 DAYS BEFORE ISSUING A FINAL NEGATIVE REPORT     Performed at [facility]   Report Status PENDING   Incomplete  CULTURE, BLOOD (ROUTINE X 2)     Status: None   Collection Time    03/05/13 11:20 PM      Result Value Range Status   Specimen Description BLOOD LEFT ARM   Final   Special Requests BOTTLES DRAWN AEROBIC AND ANAEROBIC 2CC   Final   Culture  Setup Time     Final   Value: 03/06/2013 04:47     Performed at [facility]   Culture     Final   Value:        BLOOD CULTURE RECEIVED NO GROWTH TO DATE CULTURE WILL BE HELD FOR 5 DAYS BEFORE ISSUING A FINAL NEGATIVE REPORT     Performed at [facility]   Report Status PENDING   Incomplete     Labs: Basic Metabolic Panel:  Recent Labs Lab 03/05/13 1657 03/07/13 0550  NA 134* 135  K 3.8 4.5  CL 101 106  CO2 22 21  GLUCOSE 103* 100*  BUN 23 30*  CREATININE 0.99 1.08  CALCIUM 9.0 9.6   Liver Function Tests:  Recent Labs Lab 03/05/13 1657 03/07/13 0550  AST 29 25  ALT 11 11  ALKPHOS 69 73  BILITOT  1.4* 1.1  PROT 7.6 7.9  ALBUMIN 3.8 4.0   No results found for this basename: LIPASE, AMYLASE,  in the last 168 hours No results found for this basename: AMMONIA,  in the last 168 hours CBC:  Recent Labs Lab 03/05/13 1657 03/06/13 0534 03/06/13 1507 03/07/13 0550 03/08/13 0315  WBC 9.3 7.3  --  8.0 5.3  NEUTROABS 6.4  --   --   --  2.5  HGB 8.4* 8.5*  --  8.9* 7.9*  HCT 24.4* 23.5*  --  24.8* 22.5*  MCV 87.1 86.7  --  88.9 89.6  PLT 16* 19* 26* 42* 50*   Cardiac Enzymes:  Recent Labs Lab 03/07/13 1556 03/07/13 2123 03/08/13 0315  CKTOTAL 53  --   --   TROPONINI <0.30 <0.30 <0.30   BNP: No components found with this basename: POCBNP,  CBG: No results found for this basename: GLUCAP,  in the last 168 hours  Time coordinating discharge:  Greater than 30 minutes  Signed:  Claudis Giovanelli, DO [facility] Pager: [phone] 03/08/2013, 1:09 PM

## 2013-03-08 LAB — CBC WITH DIFFERENTIAL/PLATELET
Eosinophils Absolute: 0.2 10*3/uL (ref 0.0–0.7)
Eosinophils Relative: 3 % (ref 0–5)
Lymphs Abs: 2.1 10*3/uL (ref 0.7–4.0)
MCH: 31.5 pg (ref 26.0–34.0)
MCHC: 35.1 g/dL (ref 30.0–36.0)
MCV: 89.6 fL (ref 78.0–100.0)
Monocytes Absolute: 0.5 10*3/uL (ref 0.1–1.0)
Monocytes Relative: 10 % (ref 3–12)
Neutrophils Relative %: 47 % (ref 43–77)
Platelets: 50 10*3/uL — ABNORMAL LOW (ref 150–400)
RBC: 2.51 MIL/uL — ABNORMAL LOW (ref 3.87–5.11)

## 2013-03-08 LAB — VITAMIN B12: Vitamin B-12: 289 pg/mL (ref 211–911)

## 2013-03-08 LAB — IRON AND TIBC
Saturation Ratios: 29 % (ref 20–55)
UIBC: 182 ug/dL (ref 125–400)

## 2013-03-08 LAB — TROPONIN I: Troponin I: <0.3 ng/mL (ref <0.30)

## 2013-03-08 LAB — FOLATE RBC: RBC Folate: 1792 ng/mL — ABNORMAL HIGH (ref >=366)

## 2013-03-12 LAB — CULTURE, BLOOD (ROUTINE X 2)
Culture: NO GROWTH
Culture: NO GROWTH

## 2013-03-23 ENCOUNTER — Encounter: Payer: Self-pay | Admitting: *Deleted

## 2013-03-23 ENCOUNTER — Other Ambulatory Visit: Payer: Self-pay | Admitting: Obstetrics & Gynecology

## 2013-04-09 ENCOUNTER — Telehealth: Payer: Self-pay | Admitting: Oncology

## 2013-04-09 NOTE — Telephone Encounter (Signed)
PT CALLED TO SCHEDULED HOSP F/U 12/09 @ 2 W/DR. [facility]AD[facility] WELCOME CALENDAR MAILED.

## 2013-04-09 NOTE — Telephone Encounter (Signed)
C/D 04/09/13 for appt. 04/17/13

## 2013-04-13 ENCOUNTER — Other Ambulatory Visit: Payer: Self-pay | Admitting: Oncology

## 2013-04-13 DIAGNOSIS — D696 Thrombocytopenia, unspecified: Secondary | ICD-10-CM

## 2013-04-17 ENCOUNTER — Other Ambulatory Visit: Payer: Medicaid Other | Admitting: Lab

## 2013-04-17 ENCOUNTER — Ambulatory Visit: Payer: Medicaid Other

## 2013-04-17 ENCOUNTER — Inpatient Hospital Stay: Payer: Medicaid Other | Admitting: Oncology

## 2013-05-07 ENCOUNTER — Encounter: Payer: Self-pay | Admitting: *Deleted

## 2013-05-07 ENCOUNTER — Other Ambulatory Visit: Payer: Self-pay | Admitting: Obstetrics and Gynecology

## 2013-06-27 ENCOUNTER — Other Ambulatory Visit: Payer: Self-pay | Admitting: Women's Health

## 2013-06-27 ENCOUNTER — Encounter: Payer: Self-pay | Admitting: *Deleted

## 2013-07-04 ENCOUNTER — Encounter: Payer: Self-pay | Admitting: *Deleted

## 2013-07-04 ENCOUNTER — Other Ambulatory Visit: Payer: Self-pay | Admitting: Women's Health

## 2013-08-08 ENCOUNTER — Other Ambulatory Visit: Payer: Self-pay | Admitting: Women's Health

## 2013-08-08 ENCOUNTER — Encounter: Payer: Self-pay | Admitting: *Deleted

## 2013-09-03 ENCOUNTER — Encounter: Payer: Self-pay | Admitting: *Deleted

## 2013-09-03 ENCOUNTER — Other Ambulatory Visit: Payer: Self-pay | Admitting: Women's Health

## 2013-09-27 ENCOUNTER — Other Ambulatory Visit: Payer: Self-pay | Admitting: Obstetrics & Gynecology

## 2013-09-27 ENCOUNTER — Encounter: Payer: Self-pay | Admitting: *Deleted

## 2013-10-14 ENCOUNTER — Emergency Department (HOSPITAL_COMMUNITY)
Admission: EM | Admit: 2013-10-14 | Discharge: 2013-10-14 | Disposition: A | Discharge status: Home / Self Care | Payer: Medicaid Other | Attending: Emergency Medicine | Admitting: Emergency Medicine

## 2013-10-14 DIAGNOSIS — F329 Major depressive disorder, single episode, unspecified: Secondary | ICD-10-CM | POA: Insufficient documentation

## 2013-10-14 DIAGNOSIS — F3289 Other specified depressive episodes: Secondary | ICD-10-CM | POA: Insufficient documentation

## 2013-10-14 DIAGNOSIS — Z8739 Personal history of other diseases of the musculoskeletal system and connective tissue: Secondary | ICD-10-CM | POA: Insufficient documentation

## 2013-10-14 DIAGNOSIS — S61519A Laceration without foreign body of unspecified wrist, initial encounter: Secondary | ICD-10-CM

## 2013-10-14 DIAGNOSIS — S61509A Unspecified open wound of unspecified wrist, initial encounter: Secondary | ICD-10-CM | POA: Insufficient documentation

## 2013-10-14 DIAGNOSIS — Y9389 Activity, other specified: Secondary | ICD-10-CM | POA: Insufficient documentation

## 2013-10-14 DIAGNOSIS — F17200 Nicotine dependence, unspecified, uncomplicated: Secondary | ICD-10-CM | POA: Insufficient documentation

## 2013-10-14 DIAGNOSIS — Z79899 Other long term (current) drug therapy: Secondary | ICD-10-CM | POA: Insufficient documentation

## 2013-10-14 DIAGNOSIS — Y929 Unspecified place or not applicable: Secondary | ICD-10-CM | POA: Insufficient documentation

## 2013-10-14 DIAGNOSIS — W292XXA Contact with other powered household machinery, initial encounter: Secondary | ICD-10-CM | POA: Insufficient documentation

## 2013-10-14 DIAGNOSIS — G8929 Other chronic pain: Secondary | ICD-10-CM | POA: Insufficient documentation

## 2013-10-14 DIAGNOSIS — F411 Generalized anxiety disorder: Secondary | ICD-10-CM | POA: Insufficient documentation

## 2013-10-14 MED ORDER — LIDOCAINE HCL (PF) 1 % IJ SOLN
5.0000 mL | Freq: Once | INTRAMUSCULAR | Status: AC
  Administered 2013-10-14: 5 mL via INTRADERMAL
  Filled 2013-10-14: qty 5

## 2013-10-14 MED ORDER — CEPHALEXIN 500 MG PO CAPS: 500.0000 mg | ORAL_CAPSULE | Freq: Four times a day (QID) | ORAL | Status: DC

## 2013-10-14 NOTE — ED Notes (Signed)
Laceration to left wrist, cut opening a package.0.5 cm with bleeding controlled, able to move fingers without difficulty

## 2013-10-14 NOTE — ED Provider Notes (Signed)
CSN: [id number]     Arrival date & time 10/14/13  1738 History  This chart was scribed for non-physician practitioner Kem Parkinson, PA-C working with Fredia Sorrow, MD by Rolanda Lundborg, ED Scribe. This patient was seen in room APFT20/APFT20 and the patient's care was started at 6:07 PM.    Chief Complaint  Patient presents with  . Extremity Laceration   Patient is a 28 y.o. female presenting with skin laceration. The history is provided by the patient. No language interpreter was used.  Laceration Location:  Hand Hand laceration location:  Hughes wrist Quality: straight   Bleeding: controlled   Time since incident:  2 hours Laceration mechanism:  Knife Pain details:    Quality:  Aching   Severity:  Mild   Timing:  Constant   Progression:  Unchanged Foreign body present:  No foreign bodies Relieved by:  Pressure Worsened by:  Movement Ineffective treatments:  None tried Tetanus status:  Up to date  HPI Comments: Brianna Hughes is a 28 y.o. female who presents to the Emergency Department complaining of a laceration to the left wrist sustained from the knife slipping while opening a package. She states it was bleeding heavily, but not squirting.  Bleeding is controlled at this time. She rinsed the area with water PTA. Her last TDAP was one year ago.  She denies numbness or weakness of the hand, wrist or fingers   Past Medical History  Diagnosis Date  . Scoliosis   . Headache(784.0)   . Anxiety   . Depression   . Chronic back pain   . Chronic headaches   . Chronic ankle pain   . Chronic knee pain    Past Surgical History  Procedure Laterality Date  . Mouth surgery     Family History  Problem Relation Age of Onset  . Anesthesia problems Neg Hx   . Hypotension Neg Hx   . Malignant hyperthermia Neg Hx   . Pseudochol deficiency Neg Hx    History  Substance Use Topics  . Smoking status: Current Every Day Smoker -- 0.25 packs/day for 10 years    Types: Cigarettes  .  Smokeless tobacco: Never Used  . Alcohol Use: No   OB History   Grav Para Term Preterm Abortions TAB SAB Ect Mult Living   2 2 2  0 0 0 0 0 0 2     Review of Systems  Constitutional: Negative for fever, chills and fatigue.  HENT: Negative for sore throat and trouble swallowing.   Respiratory: Negative for cough, shortness of breath and wheezing.   Cardiovascular: Negative for chest pain and palpitations.  Gastrointestinal: Negative for nausea, vomiting, abdominal pain and blood in stool.  Genitourinary: Negative for dysuria, hematuria and flank pain.  Musculoskeletal: Negative for arthralgias, back pain, joint swelling, myalgias, neck pain and neck stiffness.  Skin: Positive for wound. Negative for rash.       Laceration   Neurological: Negative for dizziness, weakness and numbness.  Hematological: Does not bruise/bleed easily.  All other systems reviewed and are negative.     Allergies  Hydrocodone  Home Medications   Prior to Admission medications   Medication Sig Start Date End Date Taking? Authorizing Provider  amitriptyline (ELAVIL) 50 MG tablet Take 50 mg by mouth at bedtime.    Historical Provider, MD  buprenorphine-naloxone (SUBOXONE) 2-0.5 MG SUBL SL tablet Place 2 tablets under the tongue every morning.    Historical Provider, MD  etonogestrel (IMPLANON) 68 MG IMPL implant  Inject 1 each into the skin once.    Historical Provider, MD   BP 123/87  Pulse 97  Temp(Src) 98.9 F (37.2 C) (Oral)  Resp 20  Ht 5\' 5"  (1.651 m)  Wt 140 lb (63.504 kg)  BMI 23.30 kg/m2  SpO2 98% Physical Exam  Nursing note and vitals reviewed. Constitutional: She is oriented to person, place, and time. She appears well-developed and well-nourished. No distress.  HENT:  Head: Normocephalic and atraumatic.  Cardiovascular: Normal rate, regular rhythm and normal heart sounds.   No murmur heard. Pulmonary/Chest: Effort normal and breath sounds normal. No respiratory distress.   Musculoskeletal: Normal range of motion. She exhibits no tenderness.  Normal finger thumb opposition. Full rom of the left wrist. Radial pulse intact.  CR< 2 sec. Distal sensation intact  Neurological: She is alert and oriented to person, place, and time. She exhibits normal muscle tone. Coordination normal.  Skin: Skin is warm and dry.  2 cm laceration to the palmar surface of the distal left wrist. Bleeding controlled. Slight soft tissue swelling present.     ED Course  Procedures (including critical care time) Medications - No data to display  DIAGNOSTIC STUDIES: Oxygen Saturation is 98% on RA, normal by my interpretation.    COORDINATION OF CARE: 6:13 PM- Discussed treatment plan with pt which includes lac repair. Pt agrees to plan.   LACERATION REPAIR Performed by: Kem Parkinson, PA-C  Consent: Verbal consent obtained. Risks and benefits: risks, benefits and alternatives were discussed Patient identity confirmed: provided demographic data Time out performed prior to procedure Prepped and Draped in normal sterile fashion Wound explored Laceration Location: left wrist Laceration Length: 2cm No Foreign Bodies seen or palpated Anesthesia: local infiltration Local anesthetic: lidocaine 1 % w/o epinephrine Anesthetic total: 1 ml Irrigation method: syringe Amount of cleaning: standard Skin closure:4-0 prolene Number of sutures 2 Technique: simple interrupted Patient tolerance: Patient tolerated the procedure well with no immediate complications.    Labs Review Labs Reviewed - No data to display  Imaging Review No results found.   EKG Interpretation None      MDM   Final diagnoses:  Laceration of wrist without complication    Pt is well appearing, remains NV intact.  No concerning sx's for injury to the deep structures of the wrist.  Bleeding was controlled prior to wound closure.  Pt advised to elevate, sutures out in 10 days and to return here if any  worsening symptoms.  She agrees to plan and appears stable for d/c  I personally performed the services described in this documentation, which was scribed in my presence. The recorded information has been reviewed and is accurate.    Brianna Hughes. Brianna Mexicano, PA-C 10/16/13 1253

## 2013-10-14 NOTE — Discharge Instructions (Signed)

## 2013-10-24 NOTE — ED Provider Notes (Signed)
Medical screening examination/treatment/procedure(s) were performed by non-physician practitioner and as supervising physician I was immediately available for consultation/collaboration.   EKG Interpretation None        Fredia Sorrow, MD 10/24/13 1544

## 2014-03-11 ENCOUNTER — Encounter (HOSPITAL_COMMUNITY): Payer: Self-pay | Admitting: Emergency Medicine

## 2014-05-15 ENCOUNTER — Telehealth (HOSPITAL_COMMUNITY): Payer: Self-pay

## 2014-05-15 NOTE — Telephone Encounter (Signed)
Tried to call patient back on 12/3 but mailbox was full and had no other phone numbers to contact patient

## 2014-07-11 IMAGING — CR DG CHEST 1V
1 series · 1 of 1 positions shown · non-contrast
Comparison: 10/31/2012

CLINICAL DATA: Shortness of Breath.

EXAM:
CHEST - 1 VIEW

[AP]
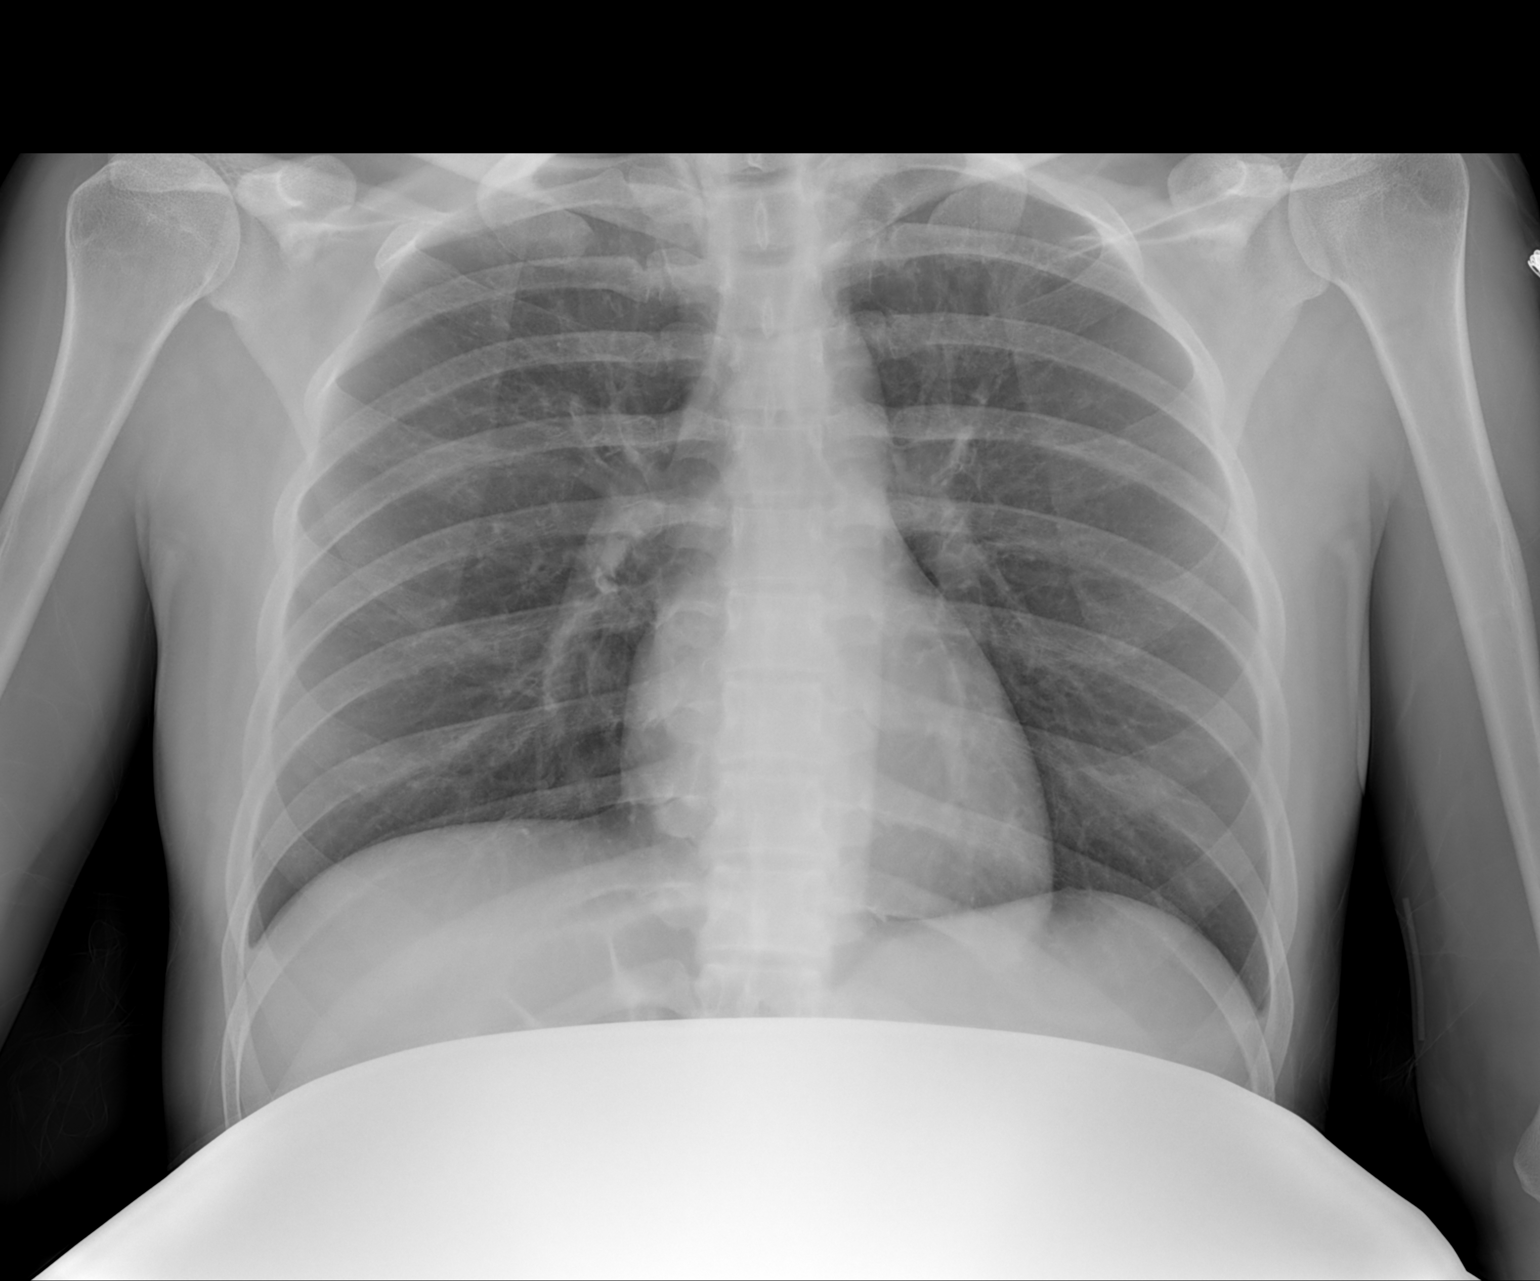

[1 of 1 positions shown; findings below may reference images not displayed]

FINDINGS: The heart size and mediastinal contours are within normal limits.
Both lungs are clear. The visualized skeletal structures are
unremarkable. Abdomen was shielded. No effusion.
IMPRESSION: No active disease.

## 2014-12-17 ENCOUNTER — Other Ambulatory Visit: Payer: Self-pay | Admitting: Women's Health

## 2014-12-17 ENCOUNTER — Encounter: Payer: Self-pay | Admitting: *Deleted

## 2015-03-02 ENCOUNTER — Encounter (HOSPITAL_COMMUNITY): Payer: Self-pay | Admitting: Emergency Medicine

## 2015-03-02 ENCOUNTER — Emergency Department (HOSPITAL_COMMUNITY)
Admission: EM | Admit: 2015-03-02 | Discharge: 2015-03-02 | Disposition: A | Discharge status: Home / Self Care | Payer: Medicaid Other | Attending: Emergency Medicine | Admitting: Emergency Medicine

## 2015-03-02 DIAGNOSIS — R51 Headache: Secondary | ICD-10-CM | POA: Insufficient documentation

## 2015-03-02 DIAGNOSIS — M79604 Pain in right leg: Secondary | ICD-10-CM | POA: Insufficient documentation

## 2015-03-02 DIAGNOSIS — K0889 Other specified disorders of teeth and supporting structures: Secondary | ICD-10-CM | POA: Insufficient documentation

## 2015-03-02 DIAGNOSIS — R42 Dizziness and giddiness: Secondary | ICD-10-CM | POA: Insufficient documentation

## 2015-03-02 DIAGNOSIS — Z8659 Personal history of other mental and behavioral disorders: Secondary | ICD-10-CM | POA: Insufficient documentation

## 2015-03-02 DIAGNOSIS — Z720 Tobacco use: Secondary | ICD-10-CM | POA: Insufficient documentation

## 2015-03-02 DIAGNOSIS — R110 Nausea: Secondary | ICD-10-CM | POA: Insufficient documentation

## 2015-03-02 DIAGNOSIS — Z862 Personal history of diseases of the blood and blood-forming organs and certain disorders involving the immune mechanism: Secondary | ICD-10-CM | POA: Insufficient documentation

## 2015-03-02 DIAGNOSIS — Z8739 Personal history of other diseases of the musculoskeletal system and connective tissue: Secondary | ICD-10-CM | POA: Insufficient documentation

## 2015-03-02 HISTORY — DX: Anemia, unspecified: D64.9

## 2015-03-02 LAB — BASIC METABOLIC PANEL
Anion gap: 7 (ref 5–15)
BUN: 11 mg/dL (ref 6–20)
CHLORIDE: 104 mmol/L (ref 101–111)
CO2: 26 mmol/L (ref 22–32)
Calcium: 9.3 mg/dL (ref 8.9–10.3)
Creatinine, Ser: 0.78 mg/dL (ref 0.44–1.00)
GFR calc Af Amer: >60 mL/min (ref >60)
GFR calc non Af Amer: >60 mL/min (ref >60)
Glucose, Bld: 107 mg/dL — ABNORMAL HIGH (ref 65–99)
POTASSIUM: 4.6 mmol/L (ref 3.5–5.1)
SODIUM: 137 mmol/L (ref 135–145)

## 2015-03-02 LAB — CBC WITH DIFFERENTIAL/PLATELET
Basophils Absolute: 0 10*3/uL (ref 0.0–0.1)
Basophils Relative: 0 %
EOS ABS: 0 10*3/uL (ref 0.0–0.7)
EOS PCT: 1 %
HCT: 41.7 % (ref 36.0–46.0)
Hemoglobin: 14.8 g/dL (ref 12.0–15.0)
LYMPHS ABS: 1.5 10*3/uL (ref 0.7–4.0)
Lymphocytes Relative: 26 %
MCH: 31.6 pg (ref 26.0–34.0)
MCHC: 35.5 g/dL (ref 30.0–36.0)
MCV: 89.1 fL (ref 78.0–100.0)
Monocytes Absolute: 0.4 10*3/uL (ref 0.1–1.0)
Monocytes Relative: 7 %
Neutro Abs: 3.7 10*3/uL (ref 1.7–7.7)
Neutrophils Relative %: 66 %
Platelets: 275 10*3/uL (ref 150–400)
RBC: 4.68 MIL/uL (ref 3.87–5.11)
RDW: 12.2 % (ref 11.5–15.5)
WBC: 5.6 10*3/uL (ref 4.0–10.5)

## 2015-03-02 LAB — URINALYSIS, ROUTINE W REFLEX MICROSCOPIC
BILIRUBIN URINE: NEGATIVE
GLUCOSE, UA: NEGATIVE mg/dL
HGB URINE DIPSTICK: NEGATIVE
Ketones, ur: NEGATIVE mg/dL
Leukocytes, UA: NEGATIVE
Nitrite: POSITIVE — AB
PH: 8 (ref 5.0–8.0)
Protein, ur: NEGATIVE mg/dL
SPECIFIC GRAVITY, URINE: 1.01 (ref 1.005–1.030)
Urobilinogen, UA: 0.2 mg/dL (ref 0.0–1.0)

## 2015-03-02 LAB — URINE MICROSCOPIC-ADD ON

## 2015-03-02 MED ORDER — MECLIZINE HCL 25 MG PO TABS: 25.0000 mg | ORAL_TABLET | Freq: Three times a day (TID) | ORAL | Status: DC | PRN

## 2015-03-02 NOTE — ED Notes (Signed)
Patient complaining of dizziness and vomiting "for 3-4 months" worsening the last two weeks.

## 2015-03-02 NOTE — ED Provider Notes (Signed)
CSN: [id number]     Arrival date & time 03/02/15  1301 History   First MD Initiated Contact with Patient 03/02/15 1530     Chief Complaint  Patient presents with  . Dizziness  . Emesis     (Consider location/radiation/quality/duration/timing/severity/associated sxs/prior Treatment) HPI Complains of dizziness meaning feeling of room spinning accompanied by nausea onset 2 years ago, intermittently starting up again approximately 2 weeks ago. Symptoms worse with changing position or moving her head improved with remain still last approximate 5 minutes at a time. No treatment prior to coming here no visual changes no focal numbness or weakness. Past Medical History  Diagnosis Date  . Scoliosis   . Headache(784.0)   . Anxiety   . Depression   . Chronic back pain   . Chronic headaches   . Chronic ankle pain   . Chronic knee pain   . Anemia    Past Surgical History  Procedure Laterality Date  . Mouth surgery     Family History  Problem Relation Age of Onset  . Anesthesia problems Neg Hx   . Hypotension Neg Hx   . Malignant hyperthermia Neg Hx   . Pseudochol deficiency Neg Hx    Social History  Substance Use Topics  . Smoking status: Current Every Day Smoker -- 0.25 packs/day for 10 years    Types: Cigarettes  . Smokeless tobacco: Never Used  . Alcohol Use: No   OB History    Gravida Para Term Preterm AB TAB SAB Ectopic Multiple Living   2 2 2  0 0 0 0 0 0 2     Review of Systems  Constitutional: Negative.   HENT: Positive for dental problem.        Dental pain for the past 3 or 4 months  Respiratory: Negative.   Cardiovascular: Negative.   Gastrointestinal: Positive for nausea.  Genitourinary:       Irregular menses  Musculoskeletal: Negative.        Painful area on right leg for 2 years  Skin: Negative.   Neurological: Positive for dizziness and headaches.       Occasional headache, none now  Psychiatric/Behavioral: Negative.   All other systems reviewed and  are negative.     Allergies  Hydrocodone  Home Medications   Prior to Admission medications   Medication Sig Start Date End Date Taking? Authorizing Provider  etonogestrel (IMPLANON) 68 MG IMPL implant 1 each by Subdermal route once.   Yes Historical Provider, MD  cephALEXin (KEFLEX) 500 MG capsule Take 1 capsule (500 mg total) by mouth 4 (four) times daily. For 7 days 10/14/13   Tammy Triplett, PA-C  etonogestrel (IMPLANON) 68 MG IMPL implant Inject 1 each into the skin once.    Historical Provider, MD   BP 158/96 mmHg  Pulse 68  Temp(Src) 98.2 F (36.8 C) (Oral)  Resp 14  Ht 5\' 5"  (1.651 m)  Wt 134 lb (60.782 kg)  BMI 22.30 kg/m2  SpO2 100%  LMP 03/01/2015 Physical Exam  Constitutional: She is oriented to person, place, and time. She appears well-developed and well-nourished. No distress.  HENT:  Head: Normocephalic and atraumatic.  Right Ear: External ear normal.  Left Ear: External ear normal.  Bilateral tympanic membranes normal. No obvious dental caries no fluctuance of gingiva no trismus  Eyes: Conjunctivae are normal. Pupils are equal, round, and reactive to light.  Neck: Neck supple. No tracheal deviation present. No thyromegaly present.  Cardiovascular: Normal rate and regular rhythm.  No murmur heard. Pulmonary/Chest: Effort normal and breath sounds normal.  Abdominal: Soft. Bowel sounds are normal. She exhibits no distension. There is no tenderness.  Musculoskeletal: Normal range of motion. She exhibits no edema or tenderness.  Right lower extremity there is a pea-sized tender area at lateral leg approximately 5 cm proximal to lateral malleolus. No redness no deformity DP pulse 2+  Neurological: She is alert and oriented to person, place, and time. No cranial nerve deficit. Coordination normal.  Gait normal Romberg normal pronator drift normal finger to nose normal  Skin: Skin is warm and dry. No rash noted.  Psychiatric: She has a normal mood and affect.   Nursing note and vitals reviewed.  Results for orders placed or performed during the hospital encounter of 03/02/15  CBC with Differential  Result Value Ref Range   WBC 5.6 4.0 - 10.5 K/uL   RBC 4.68 3.87 - 5.11 MIL/uL   Hemoglobin 14.8 12.0 - 15.0 g/dL   HCT 41.7 36.0 - 46.0 %   MCV 89.1 78.0 - 100.0 fL   MCH 31.6 26.0 - 34.0 pg   MCHC 35.5 30.0 - 36.0 g/dL   RDW 12.2 11.5 - 15.5 %   Platelets 275 150 - 400 K/uL   Neutrophils Relative % 66 %   Neutro Abs 3.7 1.7 - 7.7 K/uL   Lymphocytes Relative 26 %   Lymphs Abs 1.5 0.7 - 4.0 K/uL   Monocytes Relative 7 %   Monocytes Absolute 0.4 0.1 - 1.0 K/uL   Eosinophils Relative 1 %   Eosinophils Absolute 0.0 0.0 - 0.7 K/uL   Basophils Relative 0 %   Basophils Absolute 0.0 0.0 - 0.1 K/uL  Basic metabolic panel  Result Value Ref Range   Sodium 137 135 - 145 mmol/L   Potassium 4.6 3.5 - 5.1 mmol/L   Chloride 104 101 - 111 mmol/L   CO2 26 22 - 32 mmol/L   Glucose, Bld 107 (H) 65 - 99 mg/dL   BUN 11 6 - 20 mg/dL   Creatinine, Ser 0.78 0.44 - 1.00 mg/dL   Calcium 9.3 8.9 - 10.3 mg/dL   GFR calc non Af Amer >60 >60 mL/min   GFR calc Af Amer >60 >60 mL/min   Anion gap 7 5 - 15   No results found.   ED Course  Procedures (including critical care time) Labs Review Labs Reviewed  BASIC METABOLIC PANEL - Abnormal; Notable for the following:    Glucose, Bld 107 (*)    All other components within normal limits  CBC WITH DIFFERENTIAL/PLATELET  URINALYSIS, ROUTINE W REFLEX MICROSCOPIC (NOT AT [facility])    Imaging Review No results found. I have personally reviewed and evaluated these images and lab results as part of my medical decision-making.   EKG Interpretation None      MDM  Patient exhibiting symptoms of benign positional vertigo. Dental pain is chronic. She is been told that she has to have her "wisdom teeth" out and been evaluated for dental pain earlier this year. She's also been followed by Dr.Keeling for nodule on right  leg and she reports that he told to follow-up at his office if it continues to bother her .plan prescription for vertigo. Patient's counseled patient for 5 minutes on smoking cessation. Referred back to her primary care physician Dr. Gerarda Fraction and to resource guide Final diagnoses:  None   diagnosis #1 vertigo #2 chronic dental pain #3 chronic right leg pain  #4 tobacco abuse  Orlie Dakin, MD 03/02/15 1626

## 2015-03-02 NOTE — Discharge Instructions (Signed)
Benign Positional Vertigo Take the medication prescribed as needed for dizziness. Follow-up with Dr. Gerarda Fraction or any of the numbers on the resource guide to get a new primary care physician. Ask your primary care physician to help you to stop smoking. Call Dr.Keeling if he was to be further evaluated for the painful area on your right leg. Your blood pressure today was mildly elevated at 138/92. Ask your primary care physician to recheck your blood pressure in 3 weeks  Vertigo is the feeling that you or your surroundings are moving when they are not. Benign positional vertigo is the most common form of vertigo. The cause of this condition is not serious (is benign). This condition is triggered by certain movements and positions (is positional). This condition can be dangerous if it occurs while you are doing something that could endanger you or others, such as driving.  CAUSES In many cases, the cause of this condition is not known. It may be caused by a disturbance in an area of the inner ear that helps your brain to sense movement and balance. This disturbance can be caused by a viral infection (labyrinthitis), head injury, or repetitive motion. RISK FACTORS This condition is more likely to develop in:  Women.  People who are 16 years of age or older. SYMPTOMS Symptoms of this condition usually happen when you move your head or your eyes in different directions. Symptoms may start suddenly, and they usually last for less than a minute. Symptoms may include:  Loss of balance and falling.  Feeling like you are spinning or moving.  Feeling like your surroundings are spinning or moving.  Nausea and vomiting.  Blurred vision.  Dizziness.  Involuntary eye movement (nystagmus). Symptoms can be mild and cause only slight annoyance, or they can be severe and interfere with daily life. Episodes of benign positional vertigo may return (recur) over time, and they may be triggered by certain movements.  Symptoms may improve over time. DIAGNOSIS This condition is usually diagnosed by medical history and a physical exam of the head, neck, and ears. You may be referred to a health care provider who specializes in ear, nose, and throat (ENT) problems (otolaryngologist) or a provider who specializes in disorders of the nervous system (neurologist). You may have additional testing, including:  MRI.  A CT scan.  Eye movement tests. Your health care provider may ask you to change positions quickly while he or she watches you for symptoms of benign positional vertigo, such as nystagmus. Eye movement may be tested with an electronystagmogram (ENG), caloric stimulation, the Dix-Hallpike test, or the roll test.  An electroencephalogram (EEG). This records electrical activity in your brain.  Hearing tests. TREATMENT Usually, your health care provider will treat this by moving your head in specific positions to adjust your inner ear back to normal. Surgery may be needed in severe cases, but this is rare. In some cases, benign positional vertigo may resolve on its own in 2-4 weeks. HOME CARE INSTRUCTIONS Safety  Move slowly.Avoid sudden body or head movements.  Avoid driving.  Avoid operating heavy machinery.  Avoid doing any tasks that would be dangerous to you or others if a vertigo episode would occur.  If you have trouble walking or keeping your balance, try using a cane for stability. If you feel dizzy or unstable, sit down right away.  Return to your normal activities as told by your health care provider. Ask your health care provider what activities are safe for you. General  Instructions  Take over-the-counter and prescription medicines only as told by your health care provider.  Avoid certain positions or movements as told by your health care provider.  Drink enough fluid to keep your urine clear or pale yellow.  Keep all follow-up visits as told by your health care provider. This is  important. SEEK MEDICAL CARE IF:  You have a fever.  Your condition gets worse or you develop new symptoms.  Your family or friends notice any behavioral changes.  Your nausea or vomiting gets worse.  You have numbness or a "pins and needles" sensation. SEEK IMMEDIATE MEDICAL CARE IF:  You have difficulty speaking or moving.  You are always dizzy.  You faint.  You develop severe headaches.  You have weakness in your legs or arms.  You have changes in your hearing or vision.  You develop a stiff neck.  You develop sensitivity to light.   This information is not intended to replace advice given to you by your health care provider. Make sure you discuss any questions you have with your health care provider.   Document Released: 02/01/2006 Document Revised: 01/15/2015 Document Reviewed: 08/19/2014 Elsevier Interactive Patient Education 2016 [employer].  Emergency Department Resource Guide 1) Find a Doctor and Pay Out of Pocket Although you won't have to find out who is covered by your insurance plan, it is a good idea to ask around and get recommendations. You will then need to call the office and see if the doctor you have chosen will accept you as a new patient and what types of options they offer for patients who are self-pay. Some doctors offer discounts or will set up payment plans for their patients who do not have insurance, but you will need to ask so you aren't surprised when you get to your appointment.  2) Contact Your Local Health Department Not all health departments have doctors that can see patients for sick visits, but many do, so it is worth a call to see if yours does. If you don't know where your local health department is, you can check in your phone book. The CDC also has a tool to help you locate your state's health department, and many state websites also have listings of all of their local health departments.  3) Find a [facility] If your illness  is not likely to be very severe or complicated, you may want to try a walk in clinic. These are popping up all over the country in pharmacies, drugstores, and shopping centers. They're usually staffed by nurse practitioners or physician assistants that have been trained to treat common illnesses and complaints. They're usually fairly quick and inexpensive. However, if you have serious medical issues or chronic medical problems, these are probably not your best option.  No Primary Care Doctor: - Call Health Connect at  [phone] - they can help you locate a primary care doctor that  accepts your insurance, provides certain services, etc. - Physician Referral Service- [phone]  Chronic Pain Problems: Organization         Address  Phone   Notes  [facility]  [phone] Patients need to be referred by their primary care doctor.   Medication Assistance: Organization         Address  Phone   Notes  [facility] Medication [facility] [address] [place], [place] [address] [phone] --Must be a resident of [facility] -- Must have NO insurance coverage  whatsoever (no Medicaid/ Medicare, etc.) -- The pt. MUST have a primary care doctor that directs their care regularly and follows them in the community   Med[facility]ist  [phone]   [employer]  [phone]    Agencies that provide inexpensive medical care: Organization         Address  Phone   Notes  [place]  [phone]   [facility]    [phone]   [facility] [place], [place] [address] [phone]   [place] [address] [place] [phone]   Planned Parenthood    [phone]   [facility]    [phone]   [place] [address], [place] Phone:  [phone], Fax:  [phone] Hours of Operation:  9 am - 6  pm, M-F.  Also accepts Medicaid/Medicare and self-pay.  [facility] for [place], [address], [place] Phone: [phone], Fax: [phone]. Hours of Operation:  8:30 am - 5:30 pm, M-F.  Also accepts Medicaid and self-pay.  [facility] [address], [place] Phone: [phone]   [place], [place], [place] [phone], Ext. [phone] Mondays & Thursdays: 7-9 AM.  First 15 patients are seen on a first come, first serve basis.    [place] Providers:  Organization         Address  Phone   Notes  [facility] [address], [place] [phone] Also accepts self-pay patients.  [facility] [address], [place]  [phone]   [place], [address], [place] [phone]   [facility] [address], [place] [phone]   Lucianne Lei [address], [place]   [phone] Only accepts [place] Access [place] patients after they have their name applied to their card.   Self-Pay (no insurance) in [facility]:  Organization         Address  Phone   Notes  Sickle Cell Patients, [facility] Internal Medicine [place] [phone]   [facility] [place] [phone]   [facility] [place]  [place], [place], [place] [phone]   [facility]/Dr. Osei-Bonsu  [address] [place] or [address], [place] [phone] Phone number for both [place] and [place] locations is the same.  [facility] [address] [place] [phone]   [facility] [address], [place] or [address] [phone]   [facility] [address] [place] [phone], phone; [phone], fax Sees patients 1st and 3rd Saturday of every month.  Must not qualify for public or private insurance (i.e. Medicaid, Medicare, [place] Health Choice, Veterans' Benefits)  Household income should be no more than 200% of the poverty level The clinic cannot treat you if you are pregnant or think you are pregnant  Sexually transmitted diseases are not treated at the clinic.    Dental Care: Organization         Address  Phone  Notes  [facility] Department of  [facility] [phone] Accepts children up to age 75 who are enrolled in [place] or [place]; pregnant women with a Medicaid card; and children who have applied for Medicaid or [place] Health Choice, but were declined, whose parents can pay a reduced fee at time of service.  [facility] Department of [facility]  [address], [place] [phone] Accepts children up to age 35 who are enrolled in [place] or [place]; pregnant women with a Medicaid card; and children who have applied for Medicaid or [place] Health Choice, but were declined, whose parents can pay a reduced fee at time of service.  [facility] Adult [facility] Access PROGRAM  [place] [phone] Patients are seen by appointment only. Walk-ins are not accepted. [place] will see patients 36 years of age and older. Monday - Tuesday (8am-5pm) Most Wednesdays (8:30-5pm) $30 per visit, cash only  [facility] Adult Dental Access PROGRAM  [address], [facility] [phone] Patients are seen by appointment only. Walk-ins are not accepted. [place] will see patients 50 years of age and older. One Wednesday Evening (Monthly: Volunteer Based).  $30 per visit, cash only  [place]  [phone] for adults; Children under age 66, call Graduate Pediatric Dentistry at [phone]. Children aged 47-14, please call [phone] to request a pediatric application.  Dental services are provided in all areas of dental care including fillings, crowns and bridges, complete and partial dentures, implants, gum treatment, root canals, and extractions. Preventive care is also provided. Treatment is provided to both adults and children. Patients are selected via a lottery and there is often a waiting list.   [facility] [address] [place]  [phone] [email or web address]   [facility] [address] [place], [place] [phone], Ext. [phone] Second and Fourth Thursday of each month, opens at 6:30 AM; Clinic ends at 9 AM.  Patients are seen on a first-come first-served basis, and a limited number are seen during each clinic.   [facility]  [address] Hillard Danker [place], [place] [phone]   Eligibility Requirements You must have lived in [place], [place], or [place] counties for at least the last three months.   You cannot be eligible for state or federal sponsored [employer], including [employer], [place], or [employer].   You generally cannot be eligible for healthcare insurance through your employer.    How to apply: Eligibility screenings are held every Tuesday and Wednesday afternoon from 1:00 pm until 4:00 pm. You do not need an appointment for the interview!  [facility] [address], [place], [place]   [place]  [place] Department  [place]  [phone]    Behavioral Health Resources in the Community: Intensive Outpatient Programs Organization         Address  Phone  Notes  [place]. [address] [place], [place] [phone]   [facility] Outpatient [address], [place], [place]   ADS: Alcohol & Drug Svcs  [address], [place], [place]   [place] [address],  [place], [place] or [phone]   Substance Abuse Resources Organization         Address  Phone  Notes  Alcohol and Drug Services  [place]  [phone]   [facility] [place]  [phone]   Chinita Pester  [phone]   Residential & Outpatient Substance Abuse Program  [phone]   Psychological Services Organization         Address  Phone  Notes  [facility] [place]  [place]  [phone]   [place] [address], [place] or [phone]    Mobile Crisis Teams Organization         Address  Phone  Notes  [facility], Mobile Crisis Care Unit  [phone]   [facility]ive Psychotherapeutic Services  [address] [place], [place]   [place] [address], [place] [phone]    Self-Help/Support Groups Organization         Address  Phone             Notes  [place] - variety of support groups  [place] Call for more information  Narcotics Anonymous (NA), [facility] [address], [employer] [place]  2 meetings at this location   Special educational needs teacher         Address  Phone  Notes  ASAP Residential Treatment [place],    [place]  [phone]   [facility]  [address], [place] [phone], [place], [place]   [place], [place] [phone] Admissions: 8am-3pm M-F  Incentives Substance [place] [address]    [place], [place] [phone]   [facility] [address] [place], [place], [place]   [facility] [address]  [place], [place]   [facility] Programs - Intensive Outpatient [address]een [address], [place], [place]   [facility] [address].,  [place], [place] [phone] or [phone]   [facility] (RTS) [address] [place], [place] Accepts Medicaid  [facility] [place] [address]  [place] [phone] Substance Abuse/Addiction Treatment   [facility] Organization         Address  Phone  Notes  [facility]  [phone]   Domenic Schwab, PhD [address] [place], [place]   [phone] or [phone]   [place], [place] [phone]   [facility] [address] [place], [place] [phone] Insurance/Medicaid/sponsorship through [facility] and Families [address], [address] [place]                                    [place], [place] [phone] [place] [address], [place] [phone]    Dr. Adele Schilder  [phone]   [facility] Dept. 1) [address], [place] 2) [place] 3)  [place] 65, [place] [phone]  [phone]   [place] [phone] or [phone] (After Hours)

## 2015-04-04 ENCOUNTER — Encounter (HOSPITAL_COMMUNITY): Payer: Self-pay

## 2015-04-04 ENCOUNTER — Emergency Department (HOSPITAL_COMMUNITY): Payer: Medicaid Other

## 2015-04-04 ENCOUNTER — Emergency Department (HOSPITAL_COMMUNITY)
Admission: EM | Admit: 2015-04-04 | Discharge: 2015-04-04 | Disposition: A | Discharge status: Home / Self Care | Payer: Medicaid Other | Attending: Emergency Medicine | Admitting: Emergency Medicine

## 2015-04-04 DIAGNOSIS — Y998 Other external cause status: Secondary | ICD-10-CM | POA: Insufficient documentation

## 2015-04-04 DIAGNOSIS — Y9389 Activity, other specified: Secondary | ICD-10-CM | POA: Insufficient documentation

## 2015-04-04 DIAGNOSIS — F17210 Nicotine dependence, cigarettes, uncomplicated: Secondary | ICD-10-CM | POA: Insufficient documentation

## 2015-04-04 DIAGNOSIS — G8929 Other chronic pain: Secondary | ICD-10-CM | POA: Insufficient documentation

## 2015-04-04 DIAGNOSIS — Y9289 Other specified places as the place of occurrence of the external cause: Secondary | ICD-10-CM | POA: Insufficient documentation

## 2015-04-04 DIAGNOSIS — X58XXXA Exposure to other specified factors, initial encounter: Secondary | ICD-10-CM | POA: Insufficient documentation

## 2015-04-04 DIAGNOSIS — M25511 Pain in right shoulder: Secondary | ICD-10-CM

## 2015-04-04 DIAGNOSIS — Z8739 Personal history of other diseases of the musculoskeletal system and connective tissue: Secondary | ICD-10-CM | POA: Insufficient documentation

## 2015-04-04 DIAGNOSIS — S4991XA Unspecified injury of right shoulder and upper arm, initial encounter: Secondary | ICD-10-CM | POA: Insufficient documentation

## 2015-04-04 DIAGNOSIS — Z862 Personal history of diseases of the blood and blood-forming organs and certain disorders involving the immune mechanism: Secondary | ICD-10-CM | POA: Insufficient documentation

## 2015-04-04 DIAGNOSIS — Z8659 Personal history of other mental and behavioral disorders: Secondary | ICD-10-CM | POA: Insufficient documentation

## 2015-04-04 MED ORDER — NAPROXEN 375 MG PO TABS: 375.0000 mg | ORAL_TABLET | Freq: Two times a day (BID) | ORAL | Status: DC

## 2015-04-04 MED ORDER — OXYCODONE-ACETAMINOPHEN 5-325 MG PO TABS: 1.0000 | ORAL_TABLET | ORAL | Status: DC | PRN

## 2015-04-04 MED ORDER — NAPROXEN 250 MG PO TABS
375.0000 mg | ORAL_TABLET | Freq: Once | ORAL | Status: AC
  Administered 2015-04-04: 375 mg via ORAL
  Filled 2015-04-04: qty 2

## 2015-04-04 MED ORDER — OXYCODONE-ACETAMINOPHEN 5-325 MG PO TABS
1.0000 | ORAL_TABLET | Freq: Once | ORAL | Status: AC
  Administered 2015-04-04: 1 via ORAL
  Filled 2015-04-04: qty 1

## 2015-04-04 NOTE — Discharge Instructions (Signed)
Shoulder Pain The shoulder is the joint that connects your arms to your body. The bones that form the shoulder joint include the upper arm bone (humerus), the shoulder blade (scapula), and the collarbone (clavicle). The top of the humerus is shaped like a ball and fits into a rather flat socket on the scapula (glenoid cavity). A combination of muscles and strong, fibrous tissues that connect muscles to bones (tendons) support your shoulder joint and hold the ball in the socket. Small, fluid-filled sacs (bursae) are located in different areas of the joint. They act as cushions between the bones and the overlying soft tissues and help reduce friction between the gliding tendons and the bone as you move your arm. Your shoulder joint allows a wide range of motion in your arm. This range of motion allows you to do things like scratch your back or throw a ball. However, this range of motion also makes your shoulder more prone to pain from overuse and injury. Causes of shoulder pain can originate from both injury and overuse and usually can be grouped in the following four categories:  Redness, swelling, and pain (inflammation) of the tendon (tendinitis) or the bursae (bursitis).  Instability, such as a dislocation of the joint.  Inflammation of the joint (arthritis).  Broken bone (fracture). HOME CARE INSTRUCTIONS   Apply ice to the sore area.  Put ice in a plastic bag.  Place a towel between your skin and the bag.  Leave the ice on for 15-20 minutes, 3-4 times per day for the first 2 days, or as directed by your health care provider.  Stop using cold packs if they do not help with the pain.  If you have a shoulder sling or immobilizer, wear it as long as your caregiver instructs. Only remove it to shower or bathe. Move your arm as little as possible, but keep your hand moving to prevent swelling.  Squeeze a soft ball or foam pad as much as possible to help prevent swelling.  Only take  over-the-counter or prescription medicines for pain, discomfort, or fever as directed by your caregiver. SEEK MEDICAL CARE IF:   Your shoulder pain increases, or new pain develops in your arm, hand, or fingers.  Your hand or fingers become cold and numb.  Your pain is not relieved with medicines. SEEK IMMEDIATE MEDICAL CARE IF:   Your arm, hand, or fingers are numb or tingling.  Your arm, hand, or fingers are significantly swollen or turn white or blue. MAKE SURE YOU:   Understand these instructions.  Will watch your condition.  Will get help right away if you are not doing well or get worse.   This information is not intended to replace advice given to you by your health care provider. Make sure you discuss any questions you have with your health care provider.   Document Released: 02/03/2005 Document Revised: 05/17/2014 Document Reviewed: 08/19/2014 Elsevier Interactive Patient Education 2016 [place] ice to the right shoulder and collarbone as much as possible over the next several days.  Use the sling to rest your shoulder joint.  You may take the percocet prescribed for pain relief.  This will make you drowsy - do not drive within 4 hours of taking this medication.

## 2015-04-04 NOTE — ED Notes (Signed)
Right shoulder pain, may have hurt it at work.

## 2015-04-06 NOTE — ED Provider Notes (Signed)
CSN: [id number]     Arrival date & time 04/04/15  2110 History   First MD Initiated Contact with Patient 04/04/15 2120     Chief Complaint  Patient presents with  . Shoulder Pain     (Consider location/radiation/quality/duration/timing/severity/associated sxs/prior Treatment) The history is provided by the patient.   Brianna Hughes is a 29 y.o. female presenting with right clavicle pain.  She has to lift up to 80 lb boxed at work and reports chronic right shoulder and collarbone pain.  Yesterday she felt a sudden popping sensation when lifting a box and has had worsened pain since.  She has persistent popping now with certain arm movement.  Her pain radiates to her scapula.  She denies radiation of pain into her arm, also denies weakness or numbness in her upper extremities.  She has tried to rest the arm without improvement in pain.     Past Medical History  Diagnosis Date  . Scoliosis   . Headache(784.0)   . Anxiety   . Depression   . Chronic back pain   . Chronic headaches   . Chronic ankle pain   . Chronic knee pain   . Anemia    Past Surgical History  Procedure Laterality Date  . Mouth surgery     Family History  Problem Relation Age of Onset  . Anesthesia problems Neg Hx   . Hypotension Neg Hx   . Malignant hyperthermia Neg Hx   . Pseudochol deficiency Neg Hx    Social History  Substance Use Topics  . Smoking status: Current Every Day Smoker -- 0.25 packs/day for 10 years    Types: Cigarettes  . Smokeless tobacco: Never Used  . Alcohol Use: No   OB History    Gravida Para Term Preterm AB TAB SAB Ectopic Multiple Living   2 2 2  0 0 0 0 0 0 2     Review of Systems  Constitutional: Negative for fever.  Musculoskeletal: Positive for arthralgias. Negative for myalgias and joint swelling.  Neurological: Negative for weakness and numbness.      Allergies  Hydrocodone  Home Medications   Prior to Admission medications   Medication Sig Start Date End  Date Taking? Authorizing Provider  etonogestrel (IMPLANON) 68 MG IMPL implant Inject 1 each into the skin once.    Historical Provider, MD  meclizine (ANTIVERT) 25 MG tablet Take 1 tablet (25 mg total) by mouth 3 (three) times daily as needed for dizziness or nausea. 03/02/15   Orlie Dakin, MD  naproxen (NAPROSYN) 375 MG tablet Take 1 tablet (375 mg total) by mouth 2 (two) times daily. 04/04/15   Evalee Jefferson, PA-C  oxyCODONE-acetaminophen (PERCOCET/ROXICET) 5-325 MG tablet Take 1 tablet by mouth every 4 (four) hours as needed. 04/04/15   Evalee Jefferson, PA-C   BP 114/67 mmHg  Pulse 62  Temp(Src) 98.4 F (36.9 C)  Resp 14  SpO2 99%  LMP 03/11/2015 (Approximate) Physical Exam  Constitutional: She appears well-developed and well-nourished.  HENT:  Head: Atraumatic.  Neck: Normal range of motion.  Musculoskeletal: She exhibits tenderness.       Right shoulder: She exhibits bony tenderness and crepitus. She exhibits no swelling, no effusion, no deformity, no spasm and normal strength.  Reproducible crepitus at proximal right clavicle with right arm extension.  No palpable deformity or edema.    Neurological: She is alert. She has normal strength. She displays normal reflexes. No sensory deficit.  Equal grip strength  Skin: Skin is  warm and dry.  Psychiatric: She has a normal mood and affect.    ED Course  Procedures (including critical care time) Labs Review Labs Reviewed - No data to display  Imaging Review No results found. I have personally reviewed and evaluated these images and lab results as part of my medical decision-making.   EKG Interpretation None      MDM   Final diagnoses:  Clavicle pain, right     Radiological studies were viewed, interpreted and considered during the medical decision making and disposition process. I agree with radiologists reading.  Results were also discussed with patient. Suspect clavicle ligament sprain/tear.  No fx.  She was placed in  sling, discussed need to maintain ROM,  Ice tx, naproxen, oxycodone prescribed. Referral to Dr. Luna Glasgow for further eval/management.  Also referral to [facility] as no current pcp.  The patient appears reasonably screened and/or stabilized for discharge and I doubt any other medical condition or other [facility] requiring further screening, evaluation, or treatment in the ED at this time prior to discharge.      Evalee Jefferson, PA-C 04/06/15 2206  Ezequiel Essex, MD 04/07/15 1056

## 2015-11-03 ENCOUNTER — Emergency Department (HOSPITAL_COMMUNITY)
Admission: EM | Admit: 2015-11-03 | Discharge: 2015-11-03 | Disposition: A | Discharge status: Home / Self Care | Payer: Medicaid Other | Attending: Emergency Medicine | Admitting: Emergency Medicine

## 2015-11-03 ENCOUNTER — Encounter (HOSPITAL_COMMUNITY): Payer: Self-pay | Admitting: Emergency Medicine

## 2015-11-03 DIAGNOSIS — R0602 Shortness of breath: Secondary | ICD-10-CM | POA: Insufficient documentation

## 2015-11-03 DIAGNOSIS — Z7982 Long term (current) use of aspirin: Secondary | ICD-10-CM | POA: Insufficient documentation

## 2015-11-03 DIAGNOSIS — M549 Dorsalgia, unspecified: Secondary | ICD-10-CM | POA: Insufficient documentation

## 2015-11-03 DIAGNOSIS — G43909 Migraine, unspecified, not intractable, without status migrainosus: Secondary | ICD-10-CM | POA: Insufficient documentation

## 2015-11-03 DIAGNOSIS — G43901 Migraine, unspecified, not intractable, with status migrainosus: Secondary | ICD-10-CM

## 2015-11-03 DIAGNOSIS — R197 Diarrhea, unspecified: Secondary | ICD-10-CM | POA: Insufficient documentation

## 2015-11-03 DIAGNOSIS — F329 Major depressive disorder, single episode, unspecified: Secondary | ICD-10-CM | POA: Insufficient documentation

## 2015-11-03 DIAGNOSIS — F17210 Nicotine dependence, cigarettes, uncomplicated: Secondary | ICD-10-CM | POA: Insufficient documentation

## 2015-11-03 LAB — BASIC METABOLIC PANEL
Anion gap: 6 (ref 5–15)
BUN: 18 mg/dL (ref 6–20)
CO2: 27 mmol/L (ref 22–32)
Calcium: 9.3 mg/dL (ref 8.9–10.3)
Chloride: 105 mmol/L (ref 101–111)
Creatinine, Ser: 0.85 mg/dL (ref 0.44–1.00)
GFR calc Af Amer: >60 mL/min (ref >60)
GFR calc non Af Amer: >60 mL/min (ref >60)
GLUCOSE: 89 mg/dL (ref 65–99)
POTASSIUM: 4.3 mmol/L (ref 3.5–5.1)
Sodium: 138 mmol/L (ref 135–145)

## 2015-11-03 LAB — CBC WITH DIFFERENTIAL/PLATELET
Basophils Absolute: 0 10*3/uL (ref 0.0–0.1)
Basophils Relative: 0 %
EOS PCT: 1 %
Eosinophils Absolute: 0.1 10*3/uL (ref 0.0–0.7)
HCT: 44.4 % (ref 36.0–46.0)
Hemoglobin: 15.5 g/dL — ABNORMAL HIGH (ref 12.0–15.0)
LYMPHS ABS: 2.7 10*3/uL (ref 0.7–4.0)
Lymphocytes Relative: 28 %
MCH: 32 pg (ref 26.0–34.0)
MCHC: 34.9 g/dL (ref 30.0–36.0)
MCV: 91.5 fL (ref 78.0–100.0)
MONO ABS: 0.6 10*3/uL (ref 0.1–1.0)
Monocytes Relative: 6 %
Neutro Abs: 6.4 10*3/uL (ref 1.7–7.7)
Neutrophils Relative %: 65 %
PLATELETS: 288 10*3/uL (ref 150–400)
RBC: 4.85 MIL/uL (ref 3.87–5.11)
RDW: 12.1 % (ref 11.5–15.5)
WBC: 9.8 10*3/uL (ref 4.0–10.5)

## 2015-11-03 LAB — URINALYSIS, ROUTINE W REFLEX MICROSCOPIC
Bilirubin Urine: NEGATIVE
GLUCOSE, UA: NEGATIVE mg/dL
HGB URINE DIPSTICK: NEGATIVE
Ketones, ur: 15 mg/dL — AB
Leukocytes, UA: NEGATIVE
Nitrite: NEGATIVE
PH: 5.5 (ref 5.0–8.0)
PROTEIN: NEGATIVE mg/dL

## 2015-11-03 LAB — PREGNANCY, URINE: Preg Test, Ur: NEGATIVE

## 2015-11-03 MED ORDER — DEXAMETHASONE SODIUM PHOSPHATE 4 MG/ML IJ SOLN
10.0000 mg | Freq: Once | INTRAMUSCULAR | Status: AC
  Administered 2015-11-03: 10 mg via INTRAVENOUS
  Filled 2015-11-03: qty 3

## 2015-11-03 MED ORDER — PROMETHAZINE HCL 25 MG/ML IJ SOLN
12.5000 mg | Freq: Once | INTRAMUSCULAR | Status: AC
  Administered 2015-11-03: 12.5 mg via INTRAVENOUS
  Filled 2015-11-03: qty 1

## 2015-11-03 MED ORDER — SODIUM CHLORIDE 0.9 % IV BOLUS (SEPSIS)
1000.0000 mL | Freq: Once | INTRAVENOUS | Status: AC
  Administered 2015-11-03: 1000 mL via INTRAVENOUS

## 2015-11-03 MED ORDER — KETOROLAC TROMETHAMINE 30 MG/ML IJ SOLN
30.0000 mg | Freq: Once | INTRAMUSCULAR | Status: AC
Start: 2015-11-03 — End: 2015-11-03
  Administered 2015-11-03: 30 mg via INTRAVENOUS
  Filled 2015-11-03: qty 1

## 2015-11-03 MED ORDER — DIPHENHYDRAMINE HCL 50 MG/ML IJ SOLN
25.0000 mg | Freq: Once | INTRAMUSCULAR | Status: AC
  Administered 2015-11-03: 25 mg via INTRAVENOUS
  Filled 2015-11-03: qty 1

## 2015-11-03 MED ORDER — SODIUM CHLORIDE 0.9 % IV SOLN
INTRAVENOUS | Status: DC
  Administered 2015-11-03: 1000 mL via INTRAVENOUS

## 2015-11-03 NOTE — ED Notes (Signed)
Pt states she has been weak & dizzy x 2 weeks. Complaining of general body aches. Pt states V/D also.

## 2015-11-03 NOTE — ED Notes (Signed)
Pt alert & oriented x4, stable gait. Patient given discharge instructions, paperwork & prescription(s). Patient  instructed to stop at the registration desk to finish any additional paperwork. Patient verbalized understanding. Pt left department w/ no further questions. 

## 2015-11-03 NOTE — Discharge Instructions (Signed)
Migraine Headache A migraine headache is very bad, throbbing pain on one or both sides of your head. Talk to your doctor about what things may bring on (trigger) your migraine headaches. HOME CARE  Only take medicines as told by your doctor.  Lie down in a dark, quiet room when you have a migraine.  Keep a journal to find out if certain things bring on migraine headaches. For example, write down:  What you eat and drink.  How much sleep you get.  Any change to your diet or medicines.  Lessen how much alcohol you drink.  Quit smoking if you smoke.  Get enough sleep.  Lessen any stress in your life.  Keep lights dim if bright lights bother you or make your migraines worse. GET HELP RIGHT AWAY IF:   Your migraine becomes really bad.  You have a fever.  You have a stiff neck.  You have trouble seeing.  Your muscles are weak, or you lose muscle control.  You lose your balance or have trouble walking.  You feel like you will pass out (faint), or you pass out.  You have really bad symptoms that are different than your first symptoms. MAKE SURE YOU:   Understand these instructions.  Will watch your condition.  Will get help right away if you are not doing well or get worse.   This information is not intended to replace advice given to you by your health care provider. Make sure you discuss any questions you have with your health care provider.  Go home and rest. Return for any new or worse symptoms. Labs here today without significant abnormalities.   Document Released: 02/03/2008 Document Revised: 07/19/2011 Document Reviewed: 01/01/2013 Elsevier Interactive Patient Education [employer].

## 2015-11-03 NOTE — ED Notes (Signed)
Pt c/o weakness and dizziness x 2 weeks. States her dad and boyfriend have been feeling the same. Pt also states she has a migraine and has been nauseated. Pt states she has found "a bunch of ticks on her".

## 2015-11-03 NOTE — ED Provider Notes (Signed)
CSN: [id number]     Arrival date & time 11/03/15  1929 History  By signing my name below, I, Brianna Hughes, attest that this documentation has been prepared under the direction and in the presence of Brianna Sorrow, MD . Electronically Signed: Higinio Hughes, Scribe. 11/03/2015. 9:33 PM.   Chief Complaint  Patient presents with  . Dizziness  . Weakness  . migraines    Patient is a 30 y.o. female presenting with dizziness and weakness. The history is provided by the patient. No language interpreter was used.  Dizziness Quality:  Lightheadedness Severity:  Mild Onset quality:  Gradual Duration:  2 weeks Timing:  Intermittent Progression:  Worsening Chronicity:  New Relieved by:  Nothing Worsened by:  Nothing Ineffective treatments:  None tried Associated symptoms: chest pain, diarrhea, headaches, nausea, shortness of breath, vomiting and weakness   Weakness Associated symptoms include chest pain, headaches and shortness of breath. Pertinent negatives include no abdominal pain.   HPI Comments: Brianna Hughes is a 30 y.o. female with PMHx of chronic headaches and chronic back pain, who presents to the Emergency Department complaining of weakness and fatigue that began ~2 weeks ago. Pt reports she felt like she was going to pass out while driving earlier today. Pt also complains of dizziness, lightheadedness, and migraines that began ~2 weeks ago. She states she has experienced a migraine every day for the past 2 weeks; however, she reports a hx of migraines. Pt states she has experienced a migraine every day for the past 2 weeks; however she reports a hx of migraines. She notes her migraines radiate towards her bilateral shoulders and she experiences double vision and phonophobia.  She states associated symptoms of diarrhea, vomiting, intermittent abdominal pain, and back pain. Pt reports her boyfriend and father have also felt sick recently; though their symptoms did not arise until a few days ago.  Pt also notes she has swelling in her right ankle due to a tumor that she has been told she needs surgery to remove. Pt denies any other complaints.   Past Medical History  Diagnosis Date  . Scoliosis   . Headache(784.0)   . Anxiety   . Depression   . Chronic back pain   . Chronic headaches   . Chronic ankle pain   . Chronic knee pain   . Anemia    Past Surgical History  Procedure Laterality Date  . Mouth surgery     Family History  Problem Relation Age of Onset  . Anesthesia problems Neg Hx   . Hypotension Neg Hx   . Malignant hyperthermia Neg Hx   . Pseudochol deficiency Neg Hx    Social History  Substance Use Topics  . Smoking status: Current Every Day Smoker -- 0.25 packs/day for 10 years    Types: Cigarettes  . Smokeless tobacco: Never Used  . Alcohol Use: No   OB History    Gravida Para Term Preterm AB TAB SAB Ectopic Multiple Living   2 2 2  0 0 0 0 0 0 2     Review of Systems  Constitutional: Positive for chills. Negative for fever.  HENT: Negative for rhinorrhea and sore throat.   Eyes: Positive for photophobia and visual disturbance (Blurry vision).  Respiratory: Positive for shortness of breath. Negative for cough.   Cardiovascular: Positive for chest pain and leg swelling (Right ankle due to tumor).  Gastrointestinal: Positive for nausea, vomiting and diarrhea. Negative for abdominal pain.  Genitourinary: Negative for dysuria.  Musculoskeletal:  Positive for back pain.  Skin: Negative for rash.  Neurological: Positive for dizziness, weakness and headaches.  Psychiatric/Behavioral: Negative for confusion.   Allergies  Hydrocodone  Home Medications   Prior to Admission medications   Medication Sig Start Date End Date Taking? Authorizing Provider  Aspirin-Salicylamide-Caffeine (BC HEADACHE) 325-95-16 MG TABS Take 1 packet by mouth daily as needed (for pain).   Yes Historical Provider, MD  etonogestrel (IMPLANON) 68 MG IMPL implant Inject 1 each into  the skin once.    Historical Provider, MD   Triage Vitals: BP 103/100 mmHg  Pulse 73  Temp(Src) 98.4 F (36.9 C) (Oral)  Resp 20  Ht 5\' 5"  (1.651 m)  Wt 120 lb (54.432 kg)  BMI 19.97 kg/m2  SpO2 100%  LMP 10/16/2015 Physical Exam  Constitutional: She is oriented to person, place, and time. She appears well-developed and well-nourished.  HENT:  Head: Normocephalic and atraumatic.  Moist mucous membranes.  Eyes: Conjunctivae are normal. Right eye exhibits no discharge. Left eye exhibits no discharge. No scleral icterus.  Neck: Normal range of motion. Neck supple. No tracheal deviation present.  Cardiovascular: Normal rate, regular rhythm and normal heart sounds.   Pulmonary/Chest: Effort normal and breath sounds normal.  Abdominal: Soft. Bowel sounds are normal. She exhibits no distension. There is no tenderness. There is no guarding.  Belly flat  Musculoskeletal: She exhibits no edema.  No pitting edema No swelling to right ankle  Neurological: She is alert and oriented to person, place, and time. No cranial nerve deficit. She exhibits normal muscle tone. Coordination normal.  Skin: Skin is warm. No rash noted.  Psychiatric: She has a normal mood and affect.  Nursing note and vitals reviewed.   ED Course  Procedures  DIAGNOSTIC STUDIES:  Oxygen Saturation is 100% on RA, normal by my interpretation.    COORDINATION OF CARE:  9:23 PM Discussed treatment Hughes, which includes migraine cocktail with pt at bedside and pt agreed to Hughes.  10:16 PM Pt states she is feeling much better after taking medication and is ready to go home.   Labs Review Labs Reviewed  CBC WITH DIFFERENTIAL/PLATELET - Abnormal; Notable for the following:    Hemoglobin 15.5 (*)    All other components within normal limits  URINALYSIS, ROUTINE W REFLEX MICROSCOPIC (NOT AT [facility]) - Abnormal; Notable for the following:    Specific Gravity, Urine >1.030 (*)    Ketones, ur 15 (*)    All other components  within normal limits  BASIC METABOLIC PANEL  PREGNANCY, URINE   Results for orders placed or performed during the hospital encounter of 11/03/15  CBC with Differential  Result Value Ref Range   WBC 9.8 4.0 - 10.5 K/uL   RBC 4.85 3.87 - 5.11 MIL/uL   Hemoglobin 15.5 (H) 12.0 - 15.0 g/dL   HCT 44.4 36.0 - 46.0 %   MCV 91.5 78.0 - 100.0 fL   MCH 32.0 26.0 - 34.0 pg   MCHC 34.9 30.0 - 36.0 g/dL   RDW 12.1 11.5 - 15.5 %   Platelets 288 150 - 400 K/uL   Neutrophils Relative % 65 %   Neutro Abs 6.4 1.7 - 7.7 K/uL   Lymphocytes Relative 28 %   Lymphs Abs 2.7 0.7 - 4.0 K/uL   Monocytes Relative 6 %   Monocytes Absolute 0.6 0.1 - 1.0 K/uL   Eosinophils Relative 1 %   Eosinophils Absolute 0.1 0.0 - 0.7 K/uL   Basophils Relative 0 %  Basophils Absolute 0.0 0.0 - 0.1 K/uL  Basic metabolic panel  Result Value Ref Range   Sodium 138 135 - 145 mmol/L   Potassium 4.3 3.5 - 5.1 mmol/L   Chloride 105 101 - 111 mmol/L   CO2 27 22 - 32 mmol/L   Glucose, Bld 89 65 - 99 mg/dL   BUN 18 6 - 20 mg/dL   Creatinine, Ser 0.85 0.44 - 1.00 mg/dL   Calcium 9.3 8.9 - 10.3 mg/dL   GFR calc non Af Amer >60 >60 mL/min   GFR calc Af Amer >60 >60 mL/min   Anion gap 6 5 - 15  Urinalysis, Routine w reflex microscopic  Result Value Ref Range   Color, Urine YELLOW YELLOW   APPearance CLEAR CLEAR   Specific Gravity, Urine >1.030 (H) 1.005 - 1.030   pH 5.5 5.0 - 8.0   Glucose, UA NEGATIVE NEGATIVE mg/dL   Hgb urine dipstick NEGATIVE NEGATIVE   Bilirubin Urine NEGATIVE NEGATIVE   Ketones, ur 15 (A) NEGATIVE mg/dL   Protein, ur NEGATIVE NEGATIVE mg/dL   Nitrite NEGATIVE NEGATIVE   Leukocytes, UA NEGATIVE NEGATIVE  Pregnancy, urine  Result Value Ref Range   Preg Test, Ur NEGATIVE NEGATIVE    Imaging Review No results found. I have personally reviewed and evaluated these images and lab results as part of my medical decision-making.   EKG Interpretation None      MDM   Final diagnoses:  Migraine  with status migrainosus, not intractable, unspecified migraine type    Patient with history of migraine for the past several days. Patient treated here with migraine cocktail and feels significantly better. Patient has 2 other family members that this came down with similar illness over the last few days so it is possible that the precipitating illness was viral. Patient feels much better and feels ready to go home. Nontoxic no acute distress. No concerns for meningitis.      I personally performed the services described in this documentation, which was scribed in my presence. The recorded information has been reviewed and is accurate.      Brianna Sorrow, MD 11/03/15 2217

## 2015-11-03 NOTE — ED Notes (Signed)
Pt also states she has been on her period for 2.5 weeks.

## 2015-11-10 ENCOUNTER — Encounter: Payer: Self-pay | Admitting: Obstetrics and Gynecology

## 2015-11-19 ENCOUNTER — Encounter: Payer: Self-pay | Admitting: Obstetrics and Gynecology

## 2015-11-19 ENCOUNTER — Other Ambulatory Visit (HOSPITAL_COMMUNITY)
Admission: RE | Admit: 2015-11-19 | Discharge: 2015-11-19 | Disposition: A | Discharge status: Home / Self Care | Payer: Self-pay | Source: Ambulatory Visit | Attending: Obstetrics and Gynecology | Admitting: Obstetrics and Gynecology

## 2015-11-19 ENCOUNTER — Ambulatory Visit (INDEPENDENT_AMBULATORY_CARE_PROVIDER_SITE_OTHER): Payer: Medicaid Other | Admitting: Obstetrics and Gynecology

## 2015-11-19 VITALS — BP 120/80 | Ht 65.0 in | Wt 118.0 lb

## 2015-11-19 DIAGNOSIS — Z308 Encounter for other contraceptive management: Secondary | ICD-10-CM

## 2015-11-19 DIAGNOSIS — Z113 Encounter for screening for infections with a predominantly sexual mode of transmission: Secondary | ICD-10-CM | POA: Insufficient documentation

## 2015-11-19 DIAGNOSIS — Z3040 Encounter for surveillance of contraceptives, unspecified: Secondary | ICD-10-CM

## 2015-11-19 DIAGNOSIS — Z01411 Encounter for gynecological examination (general) (routine) with abnormal findings: Secondary | ICD-10-CM | POA: Insufficient documentation

## 2015-11-19 DIAGNOSIS — Z3009 Encounter for other general counseling and advice on contraception: Secondary | ICD-10-CM

## 2015-11-19 MED ORDER — NORETHINDRONE 0.35 MG PO TABS: 1.0000 | ORAL_TABLET | Freq: Every day | ORAL | Status: DC

## 2015-11-19 NOTE — Progress Notes (Signed)
Patient ID: [name], female   DOB: 1985/08/01, 30 y.o.   MRN: [id number]   Assessment:  Annual Gyn Exam BTB on year 4 of Nexplanon    Plan:  1. pap smear done, next pap due in 3 years 2. return annually or prn 3    Annual mammogram and self breast exams advised 4. HIV, RPR, serum HCG ordered today  5. F/u ASAP for Nexplanon removal and reinsertion   Subjective:  CHRISTINEMARIE ROUCH is a 30 y.o. female G2P2002 who presents for annual exam. Patient's last menstrual period was 10/13/2015. The patient has complaints today of waxing and waning vaginal bleeding since 10/13/15. Pt states her Nexplanon expired 1 year ago, and would like to get it replaced. Pt notes that she had regular periods on the Nexplanon. She states she occasionally has unprotected sex. She also reports that her weight has gone from 154 lbs last year to 118 lbs today, which she attributes to stress. Pt reports that she was homeless for a period of time and had difficulties with relationships and income, but is now in the process of going to back to school and getting a new job.  Currently in apparent stable relationship,  Considering engagement. The following portions of the patient's history were reviewed and updated as appropriate: allergies, current medications, past family history, past medical history, past social history, past surgical history and problem list. Past Medical History  Diagnosis Date  . Scoliosis   . Headache(784.0)   . Anxiety   . Depression   . Chronic back pain   . Chronic headaches   . Chronic ankle pain   . Chronic knee pain   . Anemia     Past Surgical History  Procedure Laterality Date  . Mouth surgery       Current outpatient prescriptions:  .  Aspirin-Salicylamide-Caffeine (BC HEADACHE) 325-95-16 MG TABS, Take 1 packet by mouth daily as needed (for pain)., Disp: , Rfl:  .  etonogestrel (IMPLANON) 68 MG IMPL implant, Inject 1 each into the skin once., Disp: , Rfl:  .  lisinopril  (PRINIVIL,ZESTRIL) 10 MG tablet, Take 10 mg by mouth daily., Disp: , Rfl:   Review of Systems Constitutional: negative Gastrointestinal: negative Genitourinary: BTB  Objective:  BP 120/80 mmHg  Ht 5\' 5"  (1.651 m)  Wt 118 lb (53.524 kg)  BMI 19.64 kg/m2  LMP 10/13/2015   BMI: Body mass index is 19.64 kg/(m^2).  General Appearance: Alert, appropriate appearance for age. No acute distress HEENT: Grossly normal Neck / Thyroid:  Cardiovascular: RRR; normal S1, S2, no murmur Lungs: CTA bilaterally Back: No CVAT Breast Exam: No masses or nodes.No dimpling, nipple retraction or discharge. Gastrointestinal: Soft, non-tender, no masses or organomegaly Pelvic Exam: External genitalia: normal general appearance Vaginal: normal mucosa without prolapse or lesions Cervix: normal appearance, spot slightly with the brush  Adnexa: normal bimanual exam Uterus: normal single, nontender, tiny  Rectovaginal: not indicated Lymphatic Exam: Non-palpable nodes in neck, clavicular, axillary, or inguinal regions  Skin: no rash or abnormalities. 7 mm pigmented nevus just below the naval. 4 pigmented nevus to the inner thigh and one to the dorsal right foot.  Neurologic: Normal gait and speech, no tremor  Psychiatric: Alert and oriented, appropriate affect.  Urinalysis:Not done  Mallory Shirk. MD Pgr [phone] 1:46 PM    By signing my name below, I, Hansel Feinstein, attest that this documentation has been prepared under the direction and in the presence of Jonnie Kind, MD. Electronically Signed:  Hansel Feinstein, ED Scribe. 11/19/2015. 1:46 PM.  I personally performed the services described in this documentation, which was SCRIBED in my presence. The recorded information has been reviewed and considered accurate. It has been edited as necessary during review. Jonnie Kind, MD   .

## 2015-11-19 NOTE — Progress Notes (Signed)
Patient ID: Brianna Hughes, female   DOB: [birth year], 30 y.o.   MRN: [id number] Pt here today for annual exam. Pt wants to discuss removing her current nexplanon and getting another one. Pt states that she has been bleeding since June 5th and thought it maybe due to Nexplanon out of date.

## 2015-11-20 LAB — HIV ANTIBODY (ROUTINE TESTING W REFLEX): HIV Screen 4th Generation wRfx: NONREACTIVE

## 2015-11-20 LAB — RPR: RPR Ser Ql: NONREACTIVE

## 2015-11-21 LAB — CYTOLOGY - PAP

## 2015-11-24 ENCOUNTER — Ambulatory Visit (INDEPENDENT_AMBULATORY_CARE_PROVIDER_SITE_OTHER): Payer: Medicaid Other | Admitting: Obstetrics and Gynecology

## 2015-11-24 ENCOUNTER — Encounter: Payer: Self-pay | Admitting: Obstetrics and Gynecology

## 2015-11-24 VITALS — BP 100/60 | Ht 65.0 in | Wt 118.0 lb

## 2015-11-24 DIAGNOSIS — Z309 Encounter for contraceptive management, unspecified: Secondary | ICD-10-CM | POA: Insufficient documentation

## 2015-11-24 DIAGNOSIS — Z308 Encounter for other contraceptive management: Secondary | ICD-10-CM | POA: Diagnosis not present

## 2015-11-24 NOTE — Progress Notes (Signed)
Patient ID: [name], female   DOB: 08/05/1985, 30 y.o.   MRN: [id number]  [facility] PROCEDURE NOTE  Brianna Hughes is a 30 y.o. [phone] here for Nexplanon removal.  No other gynecologic concerns.  Nexplanon Removal Patient identified, informed consent performed, consent signed.   Appropriate time out taken. Nexplanon site identified.  Area prepped in usual sterile fashon. One ml of 1% lidocaine was used to anesthetize the area at the distal end of the implant. A small stab incision was made right beside the implant on the distal portion.  The Nexplanon rod was grasped using hemostats and removed without difficulty.  There was minimal blood loss. There were no complications.  3 ml of 1% lidocaine was injected around the incision for post-procedure analgesia.  Steri-strips were applied over the small incision.  A pressure bandage was applied to reduce any bruising.  The patient tolerated the procedure well and was given post procedure instructions.  Patient is planning to use Nexplanon for contraception, to be inserted at a later date. Procedure performed in cooperation with resident  By signing my name below, I, Hansel Feinstein, attest that this documentation has been prepared under the direction and in the presence of Jonnie Kind, MD. Electronically Signed: Hansel Feinstein, ED Scribe. 11/24/2015. 2:16 PM.  I personally performed the services described in this documentation, which was SCRIBED in my presence. The recorded information has been reviewed and considered accurate. It has been edited as necessary during review. Jonnie Kind, MD

## 2015-12-06 ENCOUNTER — Encounter (HOSPITAL_COMMUNITY): Payer: Self-pay | Admitting: Emergency Medicine

## 2015-12-06 ENCOUNTER — Emergency Department (HOSPITAL_COMMUNITY)
Admission: EM | Admit: 2015-12-06 | Discharge: 2015-12-06 | Disposition: A | Discharge status: Home / Self Care | Payer: Self-pay | Attending: Emergency Medicine | Admitting: Emergency Medicine

## 2015-12-06 DIAGNOSIS — Z79899 Other long term (current) drug therapy: Secondary | ICD-10-CM | POA: Insufficient documentation

## 2015-12-06 DIAGNOSIS — G43009 Migraine without aura, not intractable, without status migrainosus: Secondary | ICD-10-CM | POA: Insufficient documentation

## 2015-12-06 DIAGNOSIS — F17210 Nicotine dependence, cigarettes, uncomplicated: Secondary | ICD-10-CM | POA: Insufficient documentation

## 2015-12-06 DIAGNOSIS — Z7982 Long term (current) use of aspirin: Secondary | ICD-10-CM | POA: Insufficient documentation

## 2015-12-06 DIAGNOSIS — K0889 Other specified disorders of teeth and supporting structures: Secondary | ICD-10-CM

## 2015-12-06 DIAGNOSIS — K029 Dental caries, unspecified: Secondary | ICD-10-CM | POA: Insufficient documentation

## 2015-12-06 MED ORDER — TRAMADOL HCL 50 MG PO TABS: 50.0000 mg | ORAL_TABLET | Freq: Four times a day (QID) | ORAL | 0 refills | Status: DC | PRN

## 2015-12-06 MED ORDER — CLINDAMYCIN HCL 150 MG PO CAPS: 300.0000 mg | ORAL_CAPSULE | Freq: Four times a day (QID) | ORAL | 0 refills | Status: DC

## 2015-12-06 MED ORDER — CLINDAMYCIN HCL 150 MG PO CAPS
300.0000 mg | ORAL_CAPSULE | Freq: Once | ORAL | Status: AC
  Administered 2015-12-06: 300 mg via ORAL
  Filled 2015-12-06: qty 2

## 2015-12-06 MED ORDER — TRAMADOL HCL 50 MG PO TABS
50.0000 mg | ORAL_TABLET | Freq: Once | ORAL | Status: AC
  Administered 2015-12-06: 50 mg via ORAL
  Filled 2015-12-06: qty 1

## 2015-12-06 NOTE — ED Triage Notes (Addendum)
Patient c/o right upper and lower dental pain since Wednesday. Per patient pain from wisdom teeth. Per patient taking ibuprofen in which she last took an hour ago. Denies any fevers. Per patient migraine headache with pain. Denies any nausea or vomiting. Per patient hx of migraines.

## 2015-12-06 NOTE — Discharge Instructions (Signed)
Follow-up with a dentist soon/  you can contact one of the dentists on the list provided.

## 2015-12-06 NOTE — ED Provider Notes (Signed)
[facility] DEPT Provider Note   CSN: [id number] Arrival date & time: 12/06/15  1544  First Provider Contact:  First MD Initiated Contact with Patient 12/06/15 1604        History   Chief Complaint Chief Complaint  Patient presents with  . Dental Pain  . Migraine    HPI Brianna Hughes is a 30 y.o. female with complains of dental pain and right sided headache.  She reports onset of dental pain three days ago.  She describes a constant, throbbing pain to the right side of her jaw and ear.  Pain is worse with chewing.  She also complains of gradual onsert of right sided headache since yesterday.  She states the headache is typical of her migraines.  She has been taking OTC medications without relief.  She denies fever, facial swelling, neck pain or stiffness and difficulty swallowing.    HPI  Past Medical History:  Diagnosis Date  . Anemia   . Anxiety   . Chronic ankle pain   . Chronic back pain   . Chronic headaches   . Chronic knee pain   . Depression   . Headache(784.0)   . Scoliosis     Patient Active Problem List   Diagnosis Date Noted  . Encounter for contraceptive management 11/24/2015  . Tobacco abuse 03/07/2013  . Thrombocytopenia, unspecified ([place]) 03/05/2013  . Anemia 03/05/2013  . IVDU (intravenous drug user) 03/05/2013    Past Surgical History:  Procedure Laterality Date  . MOUTH SURGERY      OB History    Gravida Para Term Preterm AB Living   2 2 2  0 0 2   SAB TAB Ectopic Multiple Live Births   0 0 0 0 1       Home Medications    Prior to Admission medications   Medication Sig Start Date End Date Taking? Authorizing Provider  lisinopril (PRINIVIL,ZESTRIL) 10 MG tablet Take 10 mg by mouth daily.   Yes Historical Provider, MD  norethindrone (MICRONOR,CAMILA,ERRIN) 0.35 MG tablet Take 1 tablet (0.35 mg total) by mouth daily. 11/19/15  Yes Jonnie Kind, MD  Aspirin-Salicylamide-Caffeine [facility] HEADACHE) [phone] MG TABS Take 1 packet by mouth  daily as needed (for pain).    Historical Provider, MD    Family History Family History  Problem Relation Age of Onset  . Hypertension Mother   . Hypertension Father   . Diabetes Brother   . Anesthesia problems Neg Hx   . Hypotension Neg Hx   . Malignant hyperthermia Neg Hx   . Pseudochol deficiency Neg Hx     Social History Social History  Substance Use Topics  . Smoking status: Current Every Day Smoker    Packs/day: 1.00    Years: 15.00    Types: Cigarettes  . Smokeless tobacco: Never Used  . Alcohol use No     Allergies   Hydrocodone   Review of Systems Review of Systems  Constitutional: Negative for appetite change and fever.  HENT: Positive for dental problem. Negative for congestion, facial swelling, sore throat and trouble swallowing.   Eyes: Negative for pain and visual disturbance.  Musculoskeletal: Negative for neck pain and neck stiffness.  Neurological: Negative for dizziness, facial asymmetry and headaches.  Hematological: Negative for adenopathy.  All other systems reviewed and are negative.    Physical Exam Updated Vital Signs BP 148/96 (BP Location: Left Arm)   Pulse 98   Temp 98.6 F (37 C) (Oral)   Resp 16  Ht 5\' 5"  (1.651 m)   Wt 54.4 kg   LMP 11/12/2015   SpO2 100%   BMI 19.97 kg/m   Physical Exam  Constitutional: She is oriented to person, place, and time. She appears well-developed and well-nourished. No distress.  HENT:  Head: Normocephalic and atraumatic.  Right Ear: Tympanic membrane and ear canal normal.  Left Ear: Tympanic membrane and ear canal normal.  Mouth/Throat: Uvula is midline, oropharynx is clear and moist and mucous membranes are normal. No trismus in the jaw. Dental caries present. No dental abscesses or uvula swelling.  Tenderness and dental caries of the right upper and third molars. Mild edema of the surrounding gingiva.  No facial swelling, obvious dental abscess, trismus, or sublingual abnml.    Neck: Normal  range of motion. Neck supple.  Cardiovascular: Normal rate, regular rhythm and normal heart sounds.   No murmur heard. Pulmonary/Chest: Effort normal and breath sounds normal.  Musculoskeletal: Normal range of motion.  Lymphadenopathy:    She has no cervical adenopathy.  Neurological: She is alert and oriented to person, place, and time. She exhibits normal muscle tone. Coordination normal.  Skin: Skin is warm and dry.  Nursing note and vitals reviewed.    ED Treatments / Results  Labs (all labs ordered are listed, but only abnormal results are displayed) Labs Reviewed - No data to display  EKG  EKG Interpretation None       Radiology No results found.  Procedures Procedures (including critical care time)  Medications Ordered in ED Medications  clindamycin (CLEOCIN) capsule 300 mg (not administered)  traMADol (ULTRAM) tablet 50 mg (not administered)     Initial Impression / Assessment and Plan / ED Course  I have reviewed the triage vital signs and the nursing notes.  Pertinent labs & imaging results that were available during my care of the patient were reviewed by me and considered in my medical decision making (see chart for details).  Clinical Course    Pt is well appearing, non-toxic.  Has hx of migraines.  No nuchal rigidity.  Headache likely secondary to the dental pain.  No concerning sx for Ludwig's angina, no dental abscess at present.  Pt agrees to dental referral info.   Final Clinical Impressions(s) / ED Diagnoses   Final diagnoses:  Pain, dental  Migraine without aura and without status migrainosus, not intractable    New Prescriptions New Prescriptions   No medications on file     Bufford Lope 12/06/15 1646    Virgel Manifold, MD 12/06/15 [phone]

## 2015-12-06 NOTE — ED Notes (Signed)
Instructed pt to take all of antibiotics as prescribed. 

## 2015-12-08 LAB — SPECIMEN STATUS REPORT

## 2015-12-10 ENCOUNTER — Encounter: Payer: Self-pay | Admitting: Obstetrics and Gynecology

## 2015-12-10 ENCOUNTER — Ambulatory Visit (INDEPENDENT_AMBULATORY_CARE_PROVIDER_SITE_OTHER): Payer: Medicaid Other | Admitting: Obstetrics and Gynecology

## 2015-12-10 ENCOUNTER — Other Ambulatory Visit: Payer: Medicaid Other

## 2015-12-10 VITALS — BP 120/76 | Ht 65.0 in

## 2015-12-10 DIAGNOSIS — Z3049 Encounter for surveillance of other contraceptives: Secondary | ICD-10-CM | POA: Diagnosis not present

## 2015-12-10 DIAGNOSIS — Z308 Encounter for other contraceptive management: Secondary | ICD-10-CM

## 2015-12-10 DIAGNOSIS — Z3040 Encounter for surveillance of contraceptives, unspecified: Secondary | ICD-10-CM

## 2015-12-10 DIAGNOSIS — Z3202 Encounter for pregnancy test, result negative: Secondary | ICD-10-CM

## 2015-12-10 LAB — POCT URINE PREGNANCY: Preg Test, Ur: NEGATIVE

## 2015-12-10 LAB — BETA HCG QUANT (REF LAB)

## 2015-12-10 NOTE — Progress Notes (Signed)
Patient ID: Brianna Hughes, female   DOB: 1985-11-06, 30 y.o.   MRN: [id number]   [facility] PROCEDURE NOTE  SINAI HAMIDEH is a 30 y.o. [phone] here for Nexplanon insertion.  No other gynecologic concerns.  Nexplanon Insertion Procedure Patient identified, informed consent performed, consent signed.   Patient does understand that irregular bleeding is a very common side effect of this medication. She was advised to have backup contraception for one week after placement. Pregnancy test in clinic today was negative.  Appropriate time out taken.  Patient's left arm was prepped and draped in the usual sterile fashion. The ruler used to measure and mark insertion area.  Patient was prepped with alcohol swab and then injected with 3 ml of 1% lidocaine.  She was prepped with betadine, Nexplanon removed from packaging,  Device confirmed in needle, then inserted full length of needle and withdrawn per handbook instructions. Nexplanon was able to palpated in the patient's arm; patient palpated the insert herself. There was minimal blood loss.  Patient insertion site covered with guaze and a pressure bandage to reduce any bruising.  The patient tolerated the procedure well and was given post procedure instructions.        By signing my name below, I, Hansel Feinstein, attest that this documentation has been prepared under the direction and in the presence of Jonnie Kind, MD. Electronically Signed: Hansel Feinstein, ED Scribe. 12/10/15. 3:42 PM.  I personally performed the services described in this documentation, which was SCRIBED in my presence. The recorded information has been reviewed and considered accurate. It has been edited as necessary during review. Jonnie Kind, MD

## 2015-12-16 LAB — HCG, SERUM, QUALITATIVE: HCG, BETA SUBUNIT, QUAL, SERUM: NEGATIVE m[IU]/mL (ref <6)

## 2015-12-16 LAB — SPECIMEN STATUS REPORT

## 2015-12-16 MED ORDER — ETONOGESTREL 68 MG ~~LOC~~ IMPL: 68.0000 mg | DRUG_IMPLANT | Freq: Once | SUBCUTANEOUS | Status: DC

## 2016-01-07 ENCOUNTER — Encounter: Payer: Self-pay | Admitting: *Deleted

## 2016-01-07 ENCOUNTER — Ambulatory Visit: Payer: Medicaid Other | Admitting: Obstetrics and Gynecology

## 2016-04-26 ENCOUNTER — Encounter (HOSPITAL_COMMUNITY): Payer: Self-pay | Admitting: Emergency Medicine

## 2016-04-26 ENCOUNTER — Emergency Department (HOSPITAL_COMMUNITY)
Admission: EM | Admit: 2016-04-26 | Discharge: 2016-04-26 | Disposition: A | Discharge status: Home / Self Care | Payer: Self-pay | Attending: Emergency Medicine | Admitting: Emergency Medicine

## 2016-04-26 DIAGNOSIS — Z87891 Personal history of nicotine dependence: Secondary | ICD-10-CM | POA: Insufficient documentation

## 2016-04-26 DIAGNOSIS — Z79899 Other long term (current) drug therapy: Secondary | ICD-10-CM | POA: Insufficient documentation

## 2016-04-26 DIAGNOSIS — N3000 Acute cystitis without hematuria: Secondary | ICD-10-CM | POA: Insufficient documentation

## 2016-04-26 DIAGNOSIS — K0889 Other specified disorders of teeth and supporting structures: Secondary | ICD-10-CM | POA: Insufficient documentation

## 2016-04-26 DIAGNOSIS — R6884 Jaw pain: Secondary | ICD-10-CM | POA: Insufficient documentation

## 2016-04-26 LAB — URINALYSIS, ROUTINE W REFLEX MICROSCOPIC
BILIRUBIN URINE: NEGATIVE
GLUCOSE, UA: NEGATIVE mg/dL
Hgb urine dipstick: NEGATIVE
KETONES UR: NEGATIVE mg/dL
LEUKOCYTES UA: NEGATIVE
Nitrite: POSITIVE — AB
PH: 6 (ref 5.0–8.0)
Protein, ur: NEGATIVE mg/dL
Specific Gravity, Urine: 1.02 (ref 1.005–1.030)

## 2016-04-26 LAB — URINALYSIS, MICROSCOPIC (REFLEX)

## 2016-04-26 LAB — POC URINE PREG, ED: Preg Test, Ur: NEGATIVE

## 2016-04-26 MED ORDER — CEFDINIR 300 MG PO CAPS
300.0000 mg | ORAL_CAPSULE | Freq: Two times a day (BID) | ORAL | 0 refills | Status: DC
Start: 2016-04-26 — End: 2016-12-08

## 2016-04-26 NOTE — ED Provider Notes (Signed)
[facility] DEPT Provider Note   CSN: [id number] Arrival date & time: 04/26/16  [id number]   History   Chief Complaint Chief Complaint  Patient presents with  . Jaw Pain  . Otalgia    HPI Brianna Hughes is a 30 y.o. female with a history of anxiety, depression, migraines, chronic pain presenting to the ED with right sided jaw pain and bilateral flank pain. Two weeks ago, one of her right lower molars came out in pieces after she bit down on something hard. She has been having "throbbing" and "stabbing" pain in her right jaw since then. The pain extends from the right ear to the middle of her face. The pain also spreads down her jaw. The pain got worse over the weekend. Yesterday morning, she was looking at her throat when she noticed "white stuff" on her right tonsil. She started scraping it with a toothbrush and noticed a lot of pus came out. She then had a little bit of bleeding from a hole in her right tonsil. She has continued to have pain that comes and goes. The pain lasts for 5 minutes and then eases off before "grabbing" her again. She has been using BC powder once daily as well as 800mg  Ibuprofen every 4 hours and Tylenol extended release 2 tablets every few hours. She has not seen a dentist because she does not have insurance. She has tried [facility], but she has not been able to get in touch with them.  She has had bilateral flank pain L > R for the last two weeks. She denies dysuria but endorses "pressure" with urination. She has noted occasional malodorous urine. She has had chills and hot sweats for the last few days. She has also been feeling sick and fatigued. She denies any nausea or vomiting.  HPI  Past Medical History:  Diagnosis Date  . Anemia   . Anxiety   . Chronic ankle pain   . Chronic back pain   . Chronic headaches   . Chronic knee pain   . Depression   . Headache(784.0)   . Scoliosis     Patient Active Problem  List   Diagnosis Date Noted  . Encounter for contraceptive management 11/24/2015  . Tobacco abuse 03/07/2013  . Thrombocytopenia, unspecified 03/05/2013  . Anemia 03/05/2013  . IVDU (intravenous drug user) 03/05/2013    Past Surgical History:  Procedure Laterality Date  . MOUTH SURGERY      OB History    Gravida Para Term Preterm AB Living   2 2 2  0 0 2   SAB TAB Ectopic Multiple Live Births   0 0 0 0 1       Home Medications    Prior to Admission medications   Medication Sig Start Date End Date Taking? Authorizing Provider  acetaminophen (TYLENOL) 500 MG tablet Take 1,000 mg by mouth every 6 (six) hours as needed for moderate pain.   Yes Historical Provider, MD  Aspirin-Salicylamide-Caffeine (BC HEADACHE) 325-95-16 MG TABS Take 1 packet by mouth daily as needed (for pain).   Yes Historical Provider, MD  ibuprofen (ADVIL,MOTRIN) 200 MG tablet Take 800 mg by mouth every 6 (six) hours as needed for moderate pain.   Yes Historical Provider, MD  cefdinir (OMNICEF) 300 MG capsule Take 1 capsule (300 mg total) by mouth 2 (two) times daily. 04/26/16   Sela Hua, MD    Family History Family History  Problem Relation Age  of Onset  . Hypertension Mother   . Hypertension Father   . Diabetes Brother   . Anesthesia problems Neg Hx   . Hypotension Neg Hx   . Malignant hyperthermia Neg Hx   . Pseudochol deficiency Neg Hx     Social History Social History  Substance Use Topics  . Smoking status: Former Smoker    Packs/day: 1.00    Years: 15.00    Types: Cigarettes    Quit date: 03/10/2016  . Smokeless tobacco: Never Used  . Alcohol use No     Allergies   Hydrocodone and Tramadol   Review of Systems Review of Systems 10 Systems reviewed and are negative for acute change except as noted in the HPI.  Physical Exam Updated Vital Signs BP (!) 148/105 (BP Location: Left Arm)   Pulse 75   Temp 99.4 F (37.4 C) (Temporal)   Resp 18   Ht 5\' 6"  (1.676 m)   Wt 54.4  kg   LMP 04/09/2016   SpO2 99%   BMI 19.37 kg/m   Physical Exam  Constitutional: She is oriented to person, place, and time. She appears well-developed and well-nourished. No distress.  HENT:  Head: Normocephalic and atraumatic.  Hole present over right lower molar, no drainage present; oropharynx erythematous without tonsillar exudate.   Eyes: Conjunctivae and EOM are normal. Pupils are equal, round, and reactive to light.  Neck: Neck supple.  Cardiovascular: Normal rate and regular rhythm.   No murmur heard. Pulmonary/Chest: Effort normal and breath sounds normal. No respiratory distress.  Abdominal: Soft. Bowel sounds are normal. There is no tenderness.  CVA tenderness bilaterally L > R  Musculoskeletal: She exhibits no edema.  Lymphadenopathy:    She has no cervical adenopathy.  Neurological: She is alert and oriented to person, place, and time.  Skin: Skin is warm and dry.  Psychiatric: She has a normal mood and affect. Her behavior is normal. Thought content normal.  Nursing note and vitals reviewed.    ED Treatments / Results  Labs (all labs ordered are listed, but only abnormal results are displayed) Labs Reviewed  URINALYSIS, ROUTINE W REFLEX MICROSCOPIC - Abnormal; Notable for the following:       Result Value   APPearance HAZY (*)    Nitrite POSITIVE (*)    All other components within normal limits  URINALYSIS, MICROSCOPIC (REFLEX) - Abnormal; Notable for the following:    Bacteria, UA MANY (*)    Squamous Epithelial / LPF 6-30 (*)    All other components within normal limits  POC URINE PREG, ED    EKG  EKG Interpretation None       Radiology No results found.  Procedures Procedures (including critical care time)  Medications Ordered in ED Medications - No data to display   Initial Impression / Assessment and Plan / ED Course  I have reviewed the triage vital signs and the nursing notes.  Pertinent labs & imaging results that were available  during my care of the patient were reviewed by me and considered in my medical decision making (see chart for details).  Clinical Course    Brianna Hughes is a 30 year old female with a PMH of depression/anxiety, migraines, chronic pain presenting to the ED with right jaw pain and bilateral flank pain. The right jaw pain started after her lower right molar came out in pieces. She noticed some purulent drainage from her right tonsil. On exam, the area where her molar came out appears to  be well-healed without drainage. Her oropharynx is erythematous without tonsillar exudate.  She is having bilateral flank pain L > R, also with some urinary symptoms including "pressure" with urination and malodorous urine. Will order UA to rule out infection.  1:00PM: UA with many bacteria and positive nitrites. Will prescribe Omnicef x 7 days to cover both an oral infection and a UTI. Advised patient that she should follow-up with a dentist as soon as possible. Pt is otherwise safe for discharge home. She is afebrile with normal vitals.  Final Clinical Impressions(s) / ED Diagnoses   Final diagnoses:  Pain, dental  Acute cystitis without hematuria    New Prescriptions New Prescriptions   CEFDINIR (OMNICEF) 300 MG CAPSULE    Take 1 capsule (300 mg total) by mouth 2 (two) times daily.     Sela Hua, MD 04/26/16 [place], MD 04/26/16 1310    Elnora Morrison, MD 04/27/16 [phone]

## 2016-04-26 NOTE — ED Triage Notes (Signed)
Last week tooth came out after biting down, Now complaining of throat pain, state white pus coming from tonsils, today hurting bilateral in flanks.

## 2016-08-31 ENCOUNTER — Emergency Department (HOSPITAL_COMMUNITY)
Admission: EM | Admit: 2016-08-31 | Discharge: 2016-08-31 | Disposition: A | Discharge status: Home / Self Care | Payer: Self-pay | Attending: Emergency Medicine | Admitting: Emergency Medicine

## 2016-08-31 ENCOUNTER — Encounter (HOSPITAL_COMMUNITY): Payer: Self-pay | Admitting: *Deleted

## 2016-08-31 ENCOUNTER — Emergency Department (HOSPITAL_COMMUNITY): Payer: Self-pay

## 2016-08-31 DIAGNOSIS — I10 Essential (primary) hypertension: Secondary | ICD-10-CM | POA: Insufficient documentation

## 2016-08-31 DIAGNOSIS — M791 Myalgia: Secondary | ICD-10-CM | POA: Insufficient documentation

## 2016-08-31 DIAGNOSIS — M436 Torticollis: Secondary | ICD-10-CM | POA: Insufficient documentation

## 2016-08-31 DIAGNOSIS — Z79899 Other long term (current) drug therapy: Secondary | ICD-10-CM | POA: Insufficient documentation

## 2016-08-31 DIAGNOSIS — Z87891 Personal history of nicotine dependence: Secondary | ICD-10-CM | POA: Insufficient documentation

## 2016-08-31 HISTORY — DX: Essential (primary) hypertension: I10

## 2016-08-31 MED ORDER — IBUPROFEN 600 MG PO TABS: 600.0000 mg | ORAL_TABLET | Freq: Four times a day (QID) | ORAL | 0 refills | Status: DC

## 2016-08-31 MED ORDER — DIAZEPAM 5 MG PO TABS
10.0000 mg | ORAL_TABLET | Freq: Once | ORAL | Status: AC
Start: 2016-08-31 — End: 2016-08-31
  Administered 2016-08-31: 10 mg via ORAL
  Filled 2016-08-31: qty 2

## 2016-08-31 MED ORDER — DIAZEPAM 5 MG PO TABS: 5.0000 mg | ORAL_TABLET | Freq: Three times a day (TID) | ORAL | 0 refills | Status: DC

## 2016-08-31 MED ORDER — IBUPROFEN 800 MG PO TABS
800.0000 mg | ORAL_TABLET | Freq: Once | ORAL | Status: AC
  Administered 2016-08-31: 800 mg via ORAL
  Filled 2016-08-31: qty 1

## 2016-08-31 NOTE — ED Notes (Signed)
Pt back from x-ray.

## 2016-08-31 NOTE — Discharge Instructions (Signed)
Your exam in your tests suggest acute torticollis, or muscle problem of the left neck and shoulder. Please apply heating pad. Please use Valium 3 times daily. Please use ibuprofen with breakfast, lunch, dinner, and at bedtime. Please rest your neck and shoulder is much as possible.

## 2016-08-31 NOTE — ED Triage Notes (Signed)
Pt comes in with left sided neck pain and left shoulder pain starting at lunch today. Pt denies any injury. Pt states the pain shoots down her arm. She states her pain is mostly located in her neck and shoulder but has once moved into her chest when the pain was severe.

## 2016-08-31 NOTE — ED Provider Notes (Signed)
[facility] DEPT Provider Note   CSN: [id number] Arrival date & time: 08/31/16  1636     History   Chief Complaint Chief Complaint  Patient presents with  . Neck Pain    HPI Brianna Hughes is a 31 y.o. female.  Patient is a 31 year old female who presents to the immersed department with left neck pain.  Patient has a history of chronic back pain, chronic headaches, chronic knee pain, depression, anxiety. The patient states that she is having increasing pain of the left side of the neck extending into the shoulder. This started around lunchtime today and his been getting progressively worse. The pain also seems to involve the left shoulder, the left upper extremity, and at times goes into the left chest on. The pain is aggravated by movement. Nothing seems to make the pain any better. There was no unusual sweats, no vomiting, no loss of consciousness reported, and no recent trauma. The patient does states she's been under a lot of stress lately she's not sure if this has anything to do these pains are gone.      Past Medical History:  Diagnosis Date  . Anemia   . Anxiety   . Chronic ankle pain   . Chronic back pain   . Chronic headaches   . Chronic knee pain   . Depression   . Headache(784.0)   . Hypertension   . Scoliosis     Patient Active Problem List   Diagnosis Date Noted  . Encounter for contraceptive management 11/24/2015  . Tobacco abuse 03/07/2013  . Thrombocytopenia, unspecified ([place]) 03/05/2013  . Anemia 03/05/2013  . IVDU (intravenous drug user) 03/05/2013    Past Surgical History:  Procedure Laterality Date  . MOUTH SURGERY      OB History    Gravida Para Term Preterm AB Living   2 2 2  0 0 2   SAB TAB Ectopic Multiple Live Births   0 0 0 0 1       Home Medications    Prior to Admission medications   Medication Sig Start Date End Date Taking? Authorizing Provider  acetaminophen (TYLENOL) 500 MG tablet Take 1,000 mg by mouth every 6  (six) hours as needed for moderate pain.    Historical Provider, MD  Aspirin-Salicylamide-Caffeine (BC HEADACHE) 325-95-16 MG TABS Take 1 packet by mouth daily as needed (for pain).    Historical Provider, MD  cefdinir (OMNICEF) 300 MG capsule Take 1 capsule (300 mg total) by mouth 2 (two) times daily. 04/26/16   Sela Hua, MD  ibuprofen (ADVIL,MOTRIN) 200 MG tablet Take 800 mg by mouth every 6 (six) hours as needed for moderate pain.    Historical Provider, MD    Family History Family History  Problem Relation Age of Onset  . Hypertension Mother   . Hypertension Father   . Diabetes Brother   . Anesthesia problems Neg Hx   . Hypotension Neg Hx   . Malignant hyperthermia Neg Hx   . Pseudochol deficiency Neg Hx     Social History Social History  Substance Use Topics  . Smoking status: Former Smoker    Packs/day: 1.00    Years: 15.00    Types: Cigarettes    Quit date: 03/10/2016  . Smokeless tobacco: Never Used  . Alcohol use No     Allergies   Hydrocodone and Tramadol   Review of Systems Review of Systems  Musculoskeletal: Positive for myalgias and neck pain.  Psychiatric/Behavioral: The patient is  nervous/anxious.   All other systems reviewed and are negative.    Physical Exam Updated Vital Signs BP (!) 178/108 (BP Location: Right Arm)   Pulse 69   Temp 98.1 F (36.7 C) (Oral)   Resp 20   LMP 08/30/2016   SpO2 99%   Physical Exam  Constitutional: She is oriented to person, place, and time. She appears well-developed and well-nourished.  Non-toxic appearance.  HENT:  Head: Normocephalic.  Right Ear: Tympanic membrane and external ear normal.  Left Ear: Tympanic membrane and external ear normal.  Eyes: EOM and lids are normal. Pupils are equal, round, and reactive to light.  Neck: Neck supple. Muscular tenderness present. No spinous process tenderness present. Carotid bruit is not present. Decreased range of motion present.    Cardiovascular: Normal  rate, regular rhythm, normal heart sounds, intact distal pulses and normal pulses.   Pulmonary/Chest: Breath sounds normal. No respiratory distress.  Abdominal: Soft. Bowel sounds are normal. There is no tenderness. There is no guarding.  Musculoskeletal:       Cervical back: She exhibits decreased range of motion and tenderness.       Back:  Lymphadenopathy:       Head (right side): No submandibular adenopathy present.       Head (left side): No submandibular adenopathy present.    She has no cervical adenopathy.  Neurological: She is alert and oriented to person, place, and time. She has normal strength. No cranial nerve deficit or sensory deficit.  Skin: Skin is warm and dry.  Psychiatric: She has a normal mood and affect. Her speech is normal.  Nursing note and vitals reviewed.    ED Treatments / Results  Labs (all labs ordered are listed, but only abnormal results are displayed) Labs Reviewed - No data to display  EKG  EKG Interpretation None       Radiology Dg Cervical Spine Complete  Result Date: 08/31/2016 CLINICAL DATA:  Left shoulder neck pain x2 days without known injury. EXAM: CERVICAL SPINE - COMPLETE 4+ VIEW COMPARISON:  None. FINDINGS: There is no evidence of cervical spine fracture or prevertebral soft tissue swelling. Alignment is normal. No significant central or neural foraminal encroachment. No other significant bone abnormalities are identified. IMPRESSION: Negative cervical spine radiographs. Electronically Signed   By: Ashley Royalty M.D.   On: 08/31/2016 18:17    Procedures Procedures (including critical care time)  Medications Ordered in ED Medications  diazepam (VALIUM) tablet 10 mg (10 mg Oral Given 08/31/16 1745)  ibuprofen (ADVIL,MOTRIN) tablet 800 mg (800 mg Oral Given 08/31/16 1745)     Initial Impression / Assessment and Plan / ED Course  I have reviewed the triage vital signs and the nursing notes.  Pertinent labs & imaging results that  were available during my care of the patient were reviewed by me and considered in my medical decision making (see chart for details).    Final Clinical Impressions(s) / ED Diagnose  MDM Patient presents to the emergency department with what appears to be torticollis. Electrocardiogram was nonacute. The x-ray of the cervical spine is negative for acute changes or problems. Pain somewhat improved after relaxing medication, but not completely resolved. No gross neurologic deficit appreciated on examination.  I've asked patient to use warm tub soaks to the neck and shoulder area. Prescription for Valium and ibuprofen given to the patient. The patient is to follow-up with orthopedics for additional evaluation if not improving. Patient is in agreement with this plan.  Final diagnoses:  Torticollis, acute    New Prescriptions Discharge Medication List as of 08/31/2016  7:14 PM    START taking these medications   Details  diazepam (VALIUM) 5 MG tablet Take 1 tablet (5 mg total) by mouth 3 (three) times daily. For muscle strain, Starting Tue 08/31/2016, Print         Lily Kocher, PA-C 09/01/16 [place], DO 09/05/16 1120

## 2016-11-17 ENCOUNTER — Encounter (HOSPITAL_COMMUNITY): Payer: Self-pay | Admitting: Emergency Medicine

## 2016-11-17 ENCOUNTER — Emergency Department (HOSPITAL_COMMUNITY)
Admission: EM | Admit: 2016-11-17 | Discharge: 2016-11-17 | Disposition: A | Discharge status: Home / Self Care | Payer: Self-pay | Attending: Emergency Medicine | Admitting: Emergency Medicine

## 2016-11-17 ENCOUNTER — Emergency Department (HOSPITAL_COMMUNITY): Payer: Self-pay

## 2016-11-17 DIAGNOSIS — M25572 Pain in left ankle and joints of left foot: Secondary | ICD-10-CM | POA: Insufficient documentation

## 2016-11-17 DIAGNOSIS — Z79899 Other long term (current) drug therapy: Secondary | ICD-10-CM | POA: Insufficient documentation

## 2016-11-17 DIAGNOSIS — M25571 Pain in right ankle and joints of right foot: Secondary | ICD-10-CM | POA: Insufficient documentation

## 2016-11-17 DIAGNOSIS — I10 Essential (primary) hypertension: Secondary | ICD-10-CM | POA: Insufficient documentation

## 2016-11-17 DIAGNOSIS — Z87891 Personal history of nicotine dependence: Secondary | ICD-10-CM | POA: Insufficient documentation

## 2016-11-17 MED ORDER — MELOXICAM 7.5 MG PO TABS: 7.5000 mg | ORAL_TABLET | Freq: Every day | ORAL | 0 refills | Status: DC

## 2016-11-17 MED ORDER — OXYCODONE-ACETAMINOPHEN 5-325 MG PO TABS: 1.0000 | ORAL_TABLET | ORAL | 0 refills | Status: DC | PRN

## 2016-11-17 MED ORDER — LIDOCAINE 5 % EX PTCH
1.0000 | MEDICATED_PATCH | Freq: Once | CUTANEOUS | Status: DC
  Administered 2016-11-17: 1 via TRANSDERMAL
  Filled 2016-11-17 (×2): qty 1

## 2016-11-17 MED ORDER — LIDOCAINE 5 % EX PTCH: 1.0000 | MEDICATED_PATCH | CUTANEOUS | 0 refills | Status: DC

## 2016-11-17 NOTE — ED Triage Notes (Signed)
Pt c/o bilateral ankle pain x 1.5-2 weeks, but denies any injury.

## 2016-11-17 NOTE — Discharge Instructions (Signed)
Wear the aso for comfort and support.  Rest your ankle as much as possible.  You may take the oxycodone prescribed for pain relief.  This will make you drowsy - do not drive within 4 hours of taking this medication.  Try the meloxicam in place of your ibuprofen.

## 2016-11-18 NOTE — ED Provider Notes (Signed)
[facility] DEPT Provider Note   CSN: [id number] Arrival date & time: 11/17/16  2028     History   Chief Complaint Chief Complaint  Patient presents with  . Ankle Pain    HPI Brianna Hughes is a 31 y.o. 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  She endorses right pain is worse than the left, describing aching pain from her inferior heel radiating up into the lateral ankle area.  She denies specific new injury but had started a running program 3 weeks ago in attempt to get in shape.  She stopped 2 weeks ago when her pain got worse, but breath has not improved her symptoms.  Pain is worse with movement and weightbearing.  She has taken ibuprofen without improvement.  The history is provided by the patient.    Past Medical History:  Diagnosis Date  . Anemia   . Anxiety   . Chronic ankle pain   . Chronic back pain   . Chronic headaches   . Chronic knee pain   . Depression   . Headache(784.0)   . Hypertension   . Scoliosis     Patient Active Problem List   Diagnosis Date Noted  . Encounter for contraceptive management 11/24/2015  . Tobacco abuse 03/07/2013  . Thrombocytopenia, unspecified ([place]) 03/05/2013  . Anemia 03/05/2013  . IVDU (intravenous drug user) 03/05/2013    Past Surgical History:  Procedure Laterality Date  . MOUTH SURGERY      OB History    Gravida Para Term Preterm AB Living   2 2 2  0 0 2   SAB TAB Ectopic Multiple Live Births   0 0 0 0 1       Home Medications    Prior to Admission medications   Medication Sig Start Date End Date Taking? Authorizing Provider  acetaminophen (TYLENOL) 500 MG tablet Take 1,000 mg by mouth every 6 (six) hours as needed for moderate pain.    [provider]  Aspirin-Salicylamide-Caffeine (BC HEADACHE) 325-95-16 MG TABS Take 1 packet by mouth daily as needed (for pain).    [provider]   cefdinir (OMNICEF) 300 MG capsule Take 1 capsule (300 mg total) by mouth 2 (two) times daily. 04/26/16   Mayo, Pete Pelt, MD  diazepam (VALIUM) 5 MG tablet Take 1 tablet (5 mg total) by mouth 3 (three) times daily. For muscle strain 08/31/16   Lily Kocher, PA-C  ibuprofen (ADVIL,MOTRIN) 600 MG tablet Take 1 tablet (600 mg total) by mouth 4 (four) times daily. 08/31/16   Lily Kocher, PA-C  lidocaine (LIDODERM) 5 % Place 1 patch onto the skin daily. Remove & Discard patch within 12 hours or as directed by MD 11/17/16   Evalee Jefferson, PA-C  meloxicam (MOBIC) 7.5 MG tablet Take 1 tablet (7.5 mg total) by mouth daily. 11/17/16   Evalee Jefferson, PA-C  oxyCODONE-acetaminophen (PERCOCET/ROXICET) 5-325 MG tablet Take 1 tablet by mouth every 4 (four) hours as needed. 11/17/16   Evalee Jefferson, PA-C    Family History Family History  Problem Relation Age of Onset  . Hypertension Mother   . Hypertension Father   . Diabetes Brother   . Anesthesia problems Neg Hx   . Hypotension Neg Hx   . Malignant hyperthermia Neg Hx   . Pseudochol deficiency Neg Hx     Social History Social History  Substance Use Topics  .  Smoking status: Former Smoker    Packs/day: 1.00    Years: 15.00    Types: Cigarettes    Quit date: 03/10/2016  . Smokeless tobacco: Never Used  . Alcohol use No     Allergies   Hydrocodone and Tramadol   Review of Systems Review of Systems  Constitutional: Negative for fever.  Musculoskeletal: Positive for arthralgias. Negative for joint swelling and myalgias.  Neurological: Negative for weakness and numbness.     Physical Exam Updated Vital Signs BP (!) 147/102 (BP Location: Right Arm)   Pulse 64   Temp 98.2 F (36.8 C) (Oral)   Resp 16   Ht 5\' 5"  (1.651 m)   Wt 59 kg (130 lb)   LMP 11/08/2016   SpO2 100%   BMI 21.63 kg/m   Physical Exam  Constitutional: She appears well-developed and well-nourished.  HENT:  Head: Atraumatic.  Neck: Normal range of motion.    Cardiovascular:  Pulses equal bilaterally  Musculoskeletal: She exhibits tenderness.       Right ankle: Achilles tendon normal.  Patient is tender to palpation along her right distal fibula.  There is a mild thickening at location.  There is no edema, erythema or bruising.  She displays full range of motion of ankles and toes without any worsening pain.  Dorsalis pedis pulses intact.  Neurological: She is alert. She has normal strength. She displays normal reflexes. No sensory deficit.  Skin: Skin is warm and dry.  Psychiatric: She has a normal mood and affect.     ED Treatments / Results  Labs (all labs ordered are listed, but only abnormal results are displayed) Labs Reviewed - No data to display  EKG  EKG Interpretation None       Radiology Dg Ankle Complete Left  Result Date: 11/17/2016 CLINICAL DATA:  Ankle pain EXAM: LEFT ANKLE COMPLETE - 3+ VIEW COMPARISON:  None. FINDINGS: There is no evidence of fracture, dislocation, or joint effusion. There is no evidence of arthropathy or other focal bone abnormality. Soft tissues are unremarkable. IMPRESSION: Negative. Electronically Signed   By: Donavan Foil M.D.   On: 11/17/2016 21:51   Dg Ankle Complete Right  Result Date: 11/17/2016 CLINICAL DATA:  31 year old female with bilateral ankle pain. EXAM: RIGHT ANKLE - COMPLETE 3+ VIEW COMPARISON:  Right ankle radiograph dated 12/01/2011 FINDINGS: There is no acute fracture or dislocation. The bones are well mineralized. The ankle mortise is intact. There is a stable osteochondroma of the distal fibula with no significant interval change in size compared to prior study. There is associated remodeling of the lateral cortex of the distal tibial metadiaphysis. There is no arthritic changes. The soft tissues appear unremarkable. IMPRESSION: 1. No acute fracture or dislocation. 2. No significant interval change in the size of the distal fibular osteochondroma. Electronically Signed   By: Anner Crete M.D.   On: 11/17/2016 21:55    Procedures Procedures (including critical care time)  Medications Ordered in ED Medications  lidocaine (LIDODERM) 5 % 1 patch (1 patch Transdermal Patch Applied 11/17/16 2340)     Initial Impression / Assessment and Plan / ED Course  I have reviewed the triage vital signs and the nursing notes.  Pertinent labs & imaging results that were available during my care of the patient were reviewed by me and considered in my medical decision making (see chart for details).     Acute ankle pain which started in her right ankle, now involving her left.  I suspect  the left pain may be due to favoring the right side.  Imaging reviewed and stable.  She was placed in an ASO of her right ankle to help provide support at this location.  Advised follow-up with her orthopedist, Dr. Luna Glasgow.  Mobic and Lidoderm patch was prescribed.  Also prescribed small quantity of oxycodone.  Ice, elevation, when necessary follow-up..  Final Clinical Impressions(s) / ED Diagnoses   Final diagnoses:  Acute right ankle pain  Acute left ankle pain    New Prescriptions Discharge Medication List as of 11/17/2016 11:18 PM    START taking these medications   Details  lidocaine (LIDODERM) 5 % Place 1 patch onto the skin daily. Remove & Discard patch within 12 hours or as directed by MD, Starting Wed 11/17/2016, Print    meloxicam (MOBIC) 7.5 MG tablet Take 1 tablet (7.5 mg total) by mouth daily., Starting Wed 11/17/2016, Print    oxyCODONE-acetaminophen (PERCOCET/ROXICET) 5-325 MG tablet Take 1 tablet by mouth every 4 (four) hours as needed., Starting Wed 11/17/2016, Print         Evalee Jefferson, PA-C 11/18/16 0032    Nat Christen, MD 11/20/16 [phone]

## 2016-12-08 ENCOUNTER — Encounter: Payer: Self-pay | Admitting: Obstetrics and Gynecology

## 2016-12-08 ENCOUNTER — Ambulatory Visit (INDEPENDENT_AMBULATORY_CARE_PROVIDER_SITE_OTHER): Payer: Medicaid Other | Admitting: Obstetrics and Gynecology

## 2016-12-08 ENCOUNTER — Other Ambulatory Visit (HOSPITAL_COMMUNITY)
Admission: RE | Admit: 2016-12-08 | Discharge: 2016-12-08 | Disposition: A | Discharge status: Home / Self Care | Payer: Self-pay | Source: Ambulatory Visit | Attending: Obstetrics and Gynecology | Admitting: Obstetrics and Gynecology

## 2016-12-08 VITALS — BP 126/72 | HR 72 | Ht 65.5 in | Wt 140.8 lb

## 2016-12-08 DIAGNOSIS — Z3009 Encounter for other general counseling and advice on contraception: Secondary | ICD-10-CM | POA: Insufficient documentation

## 2016-12-08 DIAGNOSIS — I10 Essential (primary) hypertension: Secondary | ICD-10-CM | POA: Insufficient documentation

## 2016-12-08 DIAGNOSIS — F329 Major depressive disorder, single episode, unspecified: Secondary | ICD-10-CM | POA: Insufficient documentation

## 2016-12-08 DIAGNOSIS — Z309 Encounter for contraceptive management, unspecified: Secondary | ICD-10-CM | POA: Diagnosis not present

## 2016-12-08 DIAGNOSIS — R300 Dysuria: Secondary | ICD-10-CM

## 2016-12-08 DIAGNOSIS — F419 Anxiety disorder, unspecified: Secondary | ICD-10-CM | POA: Insufficient documentation

## 2016-12-08 DIAGNOSIS — R319 Hematuria, unspecified: Secondary | ICD-10-CM

## 2016-12-08 DIAGNOSIS — N921 Excessive and frequent menstruation with irregular cycle: Secondary | ICD-10-CM | POA: Insufficient documentation

## 2016-12-08 DIAGNOSIS — N649 Disorder of breast, unspecified: Secondary | ICD-10-CM | POA: Insufficient documentation

## 2016-12-08 DIAGNOSIS — R8290 Unspecified abnormal findings in urine: Secondary | ICD-10-CM

## 2016-12-08 DIAGNOSIS — Z975 Presence of (intrauterine) contraceptive device: Secondary | ICD-10-CM

## 2016-12-08 LAB — POCT URINALYSIS DIPSTICK
Glucose, UA: NEGATIVE
KETONES UA: NEGATIVE
Leukocytes, UA: NEGATIVE
NITRITE UA: NEGATIVE
Protein, UA: NEGATIVE

## 2016-12-08 MED ORDER — MEGESTROL ACETATE 40 MG PO TABS: 40.0000 mg | ORAL_TABLET | Freq: Three times a day (TID) | ORAL | 2 refills | Status: DC

## 2016-12-08 NOTE — Progress Notes (Addendum)
Patient ID: Brianna Hughes, female   DOB: 1986-04-21, 31 y.o.   MRN: [id number]  Assessment:  Annual Gyn Exam BTB on nexplanon R/o uti  8/4= +  Rx macrodantin Plan:  1. pap smear done, next pap due 1 year 2. Megace Rx 40 MG BID WHEN Spotting. 3. HIV and RPR completed today 4. return annually or prn 5  Annual mammogram advised Subjective:  Brianna Hughes is a 31 y.o. female [phone] who presents for annual exam. Patient's last menstrual period was 11/08/2016. The patient has complaints today of family planning and is currently on Nexplanon. Pt reports increased intermittent BTB. Denies any other symptoms.   The following portions of the patient's history were reviewed and updated as appropriate: allergies, current medications, past family history, past medical history, past social history, past surgical history and problem list. Past Medical History:  Diagnosis Date  . Anemia   . Anxiety   . Chronic ankle pain   . Chronic back pain   . Chronic headaches   . Chronic knee pain   . Depression   . Headache(784.0)   . Hypertension   . Scoliosis     Past Surgical History:  Procedure Laterality Date  . MOUTH SURGERY       Current Outpatient Prescriptions:  .  Aspirin-Salicylamide-Caffeine (BC HEADACHE) 325-95-16 MG TABS, Take 1 packet by mouth daily as needed (for pain)., Disp: , Rfl:  .  ibuprofen (ADVIL,MOTRIN) 200 MG tablet, Take 600 mg by mouth as needed., Disp: , Rfl:   Current Facility-Administered Medications:  .  etonogestrel (NEXPLANON) implant 68 mg, 68 mg, Subdermal, Once, Jonnie Kind, MD  Review of Systems Constitutional: negative Gastrointestinal: negative Genitourinary: vaginal bleeding  Objective:  BP 126/72 (BP Location: Right Arm, Patient Position: Sitting, Cuff Size: Normal)   Pulse 72   Ht 5' 5.5" (1.664 m)   Wt 140 lb 12.8 oz (63.9 kg)   LMP 11/08/2016   BMI 23.07 kg/m    BMI: Body mass index is 23.07 kg/m.  General Appearance: Alert,  appropriate appearance for age. No acute distress HEENT: Grossly normal Neck / Thyroid:  Cardiovascular: RRR; normal S1, S2, no murmur Lungs: CTA bilaterally Back: No CVAT Breast Exam: No dimpling, nipple retraction or discharge. No masses or nodes., Normal to inspection, Normal breast tissue bilaterally and No masses or nodes.No dimpling, nipple retraction or discharge. Gastrointestinal: Soft, non-tender, no masses or organomegaly Pelvic Exam: External genitalia: normal general appearance Vaginal: normal mucosa without prolapse or lesions, normal without tenderness, induration or masses, normal rugae and presence of blood Cervix: normal appearance Adnexa: normal bimanual exam Uterus: normal single, nontender and anterior, tiny Rectovaginal: not indicated Lymphatic Exam: Non-palpable nodes in neck, clavicular, axillary, or inguinal regions  Skin: no rash or abnormalities Neurologic: Normal gait and speech, no tremor  Psychiatric: Alert and oriented, appropriate affect.  Urinalysis:trace blood and otherwise negative  Mallory Shirk. MD Pgr [phone] 2:54 PM   By signing my name below, I, Margit Banda, attest that this documentation has been prepared under the direction and in the presence of Jonnie Kind, MD. Electronically Signed: Margit Banda, Medical Scribe. 12/08/16. 2:54 PM.  I personally performed the services described in this documentation, which was SCRIBED in my presence. The recorded information has been reviewed and considered accurate. It has been edited as necessary during review. Jonnie Kind, MD

## 2016-12-09 LAB — RPR: RPR: NONREACTIVE

## 2016-12-09 LAB — HIV ANTIBODY (ROUTINE TESTING W REFLEX): HIV SCREEN 4TH GENERATION: NONREACTIVE

## 2016-12-10 LAB — URINE CULTURE

## 2016-12-11 MED ORDER — NITROFURANTOIN MONOHYD MACRO 100 MG PO CAPS: 100.0000 mg | ORAL_CAPSULE | Freq: Two times a day (BID) | ORAL | 0 refills | Status: DC

## 2016-12-11 NOTE — Addendum Note (Signed)
Addended by: Jonnie Kind on: 12/11/2016 08:02 AM   Modules accepted: Orders

## 2016-12-13 ENCOUNTER — Telehealth: Payer: Self-pay | Admitting: *Deleted

## 2016-12-13 LAB — CYTOLOGY - PAP
Chlamydia: NEGATIVE
Diagnosis: NEGATIVE
HPV (WINDOPATH): NOT DETECTED
Neisseria Gonorrhea: NEGATIVE

## 2016-12-13 NOTE — Telephone Encounter (Signed)
Informed pt of urine culture results and that Dr Glo Herring had prescribed macrobid and that she should take it as prescribed until finished. Pt verbalized understanding.

## 2016-12-13 NOTE — Telephone Encounter (Signed)
Patient called back stating that she is returning a phone call from the nurse regarding her blood test results. Please contact pt

## 2017-02-04 ENCOUNTER — Encounter (HOSPITAL_COMMUNITY): Payer: Self-pay | Admitting: *Deleted

## 2017-02-04 ENCOUNTER — Emergency Department (HOSPITAL_COMMUNITY)
Admission: EM | Admit: 2017-02-04 | Discharge: 2017-02-04 | Disposition: A | Discharge status: Home / Self Care | Payer: Self-pay | Attending: Emergency Medicine | Admitting: Emergency Medicine

## 2017-02-04 DIAGNOSIS — G8929 Other chronic pain: Secondary | ICD-10-CM | POA: Insufficient documentation

## 2017-02-04 DIAGNOSIS — I10 Essential (primary) hypertension: Secondary | ICD-10-CM | POA: Insufficient documentation

## 2017-02-04 DIAGNOSIS — R1032 Left lower quadrant pain: Secondary | ICD-10-CM | POA: Insufficient documentation

## 2017-02-04 DIAGNOSIS — K5900 Constipation, unspecified: Secondary | ICD-10-CM | POA: Insufficient documentation

## 2017-02-04 DIAGNOSIS — Z87891 Personal history of nicotine dependence: Secondary | ICD-10-CM | POA: Insufficient documentation

## 2017-02-04 LAB — COMPREHENSIVE METABOLIC PANEL
ALT: 23 U/L (ref 14–54)
ANION GAP: 6 (ref 5–15)
AST: 20 U/L (ref 15–41)
Albumin: 4.1 g/dL (ref 3.5–5.0)
Alkaline Phosphatase: 60 U/L (ref 38–126)
BILIRUBIN TOTAL: 0.4 mg/dL (ref 0.3–1.2)
BUN: 9 mg/dL (ref 6–20)
CHLORIDE: 105 mmol/L (ref 101–111)
CO2: 24 mmol/L (ref 22–32)
Calcium: 9 mg/dL (ref 8.9–10.3)
Creatinine, Ser: 0.75 mg/dL (ref 0.44–1.00)
Glucose, Bld: 111 mg/dL — ABNORMAL HIGH (ref 65–99)
POTASSIUM: 3.5 mmol/L (ref 3.5–5.1)
Sodium: 135 mmol/L (ref 135–145)
TOTAL PROTEIN: 7.6 g/dL (ref 6.5–8.1)

## 2017-02-04 LAB — CBC
HEMATOCRIT: 37.4 % (ref 36.0–46.0)
HEMOGLOBIN: 13 g/dL (ref 12.0–15.0)
MCH: 30.2 pg (ref 26.0–34.0)
MCHC: 34.8 g/dL (ref 30.0–36.0)
MCV: 86.8 fL (ref 78.0–100.0)
Platelets: 278 10*3/uL (ref 150–400)
RBC: 4.31 MIL/uL (ref 3.87–5.11)
RDW: 12.3 % (ref 11.5–15.5)
WBC: 7.5 10*3/uL (ref 4.0–10.5)

## 2017-02-04 LAB — URINALYSIS, ROUTINE W REFLEX MICROSCOPIC
Bilirubin Urine: NEGATIVE
GLUCOSE, UA: NEGATIVE mg/dL
Hgb urine dipstick: NEGATIVE
Ketones, ur: NEGATIVE mg/dL
LEUKOCYTES UA: NEGATIVE
Nitrite: NEGATIVE
PH: 6 (ref 5.0–8.0)
Protein, ur: NEGATIVE mg/dL
SPECIFIC GRAVITY, URINE: 1.011 (ref 1.005–1.030)

## 2017-02-04 LAB — POC OCCULT BLOOD, ED: FECAL OCCULT BLD: POSITIVE — AB

## 2017-02-04 LAB — LIPASE, BLOOD: Lipase: 24 U/L (ref 11–51)

## 2017-02-04 LAB — POC URINE PREG, ED: Preg Test, Ur: NEGATIVE

## 2017-02-04 NOTE — ED Triage Notes (Signed)
The pt is c/o abd and rectal bleeding  For 2 days.  L;ast bm today  lmp on birth control

## 2017-02-04 NOTE — ED Provider Notes (Signed)
[facility] DEPT Provider Note   CSN: [id number] Arrival date & time: 02/04/17  1649   History   Chief Complaint Chief Complaint  Patient presents with  . Abdominal Pain    HPI Brianna Hughes is a 31 y.o. female.  Patient with PMH of anemia, HTN, depression and chronic pain who presents for evaluation of bright red blood per rectum for the past 2 days. The patient reports a 1 month history of LLQ abdominal cramping which comes in waves and is not associated with specific triggers, such as food intake. Patient reports intermittent constipation at baseline, with bowel movements occurring about once every 2 days. She states she sometimes has to strain to have a bowel movement. Her bowel movements are usually brown in color and sometimes hard. Over the past two days she notes blood on the toilet paper when she wipes and blood mixed on the outside of her stool. She notes some nausea which has worsened over the past month but denies emesis.   She has had cough, congestion, and rhinorrhea over the past month for which she has taken daily motrin and Advil cold and sinus. She states this medication helps but she thinks it may be worsening her abdominal pain. She denies vaginal discharge and has nexplonon for birth control. She has not had break through bleeding on this birth control since >2 months ago.     Past Medical History:  Diagnosis Date  . Anemia   . Anxiety   . Chronic ankle pain   . Chronic back pain   . Chronic headaches   . Chronic knee pain   . Depression   . Headache(784.0)   . Hypertension   . Scoliosis     Patient Active Problem List   Diagnosis Date Noted  . Breakthrough bleeding on Nexplanon 12/08/2016  . Encounter for contraceptive management 11/24/2015  . Tobacco abuse 03/07/2013  . Thrombocytopenia, unspecified ([place]) 03/05/2013  . Anemia 03/05/2013  . IVDU (intravenous drug user) 03/05/2013   Past Surgical History:  Procedure Laterality Date  . MOUTH  SURGERY      OB History    Gravida Para Term Preterm AB Living   2 2 1 1  0 2   SAB TAB Ectopic Multiple Live Births   0 0 0 0 2     Home Medications    Prior to Admission medications   Medication Sig Start Date End Date Taking? Authorizing Provider  Aspirin-Salicylamide-Caffeine (BC HEADACHE) 325-95-16 MG TABS Take 1 packet by mouth daily as needed (for pain).    [provider]  ibuprofen (ADVIL,MOTRIN) 200 MG tablet Take 600 mg by mouth as needed.    [provider]  megestrol (MEGACE) 40 MG tablet Take 1 tablet (40 mg total) by mouth 3 (three) times daily. While bleeding, may stop when bleeding resolves. 12/08/16   Jonnie Kind, MD  nitrofurantoin, macrocrystal-monohydrate, (MACROBID) 100 MG capsule Take 1 capsule (100 mg total) by mouth 2 (two) times daily. For uti 12/11/16   Jonnie Kind, MD    Family History Family History  Problem Relation Age of Onset  . Hypertension Mother   . Hypertension Father   . Diabetes Brother   . Cancer Paternal Grandfather        prostate  . Congestive Heart Failure Maternal Grandfather   . Anesthesia problems Neg Hx   . Hypotension Neg Hx   . Malignant hyperthermia Neg Hx   . Pseudochol deficiency Neg Hx    Social  History Social History  Substance Use Topics  . Smoking status: Former Smoker    Packs/day: 1.00    Years: 15.00    Types: Cigarettes    Quit date: 03/10/2016  . Smokeless tobacco: Never Used  . Alcohol use Yes     Comment: occ    Allergies   Hydrocodone and Tramadol   Review of Systems Review of Systems  Constitutional: Negative for appetite change, chills and fever.  HENT: Positive for congestion and rhinorrhea.   Respiratory: Positive for cough. Negative for shortness of breath.   Cardiovascular: Negative for chest pain, palpitations and leg swelling.  Gastrointestinal: Positive for abdominal pain (LLQ), blood in stool, constipation and nausea. Negative for rectal pain and vomiting.    Genitourinary: Negative for dysuria, flank pain, hematuria, urgency, vaginal bleeding, vaginal discharge and vaginal pain.  Neurological: Negative for light-headedness and headaches.     Physical Exam Updated Vital Signs BP (!) 132/97   Pulse 79   Temp 98.6 F (37 C)   Resp 16   Ht 5' (1.524 m)   Wt 61.2 kg (135 lb)   SpO2 99%   BMI 26.37 kg/m   Physical Exam  Constitutional: She appears well-developed and well-nourished. No distress.  Eyes: Conjunctivae and EOM are normal.  Cardiovascular: Normal rate, regular rhythm and intact distal pulses.  Exam reveals no friction rub.   No murmur heard. Pulmonary/Chest: Effort normal. No respiratory distress. She has no wheezes.  Abdominal: Soft. Bowel sounds are normal. She exhibits no distension and no mass. There is tenderness (lower quadrants (L >R)). There is no rebound and no guarding.  Genitourinary: Rectal exam shows guaiac positive stool.  Genitourinary Comments: No evidence of external hemorrhoids or fissures.   Musculoskeletal: She exhibits no edema (of bilateral lower extremities) or tenderness (of bilateral lower extremities).  Lymphadenopathy:    She has no cervical adenopathy.  Skin: Skin is warm and dry. No rash noted. No erythema.    ED Treatments / Results  Labs (all labs ordered are listed, but only abnormal results are displayed) Labs Reviewed  COMPREHENSIVE METABOLIC PANEL - Abnormal; Notable for the following:       Result Value   Glucose, Bld 111 (*)    All other components within normal limits  POC OCCULT BLOOD, ED - Abnormal; Notable for the following:    Fecal Occult Bld POSITIVE (*)    All other components within normal limits  LIPASE, BLOOD  CBC  URINALYSIS, ROUTINE W REFLEX MICROSCOPIC  POC URINE PREG, ED   EKG  EKG Interpretation None      Radiology No results found.  Procedures Procedures (including critical care time)  Medications Ordered in ED Medications - No data to  display   Initial Impression / Assessment and Plan / ED Course  I have reviewed the triage vital signs and the nursing notes.  Pertinent labs & imaging results that were available during my care of the patient were reviewed by me and considered in my medical decision making (see chart for details).  Patient presents with chronic lower abdominal pain and 2 day history of bright red blood per rectum. She also notes nausea over the past month but denies emesis. Patient reports that her bleeding is painless and first occurred after she had a hard bowel movement. Her physical exam did not reveal external hemorrhoids or anal fissure. She was hemoccult positive. Her abdominal exam revealed tenderness with palpation in lower abdominal quadrants (L>R). Her blood work is reassuring  that she does not have an acute abdominal pathology or urinary tract infection causing her abdominal pain. Her WBCs are within normal limits, suggesting no acute infection/inflammation.   Patient's symptoms are chronic in nature and may be a result of constipation, inflammatory bowel disease, or diverticulosis. Patient also has increased use of NSAIDs over the past month which may be a cause of her GI bleeding. Her chronic cramping mainly in left lower abdominal quadrants may be a result of inflammatory bowel disease, such as ulcerative colitis. This would be consistent with blood in stool as well. Patient will be referred to GI doctor for evaluation.  She denies vaginal bleeding, vaginal discharge, dysuria, and vaginal pain. No concerning symptoms which suggest STI or ovarian torsion. Patient will call and follow up with PCP for management of chronic medical conditions.   Final Clinical Impressions(s) / ED Diagnoses   Final diagnoses:  Left lower quadrant pain  Constipation, unspecified constipation type   Patient stable for discharge. She will be given return precautions and instructions to follow up with PCP. She was given  resources to help establish care in a community clinic once she gets Medicaid card in mail. She was also given the number for [facility]. She was instructed to follow up with this group for evaluation of her chronic abdominal cramping and acute blood in stool.   New Prescriptions Discharge Medication List as of 02/04/2017  8:18 PM       Thomasene Ripple, MD 02/04/17 2052    Deno Etienne, DO 02/04/17 2244

## 2017-02-04 NOTE — ED Notes (Signed)
Pt left at this time with all belongings.  

## 2017-02-04 NOTE — ED Notes (Signed)
MD at bedside. 

## 2017-02-04 NOTE — Discharge Instructions (Signed)
You were evaluated in the emergency room for 1 month of chronic lower abdominal pain and for rectal bleeding. Your blood work was reassuring that you do not have an abdominal infection or urinary tract infection causing your abdominal pain. You were found to have blood in your stool, consistent with reports of blood when you wipe after a bowel movement. Please follow up with GI doctors and/or a primary care physician for evaluation of chronic abdominal pain and blood in your stool. Use the contact information provided on this paperwork to contact GI doctors and to establish care with a primary care provider as an outpatient.

## 2017-06-08 ENCOUNTER — Ambulatory Visit (INDEPENDENT_AMBULATORY_CARE_PROVIDER_SITE_OTHER): Payer: Medicaid Other | Admitting: Obstetrics and Gynecology

## 2017-06-08 ENCOUNTER — Encounter: Payer: Self-pay | Admitting: Obstetrics and Gynecology

## 2017-06-08 DIAGNOSIS — R1032 Left lower quadrant pain: Secondary | ICD-10-CM

## 2017-06-08 DIAGNOSIS — N898 Other specified noninflammatory disorders of vagina: Secondary | ICD-10-CM

## 2017-06-08 DIAGNOSIS — N941 Unspecified dyspareunia: Secondary | ICD-10-CM | POA: Diagnosis not present

## 2017-06-08 NOTE — Progress Notes (Signed)
   [facility] Clinic Visit  @DATE @            Patient name: Brianna Hughes MRN [id number]  Date of birth: 23-Aug-1985  CC & HPI:  Brianna Hughes is a 32 y.o. female presenting today for dyspareunia and llq pain Also has some bleeding on Nexplanon, but is satisfied with Megace tx  ROS:  ROS reduced sex drive x 2 months. + dryness=> discomfort   Pertinent History Reviewed:   Reviewed: Significant for see HPI Medical         Past Medical History:  Diagnosis Date  . Anemia   . Anxiety   . Chronic ankle pain   . Chronic back pain   . Chronic headaches   . Chronic knee pain   . Depression   . Headache(784.0)   . Hypertension   . Scoliosis                               Surgical Hx:    Past Surgical History:  Procedure Laterality Date  . MOUTH SURGERY     Medications: Reviewed & Updated - see associated section                       Current Outpatient Medications:  .  Aspirin-Salicylamide-Caffeine (BC HEADACHE) 325-95-16 MG TABS, Take 1 packet by mouth daily as needed (for pain)., Disp: , Rfl:  .  DULoxetine (CYMBALTA) 60 MG capsule, Take 60 mg by mouth daily., Disp: , Rfl:  .  etonogestrel (NEXPLANON) 68 MG IMPL implant, 1 each by Subdermal route once., Disp: , Rfl:  .  ibuprofen (ADVIL,MOTRIN) 200 MG tablet, Take 600 mg by mouth as needed., Disp: , Rfl:  .  megestrol (MEGACE) 40 MG tablet, Take 1 tablet (40 mg total) by mouth 3 (three) times daily. While bleeding, may stop when bleeding resolves., Disp: 45 tablet, Rfl: 2 .  TRAZODONE HCL PO, Take 50 mg by mouth. Takes 25-100 mg qhs, Disp: , Rfl:    Social History: Reviewed -  reports that she quit smoking about 14 months ago. Her smoking use included cigarettes. She has a 15.00 pack-year smoking history. she has never used smokeless tobacco.  Objective Findings:  Vitals: Blood pressure 126/88, pulse 89, height 5\' 5"  (1.651 m), weight 155 lb (70.3 kg).  PHYSICAL EXAMINATION General appearance - alert, well appearing, and  in no distress, oriented to person, place, and time and normal appearing weight Mental status - alert, oriented to person, place, and time, normal mood, behavior, speech, dress, motor activity, and thought processes Chest - clear to auscultation, no wheezes, rales or rhonchi, symmetric air entry Heart - normal rate and regular rhythm Abdomen - soft, nontender, nondistended, no masses or organomegaly Breasts -  Skin -   PELVIC External genitalia - normal  Vulva - normal Vagina - good secretion normal color Cervix - nontender Uterus - anterior  Adnexa - stool solid in CDS causes the pain. [place] - n/a Rectal - not done    Assessment & Plan:   A:  1.  dyspareunia due to chronic constipation 2.  Vaginal dryness  P:  1.  miralax, incr fiber 2. [place], moisturizers prn

## 2017-06-08 NOTE — Patient Instructions (Signed)
cons Constipation, Adult Constipation is when a person has fewer bowel movements in a week than normal, has difficulty having a bowel movement, or has stools that are dry, hard, or larger than normal. Constipation may be caused by an underlying condition. It may become worse with age if a person takes certain medicines and does not take in enough fluids. Follow these instructions at home: Eating and drinking   Eat foods that have a lot of fiber, such as fresh fruits and vegetables, whole grains, and beans.  Limit foods that are high in fat, low in fiber, or overly processed, such as french fries, hamburgers, cookies, candies, and soda.  Drink enough fluid to keep your urine clear or pale yellow. General instructions  Exercise regularly or as told by your health care provider.  Go to the restroom when you have the urge to go. Do not hold it in.  Take over-the-counter and prescription medicines only as told by your health care provider. These include any fiber supplements.  Practice pelvic floor retraining exercises, such as deep breathing while relaxing the lower abdomen and pelvic floor relaxation during bowel movements.  Watch your condition for any changes.  Keep all follow-up visits as told by your health care provider. This is important. Contact a health care provider if:  You have pain that gets worse.  You have a fever.  You do not have a bowel movement after 4 days.  You vomit.  You are not hungry.  You lose weight.  You are bleeding from the anus.  You have thin, pencil-like stools. Get help right away if:  You have a fever and your symptoms suddenly get worse.  You leak stool or have blood in your stool.  Your abdomen is bloated.  You have severe pain in your abdomen.  You feel dizzy or you faint. This information is not intended to replace advice given to you by your health care provider. Make sure you discuss any questions you have with your health care  provider. Document Released: 01/23/2004 Document Revised: 11/14/2015 Document Reviewed: 10/15/2015 Elsevier Interactive Patient Education  2018 [employer].

## 2017-06-23 ENCOUNTER — Encounter (HOSPITAL_COMMUNITY): Payer: Self-pay | Admitting: Emergency Medicine

## 2017-06-23 ENCOUNTER — Other Ambulatory Visit: Payer: Self-pay

## 2017-06-23 ENCOUNTER — Emergency Department (HOSPITAL_COMMUNITY)
Admission: EM | Admit: 2017-06-23 | Discharge: 2017-06-23 | Disposition: A | Discharge status: Home / Self Care | Payer: Medicaid Other | Attending: Emergency Medicine | Admitting: Emergency Medicine

## 2017-06-23 DIAGNOSIS — R69 Illness, unspecified: Secondary | ICD-10-CM

## 2017-06-23 DIAGNOSIS — J1189 Influenza due to unidentified influenza virus with other manifestations: Secondary | ICD-10-CM | POA: Insufficient documentation

## 2017-06-23 DIAGNOSIS — Z87891 Personal history of nicotine dependence: Secondary | ICD-10-CM | POA: Diagnosis not present

## 2017-06-23 DIAGNOSIS — J111 Influenza due to unidentified influenza virus with other respiratory manifestations: Secondary | ICD-10-CM

## 2017-06-23 DIAGNOSIS — M7918 Myalgia, other site: Secondary | ICD-10-CM | POA: Diagnosis present

## 2017-06-23 MED ORDER — OSELTAMIVIR PHOSPHATE 75 MG PO CAPS: 75.0000 mg | ORAL_CAPSULE | Freq: Two times a day (BID) | ORAL | 0 refills | Status: DC

## 2017-06-23 MED ORDER — IBUPROFEN 600 MG PO TABS: 600.0000 mg | ORAL_TABLET | Freq: Four times a day (QID) | ORAL | 0 refills | Status: DC

## 2017-06-23 MED ORDER — OSELTAMIVIR PHOSPHATE 75 MG PO CAPS
75.0000 mg | ORAL_CAPSULE | Freq: Once | ORAL | Status: AC
  Administered 2017-06-23: 75 mg via ORAL
  Filled 2017-06-23: qty 1

## 2017-06-23 MED ORDER — PROMETHAZINE HCL 12.5 MG PO TABS
12.5000 mg | ORAL_TABLET | Freq: Once | ORAL | Status: AC
  Administered 2017-06-23: 12.5 mg via ORAL
  Filled 2017-06-23: qty 1

## 2017-06-23 MED ORDER — KETOROLAC TROMETHAMINE 60 MG/2ML IM SOLN
60.0000 mg | Freq: Once | INTRAMUSCULAR | Status: AC
  Administered 2017-06-23: 60 mg via INTRAMUSCULAR
  Filled 2017-06-23: qty 2

## 2017-06-23 NOTE — ED Triage Notes (Signed)
PT c/o generalized body aches, bilateral ear pain with sorethroat and chills x1 day. PT states son with similar symptoms this week also. PT states she had Advil sinus and congestion medications this evening around 1600.

## 2017-06-23 NOTE — Discharge Instructions (Signed)
Your symptoms and your examination suggest possible influenza-like illness.  Please wash hands frequently.  Usual mask until symptoms have resolved.  Please increase water, Gatorade, Kool-Aid, juices, etc.  Use ibuprofen with breakfast, lunch, dinner And at bedtime.  Please use Tamiflu 2 times daily with food.  Please see your Medicaid access physicianor return to the emergency department if any changes, problems, or concerns.

## 2017-06-23 NOTE — ED Provider Notes (Signed)
[facility] EMERGENCY DEPARTMENT Provider Note   CSN: [id number] Arrival date & time: 06/23/17  1703     History   Chief Complaint Chief Complaint  Patient presents with  . Generalized Body Aches    HPI Brianna Hughes is a 32 y.o. female.  The history is provided by the patient.  URI   This is a new problem. The current episode started yesterday. The problem has been gradually worsening. The maximum temperature recorded prior to her arrival was 100 to 100.9 F. Associated symptoms include congestion, headaches, sinus pain, sore throat and cough. Pertinent negatives include no chest pain, no abdominal pain, no diarrhea, no vomiting, no dysuria, no sneezing, no neck pain, no rash and no wheezing. Treatments tried: advil cold and flu.    Past Medical History:  Diagnosis Date  . Anemia   . Anxiety   . Chronic ankle pain   . Chronic back pain   . Chronic headaches   . Chronic knee pain   . Depression   . Headache(784.0)   . Hypertension   . Scoliosis     Patient Active Problem List   Diagnosis Date Noted  . Dyspareunia in female 06/08/2017  . Breakthrough bleeding on Nexplanon 12/08/2016  . Encounter for contraceptive management 11/24/2015  . Tobacco abuse 03/07/2013  . Thrombocytopenia, unspecified ([place]) 03/05/2013  . Anemia 03/05/2013  . IVDU (intravenous drug user) 03/05/2013    Past Surgical History:  Procedure Laterality Date  . MOUTH SURGERY      OB History    Gravida Para Term Preterm AB Living   2 2 1 1  0 2   SAB TAB Ectopic Multiple Live Births   0 0 0 0 2       Home Medications    Prior to Admission medications   Medication Sig Start Date End Date Taking? Authorizing Provider  Aspirin-Salicylamide-Caffeine (BC HEADACHE) 325-95-16 MG TABS Take 1 packet by mouth daily as needed (for pain).    [provider]  DULoxetine (CYMBALTA) 60 MG capsule Take 60 mg by mouth daily.    [provider]  etonogestrel (NEXPLANON) 68 MG IMPL  implant 1 each by Subdermal route once.    [provider]  ibuprofen (ADVIL,MOTRIN) 200 MG tablet Take 600 mg by mouth as needed.    [provider]  megestrol (MEGACE) 40 MG tablet Take 1 tablet (40 mg total) by mouth 3 (three) times daily. While bleeding, may stop when bleeding resolves. 12/08/16   Jonnie Kind, MD  TRAZODONE HCL PO Take 50 mg by mouth. Takes 25-100 mg qhs    [provider]    Family History Family History  Problem Relation Age of Onset  . Hypertension Mother   . Hypertension Father   . Diabetes Brother   . Cancer Paternal Grandfather        prostate  . Congestive Heart Failure Maternal Grandfather   . Anesthesia problems Neg Hx   . Hypotension Neg Hx   . Malignant hyperthermia Neg Hx   . Pseudochol deficiency Neg Hx     Social History Social History   Tobacco Use  . Smoking status: Former Smoker    Packs/day: 1.00    Years: 15.00    Pack years: 15.00    Types: Cigarettes    Last attempt to quit: 03/10/2016    Years since quitting: 1.2  . Smokeless tobacco: Never Used  Substance Use Topics  . Alcohol use: Yes    Comment:  very rarely  . Drug use: No    Comment: hx of pain pills-  pt denies     Allergies   Hydrocodone and Tramadol   Review of Systems Review of Systems  Constitutional: Positive for appetite change, chills and fever. Negative for activity change.       All ROS Neg except as noted in HPI  HENT: Positive for congestion, sinus pain and sore throat. Negative for nosebleeds and sneezing.   Eyes: Negative for photophobia and discharge.  Respiratory: Positive for cough. Negative for shortness of breath and wheezing.   Cardiovascular: Negative for chest pain and palpitations.  Gastrointestinal: Negative for abdominal pain, blood in stool, diarrhea and vomiting.  Genitourinary: Negative for dysuria, frequency and hematuria.  Musculoskeletal: Negative for arthralgias, back pain and neck pain.  Skin: Negative.   Negative for rash.  Neurological: Positive for headaches. Negative for dizziness, seizures and speech difficulty.  Psychiatric/Behavioral: Negative for confusion and hallucinations.     Physical Exam Updated Vital Signs BP (!) 133/99 (BP Location: Right Arm)   Pulse (!) 106   Temp 99.6 F (37.6 C) (Oral)   Resp 18   Ht 5\' 5"  (1.651 m)   Wt 70.3 kg (155 lb)   SpO2 96%   BMI 25.79 kg/m   Physical Exam  Constitutional: She is oriented to person, place, and time. She appears well-developed and well-nourished.  Non-toxic appearance.  HENT:  Head: Normocephalic.  Right Ear: Tympanic membrane and external ear normal.  Left Ear: Tympanic membrane and external ear normal.  Nasal congestion Lips dry  Eyes: EOM and lids are normal. Pupils are equal, round, and reactive to light.  Neck: Normal range of motion. Neck supple. Carotid bruit is not present.  Cardiovascular: Regular rhythm, normal heart sounds, intact distal pulses and normal pulses. Tachycardia present.  Pulmonary/Chest: Breath sounds normal. No respiratory distress. She has no wheezes.  Abdominal: Soft. Bowel sounds are normal. There is no tenderness. There is no guarding.  Musculoskeletal: Normal range of motion.  Lymphadenopathy:       Head (right side): No submandibular adenopathy present.       Head (left side): No submandibular adenopathy present.    She has no cervical adenopathy.  Neurological: She is alert and oriented to person, place, and time. She has normal strength. No cranial nerve deficit or sensory deficit.  Skin: Skin is warm and dry.  Psychiatric: She has a normal mood and affect. Her speech is normal.  Nursing note and vitals reviewed.    ED Treatments / Results  Labs (all labs ordered are listed, but only abnormal results are displayed) Labs Reviewed - No data to display  EKG  EKG Interpretation None       Radiology No results found.  Procedures Procedures (including critical care  time)  Medications Ordered in ED Medications - No data to display   Initial Impression / Assessment and Plan / ED Course  I have reviewed the triage vital signs and the nursing notes.  Pertinent labs & imaging results that were available during my care of the patient were reviewed by me and considered in my medical decision making (see chart for details).       Final Clinical Impressions(s) / ED Diagnoses MDM  Vital signs reviewed.  The examination and history suggest influenza-like illness.  It is of note that the patient's son was sick with a viral illness approximately 1 week ago.  I have asked the patient to use Tamiflu 2  times daily, ibuprofen 4 times daily, and to increase fluids.  I also instructed him on the importance of good handwashing.  I provided the patient with a mask to use.  The patient will follow up with the Medicaid access physician, or return to the emergency department if any changes, problems, or concerns.   Final diagnoses:  Influenza-like illness    ED Discharge Orders        Ordered    oseltamivir (TAMIFLU) 75 MG capsule  Every 12 hours     06/23/17 1852    ibuprofen (ADVIL,MOTRIN) 600 MG tablet  4 times daily     06/23/17 1852       Lily Kocher, PA-C 06/23/17 1900    Milton Ferguson, MD 06/23/17 2342

## 2017-09-01 ENCOUNTER — Other Ambulatory Visit: Payer: Self-pay | Admitting: Obstetrics and Gynecology

## 2017-10-21 ENCOUNTER — Ambulatory Visit: Payer: Medicaid Other | Admitting: Adult Health

## 2017-10-21 ENCOUNTER — Encounter: Payer: Self-pay | Admitting: Adult Health

## 2017-10-21 ENCOUNTER — Other Ambulatory Visit: Payer: Self-pay

## 2017-10-21 VITALS — BP 150/102 | HR 84 | Ht 65.0 in | Wt 170.0 lb

## 2017-10-21 DIAGNOSIS — N941 Unspecified dyspareunia: Secondary | ICD-10-CM | POA: Diagnosis not present

## 2017-10-21 DIAGNOSIS — D229 Melanocytic nevi, unspecified: Secondary | ICD-10-CM

## 2017-10-21 DIAGNOSIS — R102 Pelvic and perineal pain: Secondary | ICD-10-CM | POA: Diagnosis not present

## 2017-10-21 DIAGNOSIS — Z3202 Encounter for pregnancy test, result negative: Secondary | ICD-10-CM | POA: Diagnosis not present

## 2017-10-21 DIAGNOSIS — Z975 Presence of (intrauterine) contraceptive device: Secondary | ICD-10-CM | POA: Diagnosis not present

## 2017-10-21 LAB — POCT URINALYSIS DIPSTICK
Blood, UA: NEGATIVE
Glucose, UA: NEGATIVE
Leukocytes, UA: NEGATIVE
Nitrite, UA: NEGATIVE
Protein, UA: NEGATIVE

## 2017-10-21 LAB — POCT URINE PREGNANCY: PREG TEST UR: NEGATIVE

## 2017-10-21 MED ORDER — PHENAZOPYRIDINE HCL 95 MG PO TABS: 95.0000 mg | ORAL_TABLET | Freq: Three times a day (TID) | ORAL | 0 refills | Status: DC | PRN

## 2017-10-21 NOTE — Addendum Note (Signed)
Addended by: Derrek Monaco A on: 10/21/2017 02:07 PM   Modules accepted: Orders

## 2017-10-21 NOTE — Progress Notes (Signed)
  Subjective:     Patient ID: Brianna Hughes, female   DOB: Sep 23, 1985, 32 y.o.   MRN: [id number]  HPI Caryl Pina is a 32 year old white female in complaining of low pelvic pain, pain with sex and sore near bladder area.She has nexplanon.  She has seen Dr Glo Herring in past about the pain with sex, and she said he told her could be endometriosis.   Review of Systems Low pelvic pain Pain with sex Feels sore near bladder  Reviewed past medical,surgical, social and family history. Reviewed medications and allergies.     Objective:   Physical Exam BP (!) 150/102 (BP Location: Right Arm, Patient Position: Sitting, Cuff Size: Normal)   Pulse 84   Ht 5\' 5"  (1.651 m)   Wt 170 lb (77.1 kg)   LMP  (LMP Unknown)   BMI 28.29 kg/m urine dipstick negative, UPT negative.  Skin warm and dry.Pelvic: external genitalia is normal in appearance no lesions, vagina: white discharge without odor,urethra has no lesions or masses noted, cervix:smooth and bulbous, uterus: normal size, shape and contour, + tender, no masses felt, adnexa: no masses or tenderness noted. Bladder is tender and no masses felt.  GC/CHL obtained.  She has 2 moles left inner thigh and one below navel that are of concern,will see if Dr Glo Herring can remove.  Try AZO every day and will get GYN [place], ?IC.     Assessment:     1. Dyspareunia in female   2. Pelvic pain   3. Nexplanon in place   4. Multiple atypical skin moles       Plan:    GC/CHL sent UA C&S sent Return in 2 weeks for GYN [place] Meds ordered this encounter  Medications  . phenazopyridine (AZO URINARY PAIN RELIEF) 95 MG tablet    Sig: Take 1 tablet (95 mg total) by mouth 3 (three) times daily as needed for pain.    Dispense:  10 tablet    Refill:  0    Order Specific Question:   Supervising Provider    Answer:   Tania Ade H [[id number]]

## 2017-10-22 LAB — URINALYSIS, ROUTINE W REFLEX MICROSCOPIC
BILIRUBIN UA: NEGATIVE
Glucose, UA: NEGATIVE
KETONES UA: NEGATIVE
LEUKOCYTES UA: NEGATIVE
Nitrite, UA: NEGATIVE
PROTEIN UA: NEGATIVE
RBC, UA: NEGATIVE
SPEC GRAV UA: 1.021 (ref 1.005–1.030)
Urobilinogen, Ur: 0.2 mg/dL (ref 0.2–1.0)
pH, UA: 6.5 (ref 5.0–7.5)

## 2017-10-23 LAB — URINE CULTURE: Organism ID, Bacteria: NO GROWTH

## 2017-10-25 LAB — GC/CHLAMYDIA PROBE AMP
Chlamydia trachomatis, NAA: NEGATIVE
Neisseria gonorrhoeae by PCR: NEGATIVE

## 2017-11-07 ENCOUNTER — Ambulatory Visit (INDEPENDENT_AMBULATORY_CARE_PROVIDER_SITE_OTHER): Payer: Medicaid Other

## 2017-11-07 DIAGNOSIS — R102 Pelvic and perineal pain: Secondary | ICD-10-CM

## 2017-11-07 DIAGNOSIS — N941 Unspecified dyspareunia: Secondary | ICD-10-CM | POA: Diagnosis not present

## 2017-11-07 NOTE — Progress Notes (Signed)
PELVIC US TA/TV: homogeneous anteverted uterus,wnl,EEC 3.7 mm,normal ovaries bilat,ovaries appear mobile,some left adnexal discomfort during ultrasound,no free fluid

## 2017-11-08 ENCOUNTER — Telehealth: Payer: Self-pay | Admitting: Adult Health

## 2017-11-08 NOTE — Telephone Encounter (Signed)
Left message with man for her to call in am

## 2017-11-09 ENCOUNTER — Telehealth: Payer: Self-pay | Admitting: Adult Health

## 2017-11-09 ENCOUNTER — Telehealth: Payer: Self-pay | Admitting: *Deleted

## 2017-11-09 NOTE — Telephone Encounter (Signed)
Patient informed lab results normal but U/S not available until provider reads.  Will be called when ready.  Verbalized understanding.

## 2017-11-09 NOTE — Telephone Encounter (Signed)
Pt aware that US showed normal uterus and ovaries EEC 3.7 mm, she is also aware GC/CHL negative and urine culture showed no growth, but bladder still tender, to make appt to see Dr Elonda Husky, ?IC.

## 2017-11-17 ENCOUNTER — Ambulatory Visit: Payer: Medicaid Other | Admitting: Obstetrics & Gynecology

## 2017-12-12 ENCOUNTER — Other Ambulatory Visit: Payer: Medicaid Other | Admitting: Obstetrics and Gynecology

## 2017-12-12 ENCOUNTER — Other Ambulatory Visit: Payer: Medicaid Other | Admitting: Adult Health

## 2017-12-22 ENCOUNTER — Encounter: Payer: Self-pay | Admitting: Adult Health

## 2018-01-24 ENCOUNTER — Other Ambulatory Visit: Payer: Medicaid Other | Admitting: Adult Health

## 2018-02-08 ENCOUNTER — Ambulatory Visit (INDEPENDENT_AMBULATORY_CARE_PROVIDER_SITE_OTHER): Payer: Medicaid Other | Admitting: Adult Health

## 2018-02-08 ENCOUNTER — Encounter: Payer: Self-pay | Admitting: Adult Health

## 2018-02-08 VITALS — BP 146/98 | HR 71 | Ht 65.0 in | Wt 176.0 lb

## 2018-02-08 DIAGNOSIS — K5900 Constipation, unspecified: Secondary | ICD-10-CM | POA: Diagnosis not present

## 2018-02-08 DIAGNOSIS — D229 Melanocytic nevi, unspecified: Secondary | ICD-10-CM | POA: Insufficient documentation

## 2018-02-08 DIAGNOSIS — R635 Abnormal weight gain: Secondary | ICD-10-CM | POA: Insufficient documentation

## 2018-02-08 DIAGNOSIS — Z0001 Encounter for general adult medical examination with abnormal findings: Secondary | ICD-10-CM

## 2018-02-08 DIAGNOSIS — Z975 Presence of (intrauterine) contraceptive device: Secondary | ICD-10-CM

## 2018-02-08 DIAGNOSIS — Z01419 Encounter for gynecological examination (general) (routine) without abnormal findings: Secondary | ICD-10-CM | POA: Insufficient documentation

## 2018-02-08 DIAGNOSIS — F329 Major depressive disorder, single episode, unspecified: Secondary | ICD-10-CM | POA: Diagnosis not present

## 2018-02-08 DIAGNOSIS — F32A Depression, unspecified: Secondary | ICD-10-CM | POA: Insufficient documentation

## 2018-02-08 MED ORDER — LINACLOTIDE 72 MCG PO CAPS: 72.0000 ug | ORAL_CAPSULE | Freq: Every day | ORAL | 0 refills | Status: DC

## 2018-02-08 NOTE — Progress Notes (Signed)
Patient ID: [name], female   DOB: Sep 14, 1985, 32 y.o.   MRN: [id number] History of Present Illness: Brianna Hughes is a 32 year old white female in for well woman gyn exam, she had a normal  Pap wotu negative HPV 12/08/16. PCP is Dr Quillian Quince.    Current Medications, Allergies, Past Medical History, Past Surgical History, Family History and Social History were reviewed in [employer] record.     Review of Systems: Patient denies any headaches, hearing loss, fatigue, blurred vision, shortness of breath, chest pain, abdominal pain, problems with  urination, or intercourse. No joint pain or mood swings. +body aches, has ?RA, +constipation  Pain in left side at times  Has gained about 40 lbs in last year    Physical Exam:BP (!) 146/98 (BP Location: Right Arm, Patient Position: Sitting, Cuff Size: Normal)   Pulse 71   Ht 5\' 5"  (1.651 m)   Wt 176 lb (79.8 kg)   LMP 02/05/2018   BMI 29.29 kg/m  General:  Well developed, well nourished, no acute distress Skin:  Warm and dry Neck:  Midline trachea, normal thyroid, good ROM, no lymphadenopathy Lungs; Clear to auscultation bilaterally Breast:  No dominant palpable mass, retraction, or nipple discharge Cardiovascular: Regular rate and rhythm Abdomen:  Soft, non tender, no hepatosplenomegaly Pelvic:  External genitalia is normal in appearance, no lesions.  The vagina is normal in appearance. Urethra has no lesions or masses. The cervix is bulbous.  Uterus is felt to be normal size, shape, and contour.  No adnexal masses, LLQ tenderness noted.Bladder is non tender, no masses felt,2 moles left inner thigh,brown and about 2 cm in diameter, and one on right foot  Extremities/musculoskeletal:  No swelling or varicosities noted, no clubbing or cyanosis Psych:  No mood changes, alert and cooperative,seems happy PHQ 9 score 15, is on meds and denies being suicidal.Does not like cymbalta, talk with PCP. Examination chaperoned by Estill Bamberg  Rash LPN. Will try linzess   Impression: 1. Encounter for well woman exam with routine gynecological exam   2. Constipation, unspecified constipation type   3. Depression, unspecified depression type   4. Nexplanon in place   5. Weight gain   6. Numerous moles       Plan: Meds ordered this encounter  Medications  . linaclotide (LINZESS) 72 MCG capsule    Sig: Take 1 capsule (72 mcg total) by mouth daily before breakfast.    Dispense:  36 capsule    Refill:  0    Order Specific Question:   Supervising Provider    Answer:   Tania Ade H [[id number]]  F/U in 3 weeks  Physical in 1 year Pap in 2021 Make app with Dr Glo Herring for mole removal  Labs with PCP

## 2018-03-01 ENCOUNTER — Ambulatory Visit: Payer: Medicaid Other | Admitting: Adult Health

## 2018-05-01 ENCOUNTER — Encounter: Payer: Self-pay | Admitting: Women's Health

## 2018-05-01 ENCOUNTER — Ambulatory Visit (INDEPENDENT_AMBULATORY_CARE_PROVIDER_SITE_OTHER): Payer: Medicaid Other | Admitting: Women's Health

## 2018-05-01 VITALS — BP 137/92 | HR 83 | Ht 65.0 in | Wt 171.5 lb

## 2018-05-01 DIAGNOSIS — N898 Other specified noninflammatory disorders of vagina: Secondary | ICD-10-CM | POA: Diagnosis not present

## 2018-05-01 DIAGNOSIS — N760 Acute vaginitis: Secondary | ICD-10-CM | POA: Diagnosis not present

## 2018-05-01 DIAGNOSIS — B9689 Other specified bacterial agents as the cause of diseases classified elsewhere: Secondary | ICD-10-CM

## 2018-05-01 LAB — POCT WET PREP (WET MOUNT)
CLUE CELLS WET PREP WHIFF POC: POSITIVE
Trichomonas Wet Prep HPF POC: ABSENT

## 2018-05-01 MED ORDER — METRONIDAZOLE 500 MG PO TABS: 500.0000 mg | ORAL_TABLET | Freq: Two times a day (BID) | ORAL | 0 refills | Status: DC

## 2018-05-01 NOTE — Progress Notes (Addendum)
   GYN VISIT Patient name: Brianna Hughes MRN [id number]  Date of birth: [birth year] Chief Complaint:   possible yeast infection  History of Present Illness:   Brianna Hughes is a 32 y.o. G74P1102 Caucasian female being seen today for report of vaginal d/c w/ odor and itching. Used otc yeast cream which seemed to help. BP up, states it is always normal at her PCP, goes back there next month. Wants to have Nexplanon removed to get pregnant.      No LMP recorded. Patient has had an implant. The current method of family planning is nexplanon. Last pap 12/08/16. Results were:  neg w/ -HRHPV Review of Systems:   Pertinent items are noted in HPI Denies fever/chills, dizziness, headaches, visual disturbances, fatigue, shortness of breath, chest pain, abdominal pain, vomiting, abnormal vaginal discharge/itching/odor/irritation, problems with periods, bowel movements, urination, or intercourse unless otherwise stated above.  Pertinent History Reviewed:  Reviewed past medical,surgical, social, obstetrical and family history.  Reviewed problem list, medications and allergies. Physical Assessment:   Vitals:   05/01/18 1409  BP: (!) 137/92  Pulse: 83  Weight: 171 lb 8 oz (77.8 kg)  Height: 5\' 5"  (1.651 m)  Body mass index is 28.54 kg/m.       Physical Examination:   General appearance: alert, well appearing, and in no distress  Mental status: alert, oriented to person, place, and time  Skin: warm & dry   Cardiovascular: normal heart rate noted  Respiratory: normal respiratory effort, no distress  Abdomen: soft, non-tender   Pelvic: VULVA: normal appearing vulva with no masses, tenderness or lesions, VAGINA: normal appearing vagina with normal color and small amt malodorous discharge, no lesions, CERVIX: normal appearing cervix without discharge or lesions  Extremities: no edema   Results for orders placed or performed in visit on 05/01/18 (from the past 24 hour(s))  POCT Wet Prep [facility])   Collection Time: 05/01/18  2:50 PM  Result Value Ref Range   Source Wet Prep POC vaginal    WBC, Wet Prep HPF POC few    Bacteria Wet Prep HPF POC Few Few   BACTERIA WET PREP MORPHOLOGY POC     Clue Cells Wet Prep HPF POC Moderate (A) None   Clue Cells Wet Prep Whiff POC Positive Whiff    Yeast Wet Prep HPF POC None None   KOH Wet Prep POC     Trichomonas Wet Prep HPF POC Absent Absent    Assessment & Plan:  1) BV> Rx metronidazole 500mg  BID x 7d for BV, no sex or etoh while taking   2) Desires pregnancy> make appt for nexplanon removal  3) Elevated bp> to check bp at home, take readings to PCP next month  Meds:  Meds ordered this encounter  Medications  . metroNIDAZOLE (FLAGYL) 500 MG tablet    Sig: Take 1 tablet (500 mg total) by mouth 2 (two) times daily.    Dispense:  14 tablet    Refill:  0    Order Specific Question:   Supervising Provider    Answer:   Tania Ade H [[id number]]    Orders Placed This Encounter  Procedures  . GC/Chlamydia Probe Amp  . POCT Wet Prep [facility])    Return for make appt for nexplanon removal.  Roma Schanz CNM, [facility] 05/01/2018 2:50 PM

## 2018-05-06 LAB — GC/CHLAMYDIA PROBE AMP
Chlamydia trachomatis, NAA: NEGATIVE
Neisseria gonorrhoeae by PCR: NEGATIVE

## 2018-05-18 ENCOUNTER — Encounter: Payer: Medicaid Other | Admitting: Women's Health

## 2018-05-30 ENCOUNTER — Encounter: Payer: Self-pay | Admitting: Women's Health

## 2018-05-30 ENCOUNTER — Ambulatory Visit: Payer: Medicaid Other | Admitting: Women's Health

## 2018-05-30 VITALS — BP 128/96 | HR 86 | Ht 65.0 in | Wt 174.0 lb

## 2018-05-30 DIAGNOSIS — Z3049 Encounter for surveillance of other contraceptives: Secondary | ICD-10-CM

## 2018-05-30 DIAGNOSIS — I10 Essential (primary) hypertension: Secondary | ICD-10-CM | POA: Insufficient documentation

## 2018-05-30 DIAGNOSIS — O10919 Unspecified pre-existing hypertension complicating pregnancy, unspecified trimester: Secondary | ICD-10-CM | POA: Insufficient documentation

## 2018-05-30 DIAGNOSIS — Z3046 Encounter for surveillance of implantable subdermal contraceptive: Secondary | ICD-10-CM

## 2018-05-30 NOTE — Progress Notes (Signed)
   [place] REMOVAL Patient name: Brianna Hughes MRN [id number]  Date of birth: March 20, 1986 Subjective Findings:   Brianna Hughes is a 33 y.o. G28P1102 Caucasian female being seen today for removal of a Nexplanon. Her Nexplanon was placed 12/10/15.  She desires removal because she wants to get pregnant. Signed copy of informed consent in chart.  BP elevated again today. States she saw PCP this month, was normal there. Is not checking bp's at home like we discussed last visit, her dad has her bp cuff. Strong family h/o HTN, pt had GHTN last pregnancy. Is not taking pnv yet. Denies etoh/illicit drug use. Is cutting back on smoking, was 1ppd, now only 1/2 pack since yesterday.   No LMP recorded. Patient has had an implant. Last pap 12/08/16. Results were:  neg w/ -HRHPV The planned method of family planning is none Pertinent History Reviewed:   Reviewed past medical,surgical, social, obstetrical and family history.  Reviewed problem list, medications and allergies. Objective Findings & Procedure:    Vitals:   05/30/18 1434 05/30/18 1508  BP: (!) 143/97 (!) 128/96  Pulse: 86   Weight: 174 lb (78.9 kg)   Height: 5\' 5"  (1.651 m)   Body mass index is 28.96 kg/m.  No results found for this or any previous visit (from the past 24 hour(s)).   Time out was performed.  Nexplanon site identified.  Area prepped in usual sterile fashon. One cc of 2% lidocaine was used to anesthetize the area at the distal end of the implant. A small stab incision was made right beside the implant on the distal portion.  The Nexplanon rod was grasped using hemostats and removed without difficulty.  There was less than 3 cc blood loss. There were no complications.  Steri-strips were applied over the small incision and a pressure bandage was applied.  The patient tolerated the procedure well. Assessment & Plan:   1) Nexplanon removal She was instructed to keep the area clean and dry, remove pressure bandage in 24 hours, and  keep insertion site covered with the steri-strip for 3-5 days.   Follow-up PRN problems.  2) Desires pregnancy> begin pnv, call us w/ +PT  3) HTN> check bp's QID at home, write in log, take to PCP unless becomes pregnant sooner  No orders of the defined types were placed in this encounter.   Follow-up: Return for after 10/2 for , Physical.  Roma Schanz CNM, [facility] 05/30/2018 3:13 PM

## 2018-05-30 NOTE — Patient Instructions (Signed)
Start taking prenatal vitamins daily, let us know when you get a positive pregnancy test

## 2018-06-19 ENCOUNTER — Ambulatory Visit: Payer: Medicaid Other | Admitting: Women's Health

## 2018-06-23 ENCOUNTER — Ambulatory Visit: Payer: Medicaid Other | Admitting: Advanced Practice Midwife

## 2018-08-30 ENCOUNTER — Encounter: Payer: Self-pay | Admitting: *Deleted

## 2018-08-31 ENCOUNTER — Ambulatory Visit: Payer: Medicaid Other | Admitting: Adult Health

## 2018-09-08 ENCOUNTER — Ambulatory Visit (INDEPENDENT_AMBULATORY_CARE_PROVIDER_SITE_OTHER): Payer: Medicaid Other | Admitting: Adult Health

## 2018-09-08 ENCOUNTER — Encounter: Payer: Self-pay | Admitting: Adult Health

## 2018-09-08 ENCOUNTER — Other Ambulatory Visit: Payer: Self-pay

## 2018-09-08 DIAGNOSIS — Z319 Encounter for procreative management, unspecified: Secondary | ICD-10-CM | POA: Insufficient documentation

## 2018-09-08 DIAGNOSIS — R110 Nausea: Secondary | ICD-10-CM | POA: Diagnosis not present

## 2018-09-08 DIAGNOSIS — Z3202 Encounter for pregnancy test, result negative: Secondary | ICD-10-CM

## 2018-09-08 DIAGNOSIS — R102 Pelvic and perineal pain: Secondary | ICD-10-CM | POA: Diagnosis not present

## 2018-09-08 NOTE — Progress Notes (Signed)
Patient ID: Brianna Hughes, female   DOB: 05-08-1986, 33 y.o.   MRN: [id number]   TELEHEALTH VIRTUAL GYNECOLOGY VISIT ENCOUNTER NOTE  I connected with Brianna Hughes on 09/08/18 at 10:00 AM EDT by telephone at home and verified that I am speaking with the correct person using two identifiers.   I discussed the limitations, risks, security and privacy concerns of performing an evaluation and management service by telephone and the availability of in person appointments. I also discussed with the patient that there may be a patient responsible charge related to this service. The patient expressed understanding and agreed to proceed.   History:  Brianna Hughes is a 33 y.o. [phone] female being evaluated today for feeling like she is pregnant, has nausea, and some cramping, has not missed a period and had negative HPT.She had nexplanon removed 05/30/2018, and has been trying to get pregnant since then.She says she has gained about 10 lbs too.  Discussed that could take 6-18 months of active trying. She denies any abnormal vaginal discharge, bleeding, or other concerns.   Will get [facility]. PCP is Dr Quillian Quince.       Past Medical History:  Diagnosis Date  . Anemia   . Anxiety   . Chronic ankle pain   . Chronic back pain   . Chronic headaches   . Chronic knee pain   . Depression    bipolar  . Fibromyalgia   . Headache(784.0)   . Hypertension   . Rheumatoid arthritis ([place])   . Scoliosis    Past Surgical History:  Procedure Laterality Date  . MOUTH SURGERY     The following portions of the patient's history were reviewed and updated as appropriate: allergies, current medications, past family history, past medical history, past social history, past surgical history and problem list.   Health Maintenance:  Normal pap and negative HRHPV on 12/08/16.  Review of Systems:  Pertinent items noted in HPI and remainder of comprehensive ROS otherwise negative.  Physical Exam:   General:  Alert, oriented  and cooperative.   Mental Status: Normal mood and affect perceived. Normal judgment and thought content.  Physical exam deferred due to nature of the encounter LMP 08/21/2018 per pt. Fall risk is low. PHQ 2 score 1.   Labs and Imaging No results found for this or any previous visit (from the past 336 hour(s)). No results found.    Assessment and Plan:     1. Negative pregnancy test - Beta hCG quant (ref lab)  2. Patient desires pregnancy -take OTC PNV - Beta hCG quant (ref lab)  3. Nausea - Beta hCG quant (ref lab)  4. Pelvic cramping - Beta hCG quant (ref lab)       I discussed the assessment and treatment plan with the patient. The patient was provided an opportunity to ask questions and all were answered. The patient agreed with the plan and demonstrated an understanding of the instructions.   The patient was advised to call back or seek an in-person evaluation/go to the ED if the symptoms worsen or if the condition fails to improve as anticipated.  I provided 5 minutes of non-face-to-face time during this encounter.   Derrek Monaco, NP [facility], [place]

## 2018-10-07 ENCOUNTER — Other Ambulatory Visit: Payer: Self-pay | Admitting: Obstetrics and Gynecology

## 2018-10-10 ENCOUNTER — Encounter: Payer: Self-pay | Admitting: *Deleted

## 2018-10-10 ENCOUNTER — Ambulatory Visit: Payer: Medicaid Other | Admitting: Adult Health

## 2018-10-11 ENCOUNTER — Encounter: Payer: Self-pay | Admitting: Adult Health

## 2018-10-11 ENCOUNTER — Other Ambulatory Visit: Payer: Self-pay

## 2018-10-11 ENCOUNTER — Ambulatory Visit (INDEPENDENT_AMBULATORY_CARE_PROVIDER_SITE_OTHER): Payer: Medicaid Other | Admitting: Adult Health

## 2018-10-11 VITALS — BP 127/90 | HR 75 | Ht 66.0 in | Wt 190.0 lb

## 2018-10-11 DIAGNOSIS — R102 Pelvic and perineal pain: Secondary | ICD-10-CM | POA: Diagnosis not present

## 2018-10-11 DIAGNOSIS — N939 Abnormal uterine and vaginal bleeding, unspecified: Secondary | ICD-10-CM

## 2018-10-11 DIAGNOSIS — R635 Abnormal weight gain: Secondary | ICD-10-CM

## 2018-10-11 DIAGNOSIS — Z131 Encounter for screening for diabetes mellitus: Secondary | ICD-10-CM | POA: Diagnosis not present

## 2018-10-11 NOTE — Progress Notes (Addendum)
Patient ID: Brianna Hughes, female   DOB: October 24, 1985, 33 y.o.   MRN: [id number] History of Present Illness: Brianna Hughes is a 33 year old white female, G2P1102, in complaining of bleeding since 09/11/18 on and off, can be light or heavy at times, no bleeding today, and she has had pelvic pain and cramps, more on right side, and she is gaining weight, and has seen Dr Quillian Quince and had labs drawn and thyroid was normal she says.  PCP is Dr Quillian Quince.    Current Medications, Allergies, Past Medical History, Past Surgical History, Family History and Social History were reviewed in [employer] record.     Review of Systems: Bleeding on and off for a month, sometimes light then may be heavy +Pelvic pain, R>L +weight gain +pain sex at times, esp if constipated    Physical Exam:BP 127/90 (BP Location: Right Arm, Patient Position: Sitting, Cuff Size: Normal)   Pulse 75   Ht 5\' 6"  (1.676 m)   Wt 190 lb (86.2 kg)   LMP 09/11/2018   BMI 30.67 kg/m  UPT is negative, has gained about 16 lbs since January.  General:  Well developed, well nourished, no acute distress Skin:  Warm and dry Pelvic:  External genitalia is normal in appearance, no lesions.  The vagina is normal in appearance, no blood seen today. Urethra has no lesions or masses. The cervix is bulbous.No CMT.  Uterus is felt to be normal size, shape, and contour.  No adnexal masses, + tenderness noted.Bladder is non tender, no masses felt. Psych:  No mood changes, alert and cooperative,seems happy Examination chaperoned by Diona Fanti CMA.   Impression: 1. Pelvic pain   2. Abnormal uterine bleeding (AUB)   3. Weight gain   4. Screening for diabetes mellitus       Plan: Will check CBC, CMP and A1c today  Will get GYN [place] in about a week And will talk when results back.  Review handouts on pelvic pain and DUB Nusw[place] was obtained

## 2018-10-11 NOTE — Patient Instructions (Signed)
Abnormal Uterine Bleeding Abnormal uterine bleeding means bleeding more than usual from your uterus. It can include:  Bleeding between periods.  Bleeding after sex.  Bleeding that is heavier than normal.  Periods that last longer than usual.  Bleeding after you have stopped having your period (menopause). There are many problems that may cause this. You should see a doctor for any kind of bleeding that is not normal. Treatment depends on the cause of the bleeding. Follow these instructions at home:  Watch your condition for any changes.  Do not use tampons, douche, or have sex, if your doctor tells you not to.  Change your pads often.  Get regular well-woman exams. Make sure they include a pelvic exam and cervical cancer screening.  Keep all follow-up visits as told by your doctor. This is important. Contact a doctor if:  The bleeding lasts more than one week.  You feel dizzy at times.  You feel like you are going to throw up (nauseous).  You throw up. Get help right away if:  You pass out.  You have to change pads every hour.  You have belly (abdominal) pain.  You have a fever.  You get sweaty.  You get weak.  You passing large blood clots from your vagina. Summary  Abnormal uterine bleeding means bleeding more than usual from your uterus.  There are many problems that may cause this. You should see a doctor for any kind of bleeding that is not normal.  Treatment depends on the cause of the bleeding. This information is not intended to replace advice given to you by your health care provider. Make sure you discuss any questions you have with your health care provider. Document Released: 02/21/2009 Document Revised: 04/20/2016 Document Reviewed: 04/20/2016 Elsevier Interactive Patient Education  2019 [place]. Pelvic Pain, Female Pelvic pain is pain in your lower abdomen, below your belly button and between your hips. The pain may start suddenly (be  acute), keep coming back (be recurring), or last a long time (become chronic). Pelvic pain that lasts longer than 6 months is considered chronic. Pelvic pain may affect your:  Reproductive organs.  Urinary system.  Digestive tract.  Musculoskeletal system. There are many potential causes of pelvic pain. Sometimes, the pain can be a result of digestive or urinary conditions, strained muscles or ligaments, or reproductive conditions. Sometimes the cause of pelvic pain is not known. Follow these instructions at home:   Take over-the-counter and prescription medicines only as told by your health care provider.  Rest as told by your health care provider.  Do not have sex if it hurts.  Keep a journal of your pelvic pain. Write down: ? When the pain started. ? Where the pain is located. ? What seems to make the pain better or worse, such as food or your period (menstrual cycle). ? Any symptoms you have along with the pain.  Keep all follow-up visits as told by your health care provider. This is important. Contact a health care provider if:  Medicine does not help your pain.  Your pain comes back.  You have new symptoms.  You have abnormal vaginal discharge or bleeding, including bleeding after menopause.  You have a fever or chills.  You are constipated.  You have blood in your urine or stool.  You have foul-smelling urine.  You feel weak or light-headed. Get help right away if:  You have sudden severe pain.  Your pain gets steadily worse.  You have severe  pain along with fever, nausea, vomiting, or excessive sweating.  You lose consciousness. Summary  Pelvic pain is pain in your lower abdomen, below your belly button and between your hips.  There are many potential causes of pelvic pain.  Keep a journal of your pelvic pain. This information is not intended to replace advice given to you by your health care provider. Make sure you discuss any questions you have  with your health care provider. Document Released: 03/23/2004 Document Revised: 10/12/2017 Document Reviewed: 10/12/2017 Elsevier Interactive Patient Education  2019 [employer].

## 2018-10-12 LAB — COMPREHENSIVE METABOLIC PANEL
ALT: 23 IU/L (ref 0–32)
AST: 19 IU/L (ref 0–40)
Albumin/Globulin Ratio: 1.5 (ref 1.2–2.2)
Albumin: 4.1 g/dL (ref 3.8–4.8)
Alkaline Phosphatase: 89 IU/L (ref 39–117)
BUN/Creatinine Ratio: 20 (ref 9–23)
BUN: 16 mg/dL (ref 6–20)
Bilirubin Total: 0.3 mg/dL (ref 0.0–1.2)
CO2: 21 mmol/L (ref 20–29)
Calcium: 9 mg/dL (ref 8.7–10.2)
Chloride: 103 mmol/L (ref 96–106)
Creatinine, Ser: 0.79 mg/dL (ref 0.57–1.00)
GFR calc Af Amer: 115 mL/min/{1.73_m2} (ref >59)
GFR calc non Af Amer: 100 mL/min/{1.73_m2} (ref >59)
Globulin, Total: 2.7 g/dL (ref 1.5–4.5)
Glucose: 102 mg/dL — ABNORMAL HIGH (ref 65–99)
Potassium: 4.4 mmol/L (ref 3.5–5.2)
Sodium: 140 mmol/L (ref 134–144)
Total Protein: 6.8 g/dL (ref 6.0–8.5)

## 2018-10-12 LAB — HEMOGLOBIN A1C
Est. average glucose Bld gHb Est-mCnc: 105 mg/dL
Hgb A1c MFr Bld: 5.3 % (ref 4.8–5.6)

## 2018-10-12 LAB — CBC
Hematocrit: 43.4 % (ref 34.0–46.6)
Hemoglobin: 14.8 g/dL (ref 11.1–15.9)
MCH: 31.2 pg (ref 26.6–33.0)
MCHC: 34.1 g/dL (ref 31.5–35.7)
MCV: 91 fL (ref 79–97)
Platelets: 255 10*3/uL (ref 150–450)
RBC: 4.75 x10E6/uL (ref 3.77–5.28)
RDW: 13.1 % (ref 11.7–15.4)
WBC: 10.7 10*3/uL (ref 3.4–10.8)

## 2018-10-19 LAB — NUSWAB VAGINITIS PLUS (VG+)
Atopobium vaginae: HIGH Score — AB
Candida albicans, NAA: NEGATIVE
Candida glabrata, NAA: NEGATIVE
Chlamydia trachomatis, NAA: NEGATIVE
Neisseria gonorrhoeae, NAA: NEGATIVE
Trich vag by NAA: NEGATIVE

## 2018-10-20 ENCOUNTER — Other Ambulatory Visit: Payer: Medicaid Other

## 2018-12-21 ENCOUNTER — Other Ambulatory Visit: Payer: Self-pay

## 2018-12-21 ENCOUNTER — Other Ambulatory Visit: Payer: Self-pay | Admitting: Obstetrics and Gynecology

## 2018-12-21 ENCOUNTER — Ambulatory Visit (INDEPENDENT_AMBULATORY_CARE_PROVIDER_SITE_OTHER): Payer: Medicaid Other | Admitting: Obstetrics and Gynecology

## 2018-12-21 ENCOUNTER — Encounter: Payer: Self-pay | Admitting: Obstetrics and Gynecology

## 2018-12-21 VITALS — BP 127/93 | HR 73 | Ht 66.0 in | Wt 191.0 lb

## 2018-12-21 DIAGNOSIS — D2272 Melanocytic nevi of left lower limb, including hip: Secondary | ICD-10-CM | POA: Diagnosis not present

## 2018-12-21 DIAGNOSIS — L819 Disorder of pigmentation, unspecified: Secondary | ICD-10-CM

## 2018-12-21 NOTE — Progress Notes (Signed)
Patient ID: Brianna Hughes, female   DOB: 07/20/1985, 32 y.o.   MRN: [id number]  MOLE REMOVAL PROCEDURE NOTE (3) The patient's identification was confirmed and consent was obtained. This procedure was performed by Jonnie Kind at 3:55 PM   Site & Appearance: one pigmented subumbilical mole, two moles on left inner thigh, each about 1.5cm. Sterile procedures observed. Anesthetic used: 1% Lidocaine without epinephrine  Subumbilical nevus: Single layer closure with subcuticular 4-ovicryl Both inner thigh lesions: Two layer closure with 3-o proline, after sub q 4-0 vicryl Full thickness skin excision.  Moles excised under local anesthesia, site covered with dry, sterile dressing and Steri-strips. Patient tolerated the procedure well without complications. Minimal blood loss 10cc. Instructions for care discussed verbally and the pt was advised to apply Neosporin and cover the areas with band-aids for 2-3 weeks.  By signing my name below, I, De Burrs, attest that this documentation has been prepared under the direction and in the presence of Jonnie Kind, MD. Electronically Signed: De Burrs, Medical Scribe. 12/21/18. 3:55 PM.  I personally performed the services described in this documentation, which was SCRIBED in my presence. The recorded information has been reviewed and considered accurate. It has been edited as necessary during review. Jonnie Kind, MD

## 2018-12-21 NOTE — Progress Notes (Signed)
Patient ID: Brianna Hughes, female   DOB: 10-19-1985, 32 y.o.   MRN: [id number]  MOLE REMOVAL PROCEDURE NOTE (3) The patient's identification was confirmed and consent was obtained. This procedure was performed by Jonnie Kind at 3:55 PM   Site & Appearance: one pigmented subumbilical mole, two moles on left inner thigh, each about 1.5cm. Sterile procedures observed. Anesthetic used: 1% Lidocaine without epinephrine  Subumbilical nevus: Single layer closure with subcuticular 4-ovicryl Both inner thigh lesions: Two layer closure with 3-o proline, after sub q 4-0 vicryl Full thickness skin excision.  Moles excised under local anesthesia, site covered with dry, sterile dressing and Steri-strips. Patient tolerated the procedure well without complications. Minimal blood loss 10cc. Instructions for care discussed verbally and the pt was advised to apply Neosporin and cover the areas with band-aids for 2-3 weeks.  By signing my name below, I, De Burrs, attest that this documentation has been prepared under the direction and in the presence of Jonnie Kind, MD. Electronically S  igned: De Burrs, Medical Scribe. 12/21/18. 3:55 PM.  I personally performed the services described in this documentation, which was SCRIBED in my presence. The recorded information has been reviewed and considered accurate. It has been edited as necessary during review. Jonnie Kind, MD

## 2019-01-03 ENCOUNTER — Other Ambulatory Visit: Payer: Self-pay

## 2019-01-03 ENCOUNTER — Ambulatory Visit (INDEPENDENT_AMBULATORY_CARE_PROVIDER_SITE_OTHER): Payer: Medicaid Other | Admitting: Obstetrics and Gynecology

## 2019-01-03 ENCOUNTER — Encounter: Payer: Self-pay | Admitting: Obstetrics and Gynecology

## 2019-01-03 VITALS — BP 133/97 | HR 90 | Ht 66.0 in | Wt 193.0 lb

## 2019-01-03 DIAGNOSIS — Z4802 Encounter for removal of sutures: Secondary | ICD-10-CM

## 2019-01-03 NOTE — Progress Notes (Signed)
Patient ID: Brianna Hughes, female   DOB: [birth year], 33 y.o.   MRN: [id number]   [facility] Visit  @DATE @            Patient name: Brianna Hughes MRN [id number]  Date of birth: 29-Jun-1985  CC & HPI:  Brianna Hughes is a 33 y.o. female presenting today for stitch removal from left inner thigh. Stiches on abdomen is doing well and healing adequately. Has some skin tags on her back that are cosmetically bothersome but of no serious concern.  ROS:  ROS   Pathology report +pigmented nevi (abdomen and thigh)1. Skin , umbilical DYSPLASTIC COMPOUND NEVUS WITH MODERATE ATYPIA, LIMITED MARGINS FREE 2. Skin , left inner thigh upper MELANOCYTIC NEVUS, COMPOUND TYPE, IRRITATED 3. Skin , left inner thigh lower MELANOCYTIC NEVUS, COMPOUND TYPE, IRRITATED   Pertinent History Reviewed:   Reviewed:  Medical         Past Medical History:  Diagnosis Date   Anemia    Anxiety    Chronic ankle pain    Chronic back pain    Chronic headaches    Chronic knee pain    Depression    bipolar   Fibromyalgia    Headache(784.0)    Hypertension    Rheumatoid arthritis ([place])    Scoliosis                               Surgical Hx:    Past Surgical History:  Procedure Laterality Date   MOUTH SURGERY     Medications: Reviewed & Updated - see associated section                       Current Outpatient Medications:    Aspirin-Salicylamide-Caffeine (BC HEADACHE) 325-95-16 MG TABS, Take 1 packet by mouth daily as needed (for pain)., Disp: , Rfl:    DULoxetine (CYMBALTA) 60 MG capsule, Take 60 mg by mouth daily. , Disp: , Rfl:    gabapentin (NEURONTIN) 300 MG capsule, Take 300 mg by mouth 3 (three) times daily., Disp: , Rfl:    TRAZODONE HCL PO, Take 50 mg by mouth at bedtime. , Disp: , Rfl:    Social History: Reviewed -  reports that she has been smoking cigarettes. She has a 7.50 pack-year smoking history. She has never used smokeless tobacco.  Objective Findings:  Vitals:  There were no vitals taken for this visit.  PHYSICAL EXAMINATION General appearance - alert, well appearing, and in no distress Mental status - alert, oriented to person, place, and time, normal mood, behavior, speech, dress, motor activity, and thought processes, affect appropriate to mood  PELVIC External genitalia - 2 left inner thigh pigmented nevi, body reacting to stitch as foreign body. Stitches removed  Assessment & Plan:   A:  1.  Pigmented nevi on left inner thigh and abdomen  P:  1. Stitches on left thigh removed 2. Neosporin topical 3. Shave biopsy skin lesion on back    By signing my name below, I, Samul Dada, attest that this documentation has been prepared under the direction and in the presence of Jonnie Kind, MD. Electronically Signed: [place]. 01/03/19. 3:32 PM.  I personally performed the services described in this documentation, which was SCRIBED in my presence. The recorded information has been reviewed and considered accurate. It has been edited as necessary during review. Jonnie Kind, MD

## 2019-01-04 ENCOUNTER — Other Ambulatory Visit (HOSPITAL_BASED_OUTPATIENT_CLINIC_OR_DEPARTMENT_OTHER): Payer: Self-pay

## 2019-01-09 ENCOUNTER — Other Ambulatory Visit: Payer: Self-pay

## 2019-01-09 ENCOUNTER — Other Ambulatory Visit (HOSPITAL_COMMUNITY)
Admission: RE | Admit: 2019-01-09 | Discharge: 2019-01-09 | Disposition: A | Discharge status: Home / Self Care | Payer: Medicaid Other | Source: Ambulatory Visit | Attending: Neurology | Admitting: Neurology

## 2019-01-09 DIAGNOSIS — Z20828 Contact with and (suspected) exposure to other viral communicable diseases: Secondary | ICD-10-CM | POA: Insufficient documentation

## 2019-01-09 DIAGNOSIS — Z01812 Encounter for preprocedural laboratory examination: Secondary | ICD-10-CM | POA: Diagnosis not present

## 2019-01-09 LAB — SARS CORONAVIRUS 2 (TAT 6-24 HRS): SARS Coronavirus 2: NEGATIVE

## 2019-01-12 ENCOUNTER — Other Ambulatory Visit: Payer: Self-pay

## 2019-01-12 ENCOUNTER — Ambulatory Visit: Payer: Medicaid Other | Attending: Family Medicine | Admitting: Neurology

## 2019-01-12 DIAGNOSIS — Z79899 Other long term (current) drug therapy: Secondary | ICD-10-CM | POA: Insufficient documentation

## 2019-01-12 DIAGNOSIS — R5383 Other fatigue: Secondary | ICD-10-CM | POA: Insufficient documentation

## 2019-01-12 DIAGNOSIS — R0681 Apnea, not elsewhere classified: Secondary | ICD-10-CM | POA: Insufficient documentation

## 2019-01-12 DIAGNOSIS — G4733 Obstructive sleep apnea (adult) (pediatric): Secondary | ICD-10-CM

## 2019-01-12 DIAGNOSIS — R0683 Snoring: Secondary | ICD-10-CM | POA: Diagnosis present

## 2019-01-12 DIAGNOSIS — G4710 Hypersomnia, unspecified: Secondary | ICD-10-CM

## 2019-01-19 ENCOUNTER — Ambulatory Visit: Payer: Medicaid Other | Admitting: Obstetrics & Gynecology

## 2019-01-21 NOTE — Procedures (Signed)
  [name], MD     [email or web address]             NOCTURNAL POLYSOMNOGRAPHY   LOCATION: [facility]   Patient Name: Brianna Hughes, Brianna Hughes Date: 01/12/2019 Gender: Female D.O.B: July 02, 1985 Age (years): 33 Referring Provider: Caryl Bis Height (inches): 108 Interpreting Physician: Phillips Odor MD, ABSM Weight (lbs): 193 RPSGT: Peak, Robert BMI: 31 MRN: [id number] Neck Size: 15.00 CLINICAL INFORMATION Sleep Study Type: NPSG     Indication for sleep study: Excessive Daytime Sleepiness, Fatigue, OSA, Snoring     Epworth Sleepiness Score: 21     SLEEP STUDY TECHNIQUE As per the AASM Manual for the Scoring of Sleep and Associated Events v2.3 (April 2016) with a hypopnea requiring 4% desaturations.  The channels recorded and monitored were frontal, central and occipital EEG, electrooculogram (EOG), submentalis EMG (chin), nasal and oral airflow, thoracic and abdominal wall motion, anterior tibialis EMG, snore microphone, electrocardiogram, and pulse oximetry.  MEDICATIONS Medications self-administered by patient taken the night of the study : N/A  Current Outpatient Medications:  .  Aspirin-Salicylamide-Caffeine (BC HEADACHE) 325-95-16 MG TABS, Take 1 packet by mouth daily as needed (for pain)., Disp: , Rfl:  .  DULoxetine (CYMBALTA) 60 MG capsule, Take 60 mg by mouth 2 (two) times daily. , Disp: , Rfl:  .  gabapentin (NEURONTIN) 300 MG capsule, Take 300 mg by mouth 3 (three) times daily., Disp: , Rfl:  .  TRAZODONE HCL PO, Take 50 mg by mouth at bedtime. , Disp: , Rfl:      SLEEP ARCHITECTURE The study was initiated at 10:37:51 PM and ended at 5:36:10 AM.  Sleep onset time was 2.5 minutes and the sleep efficiency was 96.3%%. The total sleep time was 403 minutes.  Stage REM latency was 134.5 minutes.  The patient spent 1.5%% of the night in stage N1 sleep, 61.7%% in stage N2 sleep, 21.2%% in stage N3 and 15.6% in REM.  Alpha  intrusion was absent.  Supine sleep was 0.00%.  RESPIRATORY PARAMETERS The overall apnea/hypopnea index (AHI) was 2.7 per hour. There were 8 total apneas, including 4 obstructive, 4 central and 0 mixed apneas. There were 10 hypopneas and 8 RERAs.  The AHI during Stage REM sleep was 11.4 per hour.  AHI while supine was N/A per hour.  The mean oxygen saturation was 93.5%. The minimum SpO2 during sleep was 86.0%.  moderate snoring was noted during this study.  CARDIAC DATA The 2 lead EKG demonstrated sinus rhythm. The mean heart rate was 62.9 beats per minute. Other EKG findings include: None.   LEG MOVEMENT DATA The total PLMS were 0 with a resulting PLMS index of 0.0. Associated arousal with leg movement index was 0.0.  IMPRESSIONS No significant obstructive sleep apnea occurred during this study. No significant central sleep apnea occurred during this study.  Delano Metz, MD Diplomate, American Board of Sleep Medicine.   ELECTRONICALLY SIGNED ON:  01/21/2019, 5:33 PM [place] PH: [phone]   FX: [phone] [place]

## 2019-01-22 ENCOUNTER — Ambulatory Visit: Payer: Medicaid Other | Admitting: Women's Health

## 2019-02-07 ENCOUNTER — Other Ambulatory Visit: Payer: Self-pay

## 2019-02-07 ENCOUNTER — Other Ambulatory Visit: Payer: Self-pay | Admitting: Women's Health

## 2019-02-07 ENCOUNTER — Ambulatory Visit: Payer: Medicaid Other | Admitting: *Deleted

## 2019-02-07 VITALS — BP 123/86 | HR 79

## 2019-02-07 DIAGNOSIS — Z3201 Encounter for pregnancy test, result positive: Secondary | ICD-10-CM

## 2019-02-07 LAB — POCT URINE PREGNANCY: Preg Test, Ur: POSITIVE — AB

## 2019-02-07 MED ORDER — DOXYLAMINE-PYRIDOXINE 10-10 MG PO TBEC: DELAYED_RELEASE_TABLET | ORAL | 6 refills | Status: DC

## 2019-02-07 NOTE — Progress Notes (Signed)
   NURSE VISIT- PREGNANCY CONFIRMATION   SUBJECTIVE:  Brianna Hughes is a 33 y.o. [phone] female at Unknown by uncertain LMP of Patient's last menstrual period was 12/22/2018 (within weeks). Here for pregnancy confirmation.  Home pregnancy test: positive x 2  She reports nausea and vomiting.  She is taking prenatal vitamins.    OBJECTIVE:  BP 123/86 (BP Location: Right Arm, Patient Position: Sitting, Cuff Size: Normal)   Pulse 79   LMP 12/22/2018 (Within Weeks)   Appears well, in no apparent distress OB History  Gravida Para Term Preterm AB Living  3 2 1 1  0 2  SAB TAB Ectopic Multiple Live Births  0 0 0 0 2    # Outcome Date GA Lbr Len/2nd Weight Sex Delivery Anes PTL Lv  3 Current           2 Term 10/13/11 [redacted]w[redacted]d 11:53 / 00:23 5 lb 14.8 oz (2.688 kg) M Vag-Spont EPI  LIV  1 Preterm  [redacted]w[redacted]d   F Vag-Spont   LIV    Results for orders placed or performed in visit on 02/07/19 (from the past 24 hour(s))  POCT urine pregnancy   Collection Time: 02/07/19 10:23 AM  Result Value Ref Range   Preg Test, Ur Positive (A) Negative    ASSESSMENT: Positive pregnancy test, Unknown by LMP    PLAN: Schedule for dating ultrasound in 1 week Prenatal vitamins: continue   Nausea medicines: requested-note routed to [facility] to send prescription   OB packet given: Yes  Rash, Brianna Hughes  02/07/2019 10:25 AM

## 2019-02-13 ENCOUNTER — Other Ambulatory Visit: Payer: Self-pay | Admitting: Obstetrics & Gynecology

## 2019-02-13 DIAGNOSIS — O3680X0 Pregnancy with inconclusive fetal viability, not applicable or unspecified: Secondary | ICD-10-CM

## 2019-02-14 ENCOUNTER — Ambulatory Visit (INDEPENDENT_AMBULATORY_CARE_PROVIDER_SITE_OTHER): Payer: Medicaid Other

## 2019-02-14 ENCOUNTER — Other Ambulatory Visit: Payer: Self-pay

## 2019-02-14 DIAGNOSIS — Z3A09 9 weeks gestation of pregnancy: Secondary | ICD-10-CM

## 2019-02-14 DIAGNOSIS — O3680X0 Pregnancy with inconclusive fetal viability, not applicable or unspecified: Secondary | ICD-10-CM | POA: Diagnosis not present

## 2019-02-14 NOTE — Progress Notes (Signed)
[place] 9+1 wks,single IUP w/ys,positive fht 173 bpm,normal ovaries bilat,crl 25.04 mm

## 2019-02-27 ENCOUNTER — Other Ambulatory Visit: Payer: Self-pay | Admitting: Women's Health

## 2019-02-27 MED ORDER — PROMETHAZINE HCL 25 MG PO TABS: 12.5000 mg | ORAL_TABLET | Freq: Four times a day (QID) | ORAL | 0 refills | Status: DC | PRN

## 2019-03-06 ENCOUNTER — Other Ambulatory Visit: Payer: Self-pay | Admitting: Obstetrics and Gynecology

## 2019-03-06 DIAGNOSIS — Z3682 Encounter for antenatal screening for nuchal translucency: Secondary | ICD-10-CM

## 2019-03-07 ENCOUNTER — Ambulatory Visit: Payer: Medicaid Other | Admitting: *Deleted

## 2019-03-07 ENCOUNTER — Ambulatory Visit (INDEPENDENT_AMBULATORY_CARE_PROVIDER_SITE_OTHER): Payer: Medicaid Other

## 2019-03-07 ENCOUNTER — Encounter: Payer: Self-pay | Admitting: Women's Health

## 2019-03-07 ENCOUNTER — Other Ambulatory Visit: Payer: Self-pay

## 2019-03-07 ENCOUNTER — Ambulatory Visit (INDEPENDENT_AMBULATORY_CARE_PROVIDER_SITE_OTHER): Payer: Medicaid Other | Admitting: Women's Health

## 2019-03-07 VITALS — BP 128/96 | HR 80 | Wt 194.0 lb

## 2019-03-07 DIAGNOSIS — Z1389 Encounter for screening for other disorder: Secondary | ICD-10-CM

## 2019-03-07 DIAGNOSIS — O0990 Supervision of high risk pregnancy, unspecified, unspecified trimester: Secondary | ICD-10-CM

## 2019-03-07 DIAGNOSIS — Z1371 Encounter for nonprocreative screening for genetic disease carrier status: Secondary | ICD-10-CM

## 2019-03-07 DIAGNOSIS — O0991 Supervision of high risk pregnancy, unspecified, first trimester: Secondary | ICD-10-CM | POA: Diagnosis not present

## 2019-03-07 DIAGNOSIS — Z3682 Encounter for antenatal screening for nuchal translucency: Secondary | ICD-10-CM | POA: Diagnosis not present

## 2019-03-07 DIAGNOSIS — Z36 Encounter for antenatal screening for chromosomal anomalies: Secondary | ICD-10-CM

## 2019-03-07 DIAGNOSIS — Z3A12 12 weeks gestation of pregnancy: Secondary | ICD-10-CM | POA: Diagnosis not present

## 2019-03-07 DIAGNOSIS — F1120 Opioid dependence, uncomplicated: Secondary | ICD-10-CM

## 2019-03-07 DIAGNOSIS — O10919 Unspecified pre-existing hypertension complicating pregnancy, unspecified trimester: Secondary | ICD-10-CM

## 2019-03-07 DIAGNOSIS — Z130 Encounter for screening for diseases of the blood and blood-forming organs and certain disorders involving the immune mechanism: Secondary | ICD-10-CM

## 2019-03-07 DIAGNOSIS — Z0283 Encounter for blood-alcohol and blood-drug test: Secondary | ICD-10-CM

## 2019-03-07 DIAGNOSIS — O09891 Supervision of other high risk pregnancies, first trimester: Secondary | ICD-10-CM

## 2019-03-07 DIAGNOSIS — Z331 Pregnant state, incidental: Secondary | ICD-10-CM

## 2019-03-07 DIAGNOSIS — I10 Essential (primary) hypertension: Secondary | ICD-10-CM

## 2019-03-07 DIAGNOSIS — O09899 Supervision of other high risk pregnancies, unspecified trimester: Secondary | ICD-10-CM

## 2019-03-07 DIAGNOSIS — O10911 Unspecified pre-existing hypertension complicating pregnancy, first trimester: Secondary | ICD-10-CM

## 2019-03-07 DIAGNOSIS — Z363 Encounter for antenatal screening for malformations: Secondary | ICD-10-CM

## 2019-03-07 DIAGNOSIS — O99321 Drug use complicating pregnancy, first trimester: Secondary | ICD-10-CM

## 2019-03-07 DIAGNOSIS — Z113 Encounter for screening for infections with a predominantly sexual mode of transmission: Secondary | ICD-10-CM

## 2019-03-07 DIAGNOSIS — Z8751 Personal history of pre-term labor: Secondary | ICD-10-CM | POA: Insufficient documentation

## 2019-03-07 DIAGNOSIS — O99320 Drug use complicating pregnancy, unspecified trimester: Secondary | ICD-10-CM

## 2019-03-07 DIAGNOSIS — Z3401 Encounter for supervision of normal first pregnancy, first trimester: Secondary | ICD-10-CM

## 2019-03-07 DIAGNOSIS — Z1379 Encounter for other screening for genetic and chromosomal anomalies: Secondary | ICD-10-CM

## 2019-03-07 LAB — POCT URINALYSIS DIPSTICK OB
Blood, UA: NEGATIVE
Glucose, UA: NEGATIVE
Ketones, UA: NEGATIVE
Leukocytes, UA: NEGATIVE
Nitrite, UA: NEGATIVE
POC,PROTEIN,UA: NEGATIVE

## 2019-03-07 NOTE — Patient Instructions (Addendum)
Brianna Hughes, I greatly value your feedback.  If you receive a survey following your visit with [place] today, we appreciate you taking the time to fill it out.  Thanks, Knute Neu, CNM, [facility]  [place]!!! It is now [place] [facility] ([place], [place] [address]) Entrance located off of [address] parking   Begin taking 162mg  (two 81mg  tablets) baby aspirin daily to decrease risk of preeclampsia during pregnancy     Nausea & Vomiting  Have saltine crackers or pretzels by your bed and eat a few bites before you raise your head out of bed in the morning  Eat small frequent meals throughout the day instead of large meals  Drink plenty of fluids throughout the day to stay hydrated, just don't drink a lot of fluids with your meals.  This can make your stomach fill up faster making you feel sick  Do not brush your teeth right after you eat  Products with real ginger are good for nausea, like ginger ale and ginger hard candy Make sure it says made with real ginger!  Sucking on sour candy like lemon heads is also good for nausea  If your prenatal vitamins make you nauseated, take them at night so you will sleep through the nausea  Sea Bands  If you feel like you need medicine for the nausea & vomiting please let us know  If you are unable to keep any fluids or food down please let us know   Constipation  Drink plenty of fluid, preferably water, throughout the day  Eat foods high in fiber such as fruits, vegetables, and grains  Exercise, such as walking, is a good way to keep your bowels regular  Drink warm fluids, especially warm prune juice, or decaf coffee  Eat a 1/2 cup of real oatmeal (not instant), 1/2 cup applesauce, and 1/2-1 cup warm prune juice every day  If needed, you may take Colace (docusate sodium) stool softener once or twice a day to help keep the stool soft.   If you still are having  problems with constipation, you may take Miralax once daily as needed to help keep your bowels regular.   Home Blood Pressure Monitoring for Patients   Check your blood pressure 4 times daily and keep a log, bring this with you to all appointments.  If blood pressure is >=160 on top or >=110 on bottom, check again in 5 minutes, if still this high, call us or go to [facility] to be evaluated   Helpful Tips for Accurate Home Blood Pressure Checks  . Don't smoke, exercise, or drink caffeine 30 minutes before checking your BP . Use the restroom before checking your BP (a full bladder can raise your pressure) . Relax in a comfortable upright chair . Feet on the ground . Left arm resting comfortably on a flat surface at the level of your heart . Legs uncrossed . Back supported . Sit quietly and don't talk . Place the cuff on your bare arm . Adjust snuggly, so that only two fingertips can fit between your skin and the top of the cuff . Check 2 readings separated by at least one minute . Keep a log of your BP readings . For a visual, please reference this diagram: http://ccnc.care/bpdiagram  Provider Name: [facility]     Phone: [phone]  Zone 1: ALL CLEAR  Continue to monitor your symptoms:  .  BP reading is less than 140 (top number) or less than 90 (bottom number)  . No right upper stomach pain . No headaches or seeing spots . No feeling nauseated or throwing up . No swelling in face and hands  Zone 2: CAUTION Call your doctor's office for any of the following:  . BP reading is greater than 140 (top number) or greater than 90 (bottom number)  . Stomach pain under your ribs in the middle or right side . Headaches or seeing spots . Feeling nauseated or throwing up . Swelling in face and hands  Zone 3: EMERGENCY  Seek immediate medical care if you have any of the following:  . BP reading is greater than160 (top number) or greater than 110 (bottom number) . Severe  headaches not improving with Tylenol . Serious difficulty catching your breath . Any worsening symptoms from Zone 2    First Trimester of Pregnancy The first trimester of pregnancy is from week 1 until the end of week 12 (months 1 through 3). A week after a sperm fertilizes an egg, the egg will implant on the wall of the uterus. This embryo will begin to develop into a baby. Genes from you and your partner are forming the baby. The female genes determine whether the baby is a boy or a girl. At 6-8 weeks, the eyes and face are formed, and the heartbeat can be seen on ultrasound. At the end of 12 weeks, all the baby's organs are formed.  Now that you are pregnant, you will want to do everything you can to have a healthy baby. Two of the most important things are to get good prenatal care and to follow your health care provider's instructions. Prenatal care is all the medical care you receive before the baby's birth. This care will help prevent, find, and treat any problems during the pregnancy and childbirth. BODY CHANGES Your body goes through many changes during pregnancy. The changes vary from woman to woman.   You may gain or lose a couple of pounds at first.  You may feel sick to your stomach (nauseous) and throw up (vomit). If the vomiting is uncontrollable, call your health care provider.  You may tire easily.  You may develop headaches that can be relieved by medicines approved by your health care provider.  You may urinate more often. Painful urination may mean you have a bladder infection.  You may develop heartburn as a result of your pregnancy.  You may develop constipation because certain hormones are causing the muscles that push waste through your intestines to slow down.  You may develop hemorrhoids or swollen, bulging veins (varicose veins).  Your breasts may begin to grow larger and become tender. Your nipples may stick out more, and the tissue that surrounds them (areola) may  become darker.  Your gums may bleed and may be sensitive to brushing and flossing.  Dark spots or blotches (chloasma, mask of pregnancy) may develop on your face. This will likely fade after the baby is born.  Your menstrual periods will stop.  You may have a loss of appetite.  You may develop cravings for certain kinds of food.  You may have changes in your emotions from day to day, such as being excited to be pregnant or being concerned that something may go wrong with the pregnancy and baby.  You may have more vivid and strange dreams.  You may have changes in your hair. These can include thickening of your  hair, rapid growth, and changes in texture. Some women also have hair loss during or after pregnancy, or hair that feels dry or thin. Your hair will most likely return to normal after your baby is born. WHAT TO EXPECT AT YOUR PRENATAL VISITS During a routine prenatal visit:  You will be weighed to make sure you and the baby are growing normally.  Your blood pressure will be taken.  Your abdomen will be measured to track your baby's growth.  The fetal heartbeat will be listened to starting around week 10 or 12 of your pregnancy.  Test results from any previous visits will be discussed. Your health care provider may ask you:  How you are feeling.  If you are feeling the baby move.  If you have had any abnormal symptoms, such as leaking fluid, bleeding, severe headaches, or abdominal cramping.  If you have any questions. Other tests that may be performed during your first trimester include:  Blood tests to find your blood type and to check for the presence of any previous infections. They will also be used to check for low iron levels (anemia) and Rh antibodies. Later in the pregnancy, blood tests for diabetes will be done along with other tests if problems develop.  Urine tests to check for infections, diabetes, or protein in the urine.  An ultrasound to confirm the  proper growth and development of the baby.  An amniocentesis to check for possible genetic problems.  Fetal screens for spina bifida and Down syndrome.  You may need other tests to make sure you and the baby are doing well. HOME CARE INSTRUCTIONS  Medicines  Follow your health care provider's instructions regarding medicine use. Specific medicines may be either safe or unsafe to take during pregnancy.  Take your prenatal vitamins as directed.  If you develop constipation, try taking a stool softener if your health care provider approves. Diet  Eat regular, well-balanced meals. Choose a variety of foods, such as meat or vegetable-based protein, fish, milk and low-fat dairy products, vegetables, fruits, and whole grain breads and cereals. Your health care provider will help you determine the amount of weight gain that is right for you.  Avoid raw meat and uncooked cheese. These carry germs that can cause birth defects in the baby.  Eating four or five small meals rather than three large meals a day may help relieve nausea and vomiting. If you start to feel nauseous, eating a few soda crackers can be helpful. Drinking liquids between meals instead of during meals also seems to help nausea and vomiting.  If you develop constipation, eat more high-fiber foods, such as fresh vegetables or fruit and whole grains. Drink enough fluids to keep your urine clear or pale yellow. Activity and Exercise  Exercise only as directed by your health care provider. Exercising will help you:  Control your weight.  Stay in shape.  Be prepared for labor and delivery.  Experiencing pain or cramping in the lower abdomen or low back is a good sign that you should stop exercising. Check with your health care provider before continuing normal exercises.  Try to avoid standing for long periods of time. Move your legs often if you must stand in one place for a long time.  Avoid heavy lifting.  Wear  low-heeled shoes, and practice good posture.  You may continue to have sex unless your health care provider directs you otherwise. Relief of Pain or Discomfort  Wear a good support bra for breast  tenderness.    Take warm sitz baths to soothe any pain or discomfort caused by hemorrhoids. Use hemorrhoid cream if your health care provider approves.    Rest with your legs elevated if you have leg cramps or low back pain.  If you develop varicose veins in your legs, wear support hose. Elevate your feet for 15 minutes, 3-4 times a day. Limit salt in your diet. Prenatal Care  Schedule your prenatal visits by the twelfth week of pregnancy. They are usually scheduled monthly at first, then more often in the last 2 months before delivery.  Write down your questions. Take them to your prenatal visits.  Keep all your prenatal visits as directed by your health care provider. Safety  Wear your seat belt at all times when driving.  Make a list of emergency phone numbers, including numbers for family, friends, the hospital, and police and fire departments. General Tips  Ask your health care provider for a referral to a local prenatal education class. Begin classes no later than at the beginning of month 6 of your pregnancy.  Ask for help if you have counseling or nutritional needs during pregnancy. Your health care provider can offer advice or refer you to specialists for help with various needs.  Do not use hot tubs, steam rooms, or saunas.  Do not douche or use tampons or scented sanitary pads.  Do not cross your legs for long periods of time.  Avoid cat litter boxes and soil used by cats. These carry germs that can cause birth defects in the baby and possibly loss of the fetus by miscarriage or stillbirth.  Avoid all smoking, herbs, alcohol, and medicines not prescribed by your health care provider. Chemicals in these affect the formation and growth of the baby.  Schedule a dentist  appointment. At home, brush your teeth with a soft toothbrush and be gentle when you floss. SEEK MEDICAL CARE IF:   You have dizziness.  You have mild pelvic cramps, pelvic pressure, or nagging pain in the abdominal area.  You have persistent nausea, vomiting, or diarrhea.  You have a bad smelling vaginal discharge.  You have pain with urination.  You notice increased swelling in your face, hands, legs, or ankles. SEEK IMMEDIATE MEDICAL CARE IF:   You have a fever.  You are leaking fluid from your vagina.  You have spotting or bleeding from your vagina.  You have severe abdominal cramping or pain.  You have rapid weight gain or loss.  You vomit blood or material that looks like coffee grounds.  You are exposed to [place] measles and have never had them.  You are exposed to fifth disease or chickenpox.  You develop a severe headache.  You have shortness of breath.  You have any kind of trauma, such as from a fall or a car accident. Document Released: 04/20/2001 Document Revised: 09/10/2013 Document Reviewed: 03/06/2013 [facility] Patient Information 2015 [place], [place]. This information is not intended to replace advice given to you by your health care provider. Make sure you discuss any questions you have with your health care provider.  Coronavirus (COVID-19) Are you at risk?  Are you at risk for the Coronavirus (COVID-19)?  To be considered HIGH RISK for Coronavirus (COVID-19), you have to meet the following criteria:  . Traveled to [place], [place], [place], [place] or [place]; or in the [place] to [place], [place], [place], or [place]; and have fever, cough, and shortness of breath within the last  2 weeks of travel OR . Been in close contact with a person diagnosed with COVID-19 within the last 2 weeks and have fever, cough, and shortness of breath . IF YOU DO NOT MEET THESE CRITERIA, YOU ARE CONSIDERED LOW RISK FOR COVID-19.  What to do if you are HIGH  RISK for COVID-19?  Marland Kitchen If you are having a medical emergency, call 911. . Seek medical care right away. Before you go to a doctor's office, urgent care or emergency department, call ahead and tell them about your recent travel, contact with someone diagnosed with COVID-19, and your symptoms. You should receive instructions from your physician's office regarding next steps of care.  . When you arrive at healthcare provider, tell the healthcare staff immediately you have returned from visiting [place], [place], [place], [place] or [place]; or traveled in the [place] to [place], [place], [place], or [place]; in the last two weeks or you have been in close contact with a person diagnosed with COVID-19 in the last 2 weeks.   . Tell the health care staff about your symptoms: fever, cough and shortness of breath. . After you have been seen by a medical provider, you will be either: o Tested for (COVID-19) and discharged home on quarantine except to seek medical care if symptoms worsen, and asked to  - Stay home and avoid contact with others until you get your results (4-5 days)  - Avoid travel on public transportation if possible (such as bus, train, or airplane) or o Sent to the Emergency Department by EMS for evaluation, COVID-19 testing, and possible admission depending on your condition and test results.  What to do if you are LOW RISK for COVID-19?  Reduce your risk of any infection by using the same precautions used for avoiding the common cold or flu:  Marland Kitchen Wash your hands often with soap and warm water for at least 20 seconds.  If soap and water are not readily available, use an alcohol-based hand sanitizer with at least 60% alcohol.  . If coughing or sneezing, cover your mouth and nose by coughing or sneezing into the elbow areas of your shirt or coat, into a tissue or into your sleeve (not your hands). . Avoid shaking hands with others and consider head nods or verbal greetings only. .  Avoid touching your eyes, nose, or mouth with unwashed hands.  . Avoid close contact with people who are sick. . Avoid places or events with large numbers of people in one location, like concerts or sporting events. . Carefully consider travel plans you have or are making. . If you are planning any travel outside or inside the [place], visit the CDC's Travelers' Health webpage for the latest health notices. . If you have some symptoms but not all symptoms, continue to monitor at home and seek medical attention if your symptoms worsen. . If you are having a medical emergency, call 911.   [place] / e-Visit: [email or web address]         [facility]: [facility]: [phone]                   [facility]: [phone]

## 2019-03-07 NOTE — Progress Notes (Signed)
INITIAL OBSTETRICAL VISIT Patient name: Brianna Hughes MRN [id number]  Date of birth: [birth year] Chief Complaint:   Initial Prenatal Visit (nt/it, morning sickness)  History of Present Illness:   Brianna Hughes is a 33 y.o. G5P1102 Caucasian female at [redacted]w[redacted]d by 9wk u/s, with an Estimated Date of Delivery: 09/18/19 being seen today for her initial obstetrical visit.   Her obstetrical history is significant for 35wk PTB d/t PPROM, then term SVB (17P this pregnancy).   Today she reports n/v, diclegis didn't work, phenergan helps some, but not much.  Was on percocet5-10mg  (5-10 times/day) prior to pregnancy d/t chronic pain/fibromyalgia, just got started on subutex 8mg  BID w/ [facility] CHTN, h/o GHTN, never on meds Patient's last menstrual period was 12/22/2018 (within weeks). Last pap 12/08/16. Results were: normal Review of Systems:   Pertinent items are noted in HPI Denies cramping/contractions, leakage of fluid, vaginal bleeding, abnormal vaginal discharge w/ itching/odor/irritation, headaches, visual changes, shortness of breath, chest pain, abdominal pain, severe nausea/vomiting, or problems with urination or bowel movements unless otherwise stated above.  Pertinent History Reviewed:  Reviewed past medical,surgical, social, obstetrical and family history.  Reviewed problem list, medications and allergies. OB History  Gravida Para Term Preterm AB Living  3 2 1 1  0 2  SAB TAB Ectopic Multiple Live Births  0 0 0 0 2    # Outcome Date GA Lbr Len/2nd Weight Sex Delivery Anes PTL Lv  3 Current           2 Term 10/13/11 [redacted]w[redacted]d 11:53 / 00:23 5 lb 14.8 oz (2.688 kg) M Vag-Spont EPI N LIV  1 Preterm 01/06/09 [redacted]w[redacted]d  4 lb 11 oz (2.126 kg) F Vag-Spont EPI Y LIV     Complications: Preterm premature rupture of membranes (PPROM) delivered, current hospitalization   Physical Assessment:   Vitals:   03/07/19 1040  BP: (!) 128/96  Pulse: 80  Weight: 194 lb (88 kg)  Body mass index is 31.31  kg/m.       Physical Examination:  General appearance - well appearing, and in no distress  Mental status - alert, oriented to person, place, and time  Psych:  She has a normal mood and affect  Skin - warm and dry, normal color, no suspicious lesions noted  Chest - effort normal, all lung fields clear to auscultation bilaterally  Heart - normal rate and regular rhythm  Abdomen - soft, nontender  Extremities:  No swelling or varicosities noted  Thin prep pap is not done  TODAY'S NT [place] 12+1 wks,measurements c/w dates,crl 58.8 mm,normal ovaries bilat,NB present,NT 1.1 mm,anterior placenta,fhr 169 bpm  Results for orders placed or performed in visit on 03/07/19 (from the past 24 hour(s))  POC Urinalysis Dipstick OB   Collection Time: 03/07/19 11:03 AM  Result Value Ref Range   Color, UA     Clarity, UA     Glucose, UA Negative Negative   Bilirubin, UA     Ketones, UA neg    Spec Grav, UA     Blood, UA neg    pH, UA     POC,PROTEIN,UA Negative Negative, Trace, Small (1+), Moderate (2+), Large (3+), 4+   Urobilinogen, UA     Nitrite, UA neg    Leukocytes, UA Negative Negative   Appearance     Odor      Assessment & Plan:  1) High-Risk Pregnancy [id number] at [redacted]w[redacted]d with an Estimated Date of Delivery: 09/18/19   2) Initial OB visit  3) CHTN> check bp QID at home, bring back in 1wk for bp check w/ nurse. Start ASA 162mg  daily now. Baseline labs today  4) H/O 35wk PTB d/t PPROM> wants Makena, has to leave today, will sign order form when comes back in 1wk for bp check w/ nurse  5) H/O opiate addiction> just recently started subutex 8mg  BID w/ [facility]  Meds: No orders of the defined types were placed in this encounter.   Initial labs obtained Continue prenatal vitamins Reviewed n/v relief measures and warning s/s to report Reviewed recommended weight gain based on pre-gravid BMI Encouraged well-balanced diet Genetic Screening discussed: requested nt/it, maternit21  Cystic fibrosis, SMA, Fragile X screening discussed requested Ultrasound discussed; fetal survey: requested CCNC completed>PCM not here, form faxed The nature of [place] for [employer] with multiple MDs and other Advanced Practice Providers was explained to patient; also emphasized that fellows, residents, and students are part of our team. Has home bp cuff Declined flu shot today, wants next week  Follow-up: Return for 1wk bp check w/ nurse (order Makena), then 6wks HROB w/ 2nd IT and anatomy u/s in office w/ MD/CNM.   Orders Placed This Encounter  Procedures  . Urine Culture  . GC/Chlamydia Probe Amp  . Obstetric Panel, Including HIV  . Pain Management Screening Profile (10S)  . Urinalysis, Routine w reflex microscopic  . Comprehensive metabolic panel  . Sickle cell screen  . Integrated 1  . MaterniT 21 plus Core, Blood  . Inheritest Core(CF97,SMA,FraX)  . Protein, urine, 24 hour  . POC Urinalysis Dipstick OB    Roma Schanz CNM, [facility] 03/07/2019 11:31 AM

## 2019-03-07 NOTE — Progress Notes (Signed)
[place] 12+1 wks,measurements c/w dates,crl 58.8 mm,normal ovaries bilat,NB present,NT 1.1 mm,anterior placenta,fhr 169 bpm

## 2019-03-08 LAB — COMPREHENSIVE METABOLIC PANEL
ALT: 15 IU/L (ref 0–32)
AST: 12 IU/L (ref 0–40)
Albumin/Globulin Ratio: 1.4 (ref 1.2–2.2)
Albumin: 3.8 g/dL (ref 3.8–4.8)
Alkaline Phosphatase: 77 IU/L (ref 39–117)
BUN/Creatinine Ratio: 11 (ref 9–23)
BUN: 6 mg/dL (ref 6–20)
Bilirubin Total: <0.2 mg/dL (ref 0.0–1.2)
CO2: 20 mmol/L (ref 20–29)
Calcium: 8.8 mg/dL (ref 8.7–10.2)
Chloride: 103 mmol/L (ref 96–106)
Creatinine, Ser: 0.53 mg/dL — ABNORMAL LOW (ref 0.57–1.00)
GFR calc Af Amer: 145 mL/min/{1.73_m2} (ref >59)
GFR calc non Af Amer: 126 mL/min/{1.73_m2} (ref >59)
Globulin, Total: 2.8 g/dL (ref 1.5–4.5)
Glucose: 135 mg/dL — ABNORMAL HIGH (ref 65–99)
Potassium: 3.8 mmol/L (ref 3.5–5.2)
Sodium: 135 mmol/L (ref 134–144)
Total Protein: 6.6 g/dL (ref 6.0–8.5)

## 2019-03-08 LAB — OBSTETRIC PANEL, INCLUDING HIV
Antibody Screen: NEGATIVE
Basophils Absolute: 0 10*3/uL (ref 0.0–0.2)
Basos: 0 %
EOS (ABSOLUTE): 0.1 10*3/uL (ref 0.0–0.4)
Eos: 1 %
HIV Screen 4th Generation wRfx: NONREACTIVE
Hematocrit: 39.2 % (ref 34.0–46.6)
Hemoglobin: 13.4 g/dL (ref 11.1–15.9)
Hepatitis B Surface Ag: NEGATIVE
Immature Grans (Abs): 0 10*3/uL (ref 0.0–0.1)
Immature Granulocytes: 0 %
Lymphocytes Absolute: 1.6 10*3/uL (ref 0.7–3.1)
Lymphs: 17 %
MCH: 30.9 pg (ref 26.6–33.0)
MCHC: 34.2 g/dL (ref 31.5–35.7)
MCV: 91 fL (ref 79–97)
Monocytes Absolute: 0.4 10*3/uL (ref 0.1–0.9)
Monocytes: 4 %
Neutrophils Absolute: 7.1 10*3/uL — ABNORMAL HIGH (ref 1.4–7.0)
Neutrophils: 78 %
Platelets: 277 10*3/uL (ref 150–450)
RBC: 4.33 x10E6/uL (ref 3.77–5.28)
RDW: 13 % (ref 11.7–15.4)
RPR Ser Ql: NONREACTIVE
Rh Factor: POSITIVE
Rubella Antibodies, IGG: 14.1 index (ref >0.99)
WBC: 9.2 10*3/uL (ref 3.4–10.8)

## 2019-03-08 LAB — SICKLE CELL SCREEN: Sickle Cell Screen: NEGATIVE

## 2019-03-11 LAB — URINALYSIS, ROUTINE W REFLEX MICROSCOPIC
Bilirubin, UA: NEGATIVE
Glucose, UA: NEGATIVE
Ketones, UA: NEGATIVE
Leukocytes,UA: NEGATIVE
Nitrite, UA: NEGATIVE
Protein,UA: NEGATIVE
RBC, UA: NEGATIVE
Specific Gravity, UA: 1.024 (ref 1.005–1.030)
Urobilinogen, Ur: 0.2 mg/dL (ref 0.2–1.0)
pH, UA: 5.5 (ref 5.0–7.5)

## 2019-03-11 LAB — GC/CHLAMYDIA PROBE AMP
Chlamydia trachomatis, NAA: NEGATIVE
Neisseria Gonorrhoeae by PCR: NEGATIVE

## 2019-03-11 LAB — URINE CULTURE

## 2019-03-11 LAB — PMP SCREEN PROFILE (10S), URINE
Amphetamine Scrn, Ur: NEGATIVE ng/mL
BARBITURATE SCREEN URINE: NEGATIVE ng/mL
BENZODIAZEPINE SCREEN, URINE: NEGATIVE ng/mL
CANNABINOIDS UR QL SCN: NEGATIVE ng/mL
Cocaine (Metab) Scrn, Ur: NEGATIVE ng/mL
Creatinine(Crt), U: 218.1 mg/dL (ref 20.0–300.0)
Methadone Screen, Urine: NEGATIVE ng/mL
OXYCODONE+OXYMORPHONE UR QL SCN: NEGATIVE ng/mL
Opiate Scrn, Ur: NEGATIVE ng/mL
Ph of Urine: 5.8 (ref 4.5–8.9)
Phencyclidine Qn, Ur: NEGATIVE ng/mL
Propoxyphene Scrn, Ur: NEGATIVE ng/mL

## 2019-03-11 LAB — SPECIMEN STATUS REPORT

## 2019-03-12 ENCOUNTER — Encounter: Payer: Self-pay | Admitting: Women's Health

## 2019-03-12 ENCOUNTER — Telehealth: Payer: Self-pay | Admitting: *Deleted

## 2019-03-12 ENCOUNTER — Other Ambulatory Visit: Payer: Self-pay | Admitting: Women's Health

## 2019-03-12 DIAGNOSIS — O99891 Other specified diseases and conditions complicating pregnancy: Secondary | ICD-10-CM | POA: Insufficient documentation

## 2019-03-12 DIAGNOSIS — R8271 Bacteriuria: Secondary | ICD-10-CM | POA: Insufficient documentation

## 2019-03-12 LAB — MATERNIT 21 PLUS CORE, BLOOD
Fetal Fraction: 6
Result (T21): NEGATIVE
Trisomy 13 (Patau syndrome): NEGATIVE
Trisomy 18 (Edwards syndrome): NEGATIVE
Trisomy 21 (Down syndrome): NEGATIVE

## 2019-03-12 LAB — INTEGRATED 1
Crown Rump Length: 58.8 mm
Gest. Age on Collection Date: 12.1 wk
Maternal Age at EDD: 32.9 a
Nuchal Translucency (NT): 1.1 mm
Number of Fetuses: 1
PAPP-A Value: 645.7 ng/mL
Weight: 194 [lb_av]

## 2019-03-12 MED ORDER — CEPHALEXIN 500 MG PO CAPS: 500.0000 mg | ORAL_CAPSULE | Freq: Four times a day (QID) | ORAL | 0 refills | Status: DC

## 2019-03-12 NOTE — Telephone Encounter (Signed)
[facility] called to add HgbA1C to previous blood draw from 10/28.

## 2019-03-13 LAB — SPECIMEN STATUS REPORT

## 2019-03-15 LAB — INHERITEST CORE(CF97,SMA,FRAX)

## 2019-03-15 LAB — HGB A1C W/O EAG: Hgb A1c MFr Bld: 5.5 % (ref 4.8–5.6)

## 2019-03-15 LAB — SPECIMEN STATUS REPORT

## 2019-03-20 ENCOUNTER — Other Ambulatory Visit: Payer: Self-pay

## 2019-03-20 ENCOUNTER — Ambulatory Visit (INDEPENDENT_AMBULATORY_CARE_PROVIDER_SITE_OTHER): Payer: Medicaid Other | Admitting: *Deleted

## 2019-03-20 ENCOUNTER — Other Ambulatory Visit: Payer: Self-pay | Admitting: Women's Health

## 2019-03-20 VITALS — BP 138/99 | HR 81

## 2019-03-20 DIAGNOSIS — O10919 Unspecified pre-existing hypertension complicating pregnancy, unspecified trimester: Secondary | ICD-10-CM

## 2019-03-20 DIAGNOSIS — I10 Essential (primary) hypertension: Secondary | ICD-10-CM

## 2019-03-20 LAB — POCT URINALYSIS DIPSTICK OB
Blood, UA: NEGATIVE
Glucose, UA: NEGATIVE
Ketones, UA: NEGATIVE
Nitrite, UA: NEGATIVE

## 2019-03-20 MED ORDER — LABETALOL HCL 200 MG PO TABS: 200.0000 mg | ORAL_TABLET | Freq: Two times a day (BID) | ORAL | 3 refills | Status: DC

## 2019-03-20 NOTE — Progress Notes (Signed)
   NURSE VISIT- BLOOD PRESSURE CHECK  SUBJECTIVE:  Brianna Hughes is a 33 y.o. [phone] female here for BP check. She is [redacted]w[redacted]d pregnant    HYPERTENSION ROS:  Pregnant/postpartum:  . Severe headaches that don't go away with tylenol/other medicines: Yes  . Visual changes (seeing spots/double/blurred vision) Yes  . Severe pain under right breast breast or in center of upper chest Yes  . Severe nausea/vomiting Yes  . Taking medicines as instructed not applicable   OBJECTIVE:  BP (!) 138/99 (BP Location: Right Arm, Patient Position: Sitting, Cuff Size: Large)   Pulse 81   LMP 12/22/2018 (Within Weeks)   Appearance alert, well appearing, and in no distress and in mild to moderate distress.  ASSESSMENT: Pregnancy [redacted]w[redacted]d  blood pressure check  PLAN: Discussed with Knute Neu, CNM, [facility]   Recommendations: new prescription will be sent   Follow-up: in 2 weeks HROB with provider   Raylyn Speckman, Cedrika Vaynshteyn  03/20/2019 2:51 PM

## 2019-03-21 LAB — PROTEIN, URINE, 24 HOUR
Protein, 24H Urine: 179 mg/24 hr — ABNORMAL HIGH (ref 30–150)
Protein, Ur: 16.3 mg/dL

## 2019-04-03 ENCOUNTER — Encounter: Payer: Self-pay | Admitting: Obstetrics and Gynecology

## 2019-04-03 ENCOUNTER — Ambulatory Visit (INDEPENDENT_AMBULATORY_CARE_PROVIDER_SITE_OTHER): Payer: Medicaid Other | Admitting: Obstetrics and Gynecology

## 2019-04-03 ENCOUNTER — Other Ambulatory Visit: Payer: Self-pay

## 2019-04-03 VITALS — BP 100/62 | HR 82 | Wt 194.2 lb

## 2019-04-03 DIAGNOSIS — Z1389 Encounter for screening for other disorder: Secondary | ICD-10-CM

## 2019-04-03 DIAGNOSIS — O09892 Supervision of other high risk pregnancies, second trimester: Secondary | ICD-10-CM

## 2019-04-03 DIAGNOSIS — Z1379 Encounter for other screening for genetic and chromosomal anomalies: Secondary | ICD-10-CM

## 2019-04-03 DIAGNOSIS — O0990 Supervision of high risk pregnancy, unspecified, unspecified trimester: Secondary | ICD-10-CM

## 2019-04-03 DIAGNOSIS — Z331 Pregnant state, incidental: Secondary | ICD-10-CM

## 2019-04-03 DIAGNOSIS — O0992 Supervision of high risk pregnancy, unspecified, second trimester: Secondary | ICD-10-CM

## 2019-04-03 DIAGNOSIS — Z3A16 16 weeks gestation of pregnancy: Secondary | ICD-10-CM

## 2019-04-03 LAB — POCT URINALYSIS DIPSTICK OB
Blood, UA: NEGATIVE
Glucose, UA: NEGATIVE
Ketones, UA: NEGATIVE
Leukocytes, UA: NEGATIVE
Nitrite, UA: NEGATIVE

## 2019-04-03 MED ORDER — HYDROXYPROGESTERONE CAPROATE 275 MG/1.1ML ~~LOC~~ SOAJ
275.0000 mg | Freq: Once | SUBCUTANEOUS | Status: AC
  Administered 2019-04-03: 275 mg via SUBCUTANEOUS

## 2019-04-03 NOTE — Progress Notes (Signed)
Patient ID: Brianna Hughes, female   DOB: 1985-10-16, 33 y.o.   MRN: [id number]    [facility] PREGNANCY VISIT Patient name: Brianna Hughes MRN [id number]  Date of birth: 04-09-1986 Chief Complaint:   High Risk Gestation (2nd IT/ 17p)  History of Present Illness:   [name] is a 33 y.o. [phone] female at [redacted]w[redacted]d with an Estimated Date of Delivery: 09/18/19 being seen today for ongoing management of a high-risk pregnancy complicated by chronic hypertension currently on labetalol 200 mg. Hx of opiate addiction. [facility].  Hx of PTB @ 35 weeks. Today she reports no complaints. She had felt baby move but hasn't felt baby move in a week. She hasn't fel "pregnant"  .  .   . denies leaking of fluid.  Review of Systems:   Pertinent items are noted in HPI Denies abnormal vaginal discharge w/ itching/odor/irritation, headaches, visual changes, shortness of breath, chest pain, abdominal pain, severe nausea/vomiting, or problems with urination or bowel movements unless otherwise stated above. Pertinent History Reviewed:  Reviewed past medical,surgical, social, obstetrical and family history.  Reviewed problem list, medications and allergies. Physical Assessment:   Vitals:   04/03/19 1017  BP: 100/62  Pulse: 82  Weight: 194 lb 3.2 oz (88.1 kg)  Body mass index is 31.34 kg/m.           Physical Examination:   General appearance: alert, well appearing, and in no distress  Mental status: alert, oriented to person, place, and time  Skin: warm & dry   Extremities: Edema: None    Cardiovascular: normal heart rate noted  Respiratory: normal respiratory effort, no distress  Abdomen: gravid, soft, non-tender  Pelvic: Cervical exam deferred         Fetal Status: Fetal Heart Rate (bpm): 154 Fundal Height: 16 cm      Fetal Surveillance Testing today: None   Chaperone: Samul Dada    Results for orders placed or performed in  visit on 04/03/19 (from the past 24 hour(s))  POC Urinalysis Dipstick OB   Collection Time: 04/03/19 10:22 AM  Result Value Ref Range   Color, UA     Clarity, UA     Glucose, UA Negative Negative   Bilirubin, UA     Ketones, UA neg    Spec Grav, UA     Blood, UA neg    pH, UA     POC,PROTEIN,UA Trace Negative, Trace, Small (1+), Moderate (2+), Large (3+), 4+   Urobilinogen, UA     Nitrite, UA neg    Leukocytes, UA Negative Negative   Appearance     Odor      Assessment & Plan:  1) High-risk pregnancy G3P1102 at [redacted]w[redacted]d with an Estimated Date of Delivery: 09/18/19   2) CHTN, stable  3) hx opiate addiction, stable  4) Hx PTB @ 35 weeks, stable, on 17-P  Meds: No orders of the defined types were placed in this encounter.   Labs/procedures today: 17p, 2nd IT  Treatment Plan:  F/u in 4 weeks  Follow-up: Return in about 4 weeks (around 05/01/2019).  Orders Placed This Encounter  Procedures  . Urine Culture  . INTEGRATED 2  . POC Urinalysis Dipstick OB   By signing my name below, I, Samul Dada, attest that this documentation has been prepared under the direction and in the presence of Jonnie Kind, MD. Electronically Signed: [place]. 04/03/19.  10:39 AM.  I personally performed the services described in this documentation, which was SCRIBED in my presence. The recorded information has been reviewed and considered accurate. It has been edited as necessary during review. Jonnie Kind, MD

## 2019-04-03 NOTE — Addendum Note (Signed)
Addended by: Diona Fanti A on: 04/03/2019 11:16 AM   Modules accepted: Orders

## 2019-04-05 LAB — INTEGRATED 2
AFP MoM: 1.64
Alpha-Fetoprotein: 42.6 ng/mL
Crown Rump Length: 58.8 mm
DIA MoM: 1.55
DIA Value: 220.9 pg/mL
Estriol, Unconjugated: 0.81 ng/mL
Gest. Age on Collection Date: 12.1 weeks
Gestational Age: 16 weeks
Maternal Age at EDD: 32.9 yr
Nuchal Translucency (NT): 1.1 mm
Nuchal Translucency MoM: 0.84
Number of Fetuses: 1
PAPP-A MoM: 1.08
PAPP-A Value: 645.7 ng/mL
Test Results:: NEGATIVE
Weight: 194 [lb_av]
Weight: 194 [lb_av]
hCG MoM: 0.58
hCG Value: 17.9 IU/mL
uE3 MoM: 0.93

## 2019-04-05 LAB — URINE CULTURE

## 2019-04-10 ENCOUNTER — Ambulatory Visit: Payer: Medicaid Other

## 2019-04-10 ENCOUNTER — Other Ambulatory Visit: Payer: Self-pay

## 2019-04-10 ENCOUNTER — Ambulatory Visit (INDEPENDENT_AMBULATORY_CARE_PROVIDER_SITE_OTHER): Payer: Medicaid Other | Admitting: *Deleted

## 2019-04-10 VITALS — BP 138/96 | HR 81 | Wt 192.4 lb

## 2019-04-10 DIAGNOSIS — Z1389 Encounter for screening for other disorder: Secondary | ICD-10-CM

## 2019-04-10 DIAGNOSIS — O09212 Supervision of pregnancy with history of pre-term labor, second trimester: Secondary | ICD-10-CM | POA: Diagnosis not present

## 2019-04-10 DIAGNOSIS — Z3A17 17 weeks gestation of pregnancy: Secondary | ICD-10-CM

## 2019-04-10 DIAGNOSIS — Z331 Pregnant state, incidental: Secondary | ICD-10-CM

## 2019-04-10 DIAGNOSIS — O09899 Supervision of other high risk pregnancies, unspecified trimester: Secondary | ICD-10-CM

## 2019-04-10 LAB — POCT URINALYSIS DIPSTICK OB
Blood, UA: NEGATIVE
Glucose, UA: NEGATIVE
Ketones, UA: NEGATIVE
Leukocytes, UA: NEGATIVE
Nitrite, UA: NEGATIVE
POC,PROTEIN,UA: NEGATIVE

## 2019-04-10 MED ORDER — HYDROXYPROGESTERONE CAPROATE 275 MG/1.1ML ~~LOC~~ SOAJ
275.0000 mg | Freq: Once | SUBCUTANEOUS | Status: AC
  Administered 2019-04-10: 275 mg via SUBCUTANEOUS

## 2019-04-10 NOTE — Progress Notes (Signed)
   NURSE VISIT- INJECTION  SUBJECTIVE:  Brianna Hughes is a 33 y.o. [phone] female here for a Makena for history of preterm birth. She is [redacted]w[redacted]d pregnant.   OBJECTIVE:  BP (!) 138/96   Pulse 81   Wt 192 lb 6.4 oz (87.3 kg)   LMP 12/22/2018 (Within Weeks)   BMI 31.05 kg/m   Appears well, in no apparent distress  Injection administered in: Left arm  Meds ordered this encounter  Medications  . HYDROXYprogesterone Caproate SOAJ 275 mg    ASSESSMENT: Pregnancy [redacted]w[redacted]d Makena for history of preterm birth PLAN: Follow-up: in 1 week for next Raeford Razor  04/10/2019 3:07 PM

## 2019-04-18 ENCOUNTER — Ambulatory Visit (INDEPENDENT_AMBULATORY_CARE_PROVIDER_SITE_OTHER): Payer: Medicaid Other

## 2019-04-18 ENCOUNTER — Other Ambulatory Visit: Payer: Self-pay

## 2019-04-18 ENCOUNTER — Encounter: Payer: Self-pay | Admitting: Advanced Practice Midwife

## 2019-04-18 ENCOUNTER — Ambulatory Visit (INDEPENDENT_AMBULATORY_CARE_PROVIDER_SITE_OTHER): Payer: Medicaid Other | Admitting: Advanced Practice Midwife

## 2019-04-18 VITALS — BP 136/90 | HR 70 | Wt 193.5 lb

## 2019-04-18 DIAGNOSIS — Z3A18 18 weeks gestation of pregnancy: Secondary | ICD-10-CM | POA: Diagnosis not present

## 2019-04-18 DIAGNOSIS — O0990 Supervision of high risk pregnancy, unspecified, unspecified trimester: Secondary | ICD-10-CM

## 2019-04-18 DIAGNOSIS — Z363 Encounter for antenatal screening for malformations: Secondary | ICD-10-CM | POA: Diagnosis not present

## 2019-04-18 DIAGNOSIS — R8271 Bacteriuria: Secondary | ICD-10-CM

## 2019-04-18 DIAGNOSIS — O99891 Other specified diseases and conditions complicating pregnancy: Secondary | ICD-10-CM

## 2019-04-18 DIAGNOSIS — O09892 Supervision of other high risk pregnancies, second trimester: Secondary | ICD-10-CM

## 2019-04-18 DIAGNOSIS — O09212 Supervision of pregnancy with history of pre-term labor, second trimester: Secondary | ICD-10-CM

## 2019-04-18 DIAGNOSIS — O0992 Supervision of high risk pregnancy, unspecified, second trimester: Secondary | ICD-10-CM

## 2019-04-18 DIAGNOSIS — Z331 Pregnant state, incidental: Secondary | ICD-10-CM

## 2019-04-18 DIAGNOSIS — O0991 Supervision of high risk pregnancy, unspecified, first trimester: Secondary | ICD-10-CM

## 2019-04-18 DIAGNOSIS — Z1389 Encounter for screening for other disorder: Secondary | ICD-10-CM

## 2019-04-18 LAB — POCT URINALYSIS DIPSTICK OB
Blood, UA: NEGATIVE
Glucose, UA: NEGATIVE
Ketones, UA: NEGATIVE
Nitrite, UA: NEGATIVE

## 2019-04-18 MED ORDER — HYDROXYPROGESTERONE CAPROATE 275 MG/1.1ML ~~LOC~~ SOAJ
275.0000 mg | Freq: Once | SUBCUTANEOUS | Status: AC
  Administered 2019-04-18: 275 mg via SUBCUTANEOUS

## 2019-04-18 NOTE — Progress Notes (Signed)
HIGH-RISK PREGNANCY VISIT Patient name: Brianna Hughes MRN [id number]  Date of birth: July 12, 1985 Chief Complaint:   High Risk Gestation ([place] & 17P today)  History of Present Illness:   Brianna Hughes is a 33 y.o. [phone] female at [redacted]w[redacted]d with an Estimated Date of Delivery: 09/18/19 being seen today for ongoing management of a high-risk pregnancy complicated by chronic hypertension currently on Lab 200mg  bid (has taken only when BP elevated- now states she will take bid).  Subutex 8mg - takes 2-2.5 qd.  Hx PTB @ 35wks- getting 17P weekly. Today she reports no complaints. Contractions: Not present. Vag. Bleeding: None.  Movement: Present. denies leaking of fluid.  Review of Systems:   Pertinent items are noted in HPI Denies abnormal vaginal discharge w/ itching/odor/irritation, headaches, visual changes, shortness of breath, chest pain, abdominal pain, severe nausea/vomiting, or problems with urination or bowel movements unless otherwise stated above. Pertinent History Reviewed:  Reviewed past medical,surgical, social, obstetrical and family history.  Reviewed problem list, medications and allergies. Physical Assessment:   Vitals:   04/18/19 1418  BP: 136/90  Pulse: 70  Weight: 193 lb 8 oz (87.8 kg)  Body mass index is 31.23 kg/m.           Physical Examination:   General appearance: alert, well appearing, and in no distress  Mental status: alert, oriented to person, place, and time  Skin: warm & dry   Extremities: Edema: None    Cardiovascular: normal heart rate noted  Respiratory: normal respiratory effort, no distress  Abdomen: gravid, soft, non-tender  Pelvic: Cervical exam deferred         Fetal Status: Fetal Heart Rate (bpm): 150 u/s   Movement: Present    Fetal Surveillance Testing today:  [place] 18+1 wks,breech,anterior placenta gr 0,fhr 150 bpm,cx 3.7 cm,normal ovaries,svp of fluid 3.8 cm,efw 231 g 51%,anatomy complete,no obvious abnormalities    Chaperone: n/a    Results for  orders placed or performed in visit on 04/18/19 (from the past 24 hour(s))  POC Urinalysis Dipstick OB   Collection Time: 04/18/19  2:19 PM  Result Value Ref Range   Color, UA     Clarity, UA     Glucose, UA Negative Negative   Bilirubin, UA     Ketones, UA neg    Spec Grav, UA     Blood, UA neg    pH, UA     POC,PROTEIN,UA Trace Negative, Trace, Small (1+), Moderate (2+), Large (3+), 4+   Urobilinogen, UA     Nitrite, UA neg    Leukocytes, UA Trace (A) Negative   Appearance     Odor      Assessment & Plan:  1) High-risk pregnancy G3P1102 at [redacted]w[redacted]d with an Estimated Date of Delivery: 09/18/19   2) cHTN, prescribed Lab 200mg  bid but has only been taking when BP is high- encouraged to take as scheduled; will need growth scans q 4wks  3) Hx opiate addiction, stable on Subutex  4) Hx PTB @ 35wks, weekly 17P  5) Considering ppBTL, to discuss salpingectomy vs [place]/Filschie clips at next visit  Meds:  Meds ordered this encounter  Medications  . HYDROXYprogesterone Caproate SOAJ 275 mg    Labs/procedures today: anatomy U/S  Reviewed: Preterm labor symptoms and general obstetric precautions including but not limited to vaginal bleeding, contractions, leaking of fluid and fetal movement were reviewed in detail with the patient.  All questions were answered. Has home bp cuff. Check bp weekly, let us know  if >140/90.   Follow-up: Return in about 4 weeks (around 05/16/2019) for HROB with MD; needs weekly Makena, Weekly 17P.  Orders Placed This Encounter  Procedures  . POC Urinalysis Dipstick OB   Myrtis [facility] 04/18/2019 2:45 PM

## 2019-04-18 NOTE — Patient Instructions (Signed)
Brianna Hughes, I greatly value your feedback.  If you receive a survey following your visit with [place] today, we appreciate you taking the time to fill it out.  Thanks, Brianna Hughes, Brianna Hughes!!! It is now [facility] ([place], [place] [address]) Entrance located off of [place] parking   Go to [email or web address] to register for FREE online childbirth classes  [place] [facility]ians/Family Doctors:  [facility] [place] [phone]                 [place] [phone] (usually not accepting new patients unless you have family there already, you are always welcome to call and ask)       [facility] Department [phone]       [facility] Pediatricians/Family Doctors:   [facility]: [phone]  [facility]: [phone]  [facility]: [place] Doctors:   [facility]: [facility]: [place]:  [place]: [phone]    Home Blood Pressure Monitoring for Patients   Your provider has recommended that you check your blood pressure (BP) at least once a week at home. If you do not have a blood pressure cuff at home, one will be provided for you. Contact your provider if you have not received your monitor within 1 week.   Helpful Tips for Accurate Home Blood Pressure Checks   Don't smoke, exercise, or drink caffeine 30 minutes before checking your BP  Use the restroom before checking your BP (a full bladder can raise your pressure)  Relax in a comfortable upright chair  Feet on the ground  Left arm resting comfortably on a flat surface at the level of your heart  Legs uncrossed  Back supported  Sit quietly and don't talk  Place the cuff on your bare  arm  Adjust snuggly, so that only two fingertips can fit between your skin and the top of the cuff  Check 2 readings separated by at least one minute  Keep a log of your BP readings  For a visual, please reference this diagram: http://ccnc.care/bpdiagram  Provider Name: [facility]     Phone: [phone]  Zone 1: ALL CLEAR  Continue to monitor your symptoms:   BP reading is less than 140 (top number) or less than 90 (bottom number)   No right upper stomach pain  No headaches or seeing spots  No feeling nauseated or throwing up  No swelling in face and hands  Zone 2: CAUTION Call your doctor's office for any of the following:   BP reading is greater than 140 (top number) or greater than 90 (bottom number)   Stomach pain under your ribs in the middle or right side  Headaches or seeing spots  Feeling nauseated or throwing up  Swelling in face and hands  Zone 3: EMERGENCY  Seek immediate medical care if you have any of the following:   BP reading is greater than160 (top number) or greater than 110 (bottom number)  Severe headaches not improving with Tylenol  Serious difficulty catching your breath  Any worsening symptoms from Zone 2     Second Trimester of Pregnancy The second trimester is from week 14 through week 27 (months 4 through 6). The second trimester is often  a time when you feel your best. Your body has adjusted to being pregnant, and you begin to feel better physically. Usually, morning sickness has lessened or quit completely, you may have more energy, and you may have an increase in appetite. The second trimester is also a time when the fetus is growing rapidly. At the end of the sixth month, the fetus is about 9 inches long and weighs about 1 pounds. You will likely begin to feel the baby move (quickening) between 16 and 20 weeks of pregnancy. Body changes during your second trimester Your body continues to go through many changes during your  second trimester. The changes vary from woman to woman.  Your weight will continue to increase. You will notice your lower abdomen bulging out.  You may begin to get stretch marks on your hips, abdomen, and breasts.  You may develop headaches that can be relieved by medicines. The medicines should be approved by your health care provider.  You may urinate more often because the fetus is pressing on your bladder.  You may develop or continue to have heartburn as a result of your pregnancy.  You may develop constipation because certain hormones are causing the muscles that push waste through your intestines to slow down.  You may develop hemorrhoids or swollen, bulging veins (varicose veins).  You may have back pain. This is caused by: ? Weight gain. ? Pregnancy hormones that are relaxing the joints in your pelvis. ? A shift in weight and the muscles that support your balance.  Your breasts will continue to grow and they will continue to become tender.  Your gums may bleed and may be sensitive to brushing and flossing.  Dark spots or blotches (chloasma, mask of pregnancy) may develop on your face. This will likely fade after the baby is born.  A dark line from your belly button to the pubic area (linea nigra) may appear. This will likely fade after the baby is born.  You may have changes in your hair. These can include thickening of your hair, rapid growth, and changes in texture. Some women also have hair loss during or after pregnancy, or hair that feels dry or thin. Your hair will most likely return to normal after your baby is born.  What to expect at prenatal visits During a routine prenatal visit:  You will be weighed to make sure you and the fetus are growing normally.  Your blood pressure will be taken.  Your abdomen will be measured to track your baby's growth.  The fetal heartbeat will be listened to.  Any test results from the previous visit will be  discussed.  Your health care provider may ask you:  How you are feeling.  If you are feeling the baby move.  If you have had any abnormal symptoms, such as leaking fluid, bleeding, severe headaches, or abdominal cramping.  If you are using any tobacco products, including cigarettes, chewing tobacco, and electronic cigarettes.  If you have any questions.  Other tests that may be performed during your second trimester include:  Blood tests that check for: ? Low iron levels (anemia). ? High blood sugar that affects pregnant women (gestational diabetes) between 77 and 28 weeks. ? Rh antibodies. This is to check for a protein on red blood cells (Rh factor).  Urine tests to check for infections, diabetes, or protein in the urine.  An ultrasound to confirm the proper growth and development of the baby.  An amniocentesis to check  for possible genetic problems.  Fetal screens for spina bifida and Down syndrome.  HIV (human immunodeficiency virus) testing. Routine prenatal testing includes screening for HIV, unless you choose not to have this test.  Follow these instructions at home: Medicines  Follow your health care provider's instructions regarding medicine use. Specific medicines may be either safe or unsafe to take during pregnancy.  Take a prenatal vitamin that contains at least 600 micrograms (mcg) of folic acid.  If you develop constipation, try taking a stool softener if your health care provider approves. Eating and drinking  Eat a balanced diet that includes fresh fruits and vegetables, whole grains, good sources of protein such as meat, eggs, or tofu, and low-fat dairy. Your health care provider will help you determine the amount of weight gain that is right for you.  Avoid raw meat and uncooked cheese. These carry germs that can cause birth defects in the baby.  If you have low calcium intake from food, talk to your health care provider about whether you should take a  daily calcium supplement.  Limit foods that are high in fat and processed sugars, such as fried and sweet foods.  To prevent constipation: ? Drink enough fluid to keep your urine clear or pale yellow. ? Eat foods that are high in fiber, such as fresh fruits and vegetables, whole grains, and beans. Activity  Exercise only as directed by your health care provider. Most women can continue their usual exercise routine during pregnancy. Try to exercise for 30 minutes at least 5 days a week. Stop exercising if you experience uterine contractions.  Avoid heavy lifting, wear low heel shoes, and practice good posture.  A sexual relationship may be continued unless your health care provider directs you otherwise. Relieving pain and discomfort  Wear a good support bra to prevent discomfort from breast tenderness.  Take warm sitz baths to soothe any pain or discomfort caused by hemorrhoids. Use hemorrhoid cream if your health care provider approves.  Rest with your legs elevated if you have leg cramps or low back pain.  If you develop varicose veins, wear support hose. Elevate your feet for 15 minutes, 3-4 times a day. Limit salt in your diet. Prenatal Care  Write down your questions. Take them to your prenatal visits.  Keep all your prenatal visits as told by your health care provider. This is important. Safety  Wear your seat belt at all times when driving.  Make a list of emergency phone numbers, including numbers for family, friends, the hospital, and police and fire departments. General instructions  Ask your health care provider for a referral to a local prenatal education class. Begin classes no later than the beginning of month 6 of your pregnancy.  Ask for help if you have counseling or nutritional needs during pregnancy. Your health care provider can offer advice or refer you to specialists for help with various needs.  Do not use hot tubs, steam rooms, or saunas.  Do not  douche or use tampons or scented sanitary pads.  Do not cross your legs for long periods of time.  Avoid cat litter boxes and soil used by cats. These carry germs that can cause birth defects in the baby and possibly loss of the fetus by miscarriage or stillbirth.  Avoid all smoking, herbs, alcohol, and unprescribed drugs. Chemicals in these products can affect the formation and growth of the baby.  Do not use any products that contain nicotine or tobacco, such as cigarettes  and e-cigarettes. If you need help quitting, ask your health care provider.  Visit your dentist if you have not gone yet during your pregnancy. Use a soft toothbrush to brush your teeth and be gentle when you floss. Contact a health care provider if:  You have dizziness.  You have mild pelvic cramps, pelvic pressure, or nagging pain in the abdominal area.  You have persistent nausea, vomiting, or diarrhea.  You have a bad smelling vaginal discharge.  You have pain when you urinate. Get help right away if:  You have a fever.  You are leaking fluid from your vagina.  You have spotting or bleeding from your vagina.  You have severe abdominal cramping or pain.  You have rapid weight gain or weight loss.  You have shortness of breath with chest pain.  You notice sudden or extreme swelling of your face, hands, ankles, feet, or legs.  You have not felt your baby move in over an hour.  You have severe headaches that do not go away when you take medicine.  You have vision changes. Summary  The second trimester is from week 14 through week 27 (months 4 through 6). It is also a time when the fetus is growing rapidly.  Your body goes through many changes during pregnancy. The changes vary from woman to woman.  Avoid all smoking, herbs, alcohol, and unprescribed drugs. These chemicals affect the formation and growth your baby.  Do not use any tobacco products, such as cigarettes, chewing tobacco, and  e-cigarettes. If you need help quitting, ask your health care provider.  Contact your health care provider if you have any questions. Keep all prenatal visits as told by your health care provider. This is important. This information is not intended to replace advice given to you by your health care provider. Make sure you discuss any questions you have with your health care provider. Document Released: 04/20/2001 Document Revised: 10/02/2015 Document Reviewed: 06/27/2012 Elsevier Interactive Patient Education  2017 [place] FLU! Because you are pregnant, we at [facility], along with the Centers for Disease Control (CDC), recommend that you receive the flu vaccine to protect yourself and your baby from the flu. The flu is more likely to cause severe illness in pregnant women than in women of reproductive age who are not pregnant. Changes in the immune system, heart, and lungs during pregnancy make pregnant women (and women up to two weeks postpartum) more prone to severe illness from flu, including illness resulting in hospitalization. Flu also may be harmful for a pregnant womans developing baby. A common flu symptom is fever, which may be associated with neural tube defects and other adverse outcomes for a developing baby. Getting vaccinated can also help protect a baby after birth from flu. (Mom passes antibodies onto the developing baby during her pregnancy.)  A Flu Vaccine is the Best Protection Against Flu Getting a flu vaccine is the first and most important step in protecting against flu. Pregnant women should get a flu shot and not the live attenuated influenza vaccine (LAIV), also known as nasal spray flu vaccine. Flu vaccines given during pregnancy help protect both the mother and her baby from flu. Vaccination has been shown to reduce the risk of flu-associated acute respiratory infection in pregnant women by up to one-half. A 2018 study showed  that getting a flu shot reduced a pregnant womans risk of being hospitalized with flu by an average of 40  percent. Pregnant women who get a flu vaccine are also helping to protect their babies from flu illness for the first several months after their birth, when they are too young to get vaccinated.   A Long Record of Safety for Flu Shots in Pregnant Women Flu shots have been given to millions of pregnant women over many years with a good safety record. There is a lot of evidence that flu vaccines can be given safely during pregnancy; though these data are limited for the first trimester. The CDC recommends that pregnant women get vaccinated during any trimester of their pregnancy. It is very important for pregnant women to get the flu shot.   Other Preventive Actions In addition to getting a flu shot, pregnant women should take the same everyday preventive actions the CDC recommends of everyone, including covering coughs, washing hands often, and avoiding people who are sick.  Symptoms and Treatment If you get sick with flu symptoms call your doctor right away. There are antiviral drugs that can treat flu illness and prevent serious flu complications. The CDC recommends prompt treatment for people who have influenza infection or suspected influenza infection and who are at high risk of serious flu complications, such as people with asthma, diabetes (including gestational diabetes), or heart disease. Early treatment of influenza in hospitalized pregnant women has been shown to reduce the length of the hospital stay.  Symptoms Flu symptoms include fever, cough, sore throat, runny or stuffy nose, body aches, headache, chills and fatigue. Some people may also have vomiting and diarrhea. People may be infected with the flu and have respiratory symptoms without a fever.  Early Treatment is Important for Pregnant Women Treatment should begin as soon as possible because antiviral drugs work best when  started early (within 48 hours after symptoms start). Antiviral drugs can make your flu illness milder and make you feel better faster. They may also prevent serious health problems that can result from flu illness. Oral oseltamivir (Tamiflu) is the preferred treatment for pregnant women because it has the most studies available to suggest that it is safe and beneficial. Antiviral drugs require a prescription from your provider. Having a fever caused by flu infection or other infections early in pregnancy may be linked to birth defects in a baby. In addition to taking antiviral drugs, pregnant women who get a fever should treat their fever with Tylenol (acetaminophen) and contact their provider immediately.  When to [place] If you are pregnant and have any of these signs, seek care immediately:  Difficulty breathing or shortness of breath  Pain or pressure in the chest or abdomen  Sudden dizziness  Confusion  Severe or persistent vomiting  High fever that is not responding to Tylenol (or store brand equivalent)  Decreased or no movement of your baby  [email or web address]

## 2019-04-18 NOTE — Progress Notes (Signed)
[place] 18+1 wks,breech,anterior placenta gr 0,fhr 150 bpm,cx 3.7 cm,normal ovaries,svp of fluid 3.8 cm,efw 231 g 51%,anatomy complete,no obvious abnormalities

## 2019-04-21 ENCOUNTER — Other Ambulatory Visit: Payer: Self-pay

## 2019-04-23 ENCOUNTER — Other Ambulatory Visit: Payer: Self-pay

## 2019-04-23 ENCOUNTER — Other Ambulatory Visit: Payer: Self-pay | Admitting: Advanced Practice Midwife

## 2019-04-23 MED ORDER — PROMETHAZINE HCL 25 MG PO TABS: 12.5000 mg | ORAL_TABLET | Freq: Four times a day (QID) | ORAL | 0 refills | Status: DC | PRN

## 2019-04-24 ENCOUNTER — Other Ambulatory Visit: Payer: Self-pay

## 2019-04-24 ENCOUNTER — Ambulatory Visit (INDEPENDENT_AMBULATORY_CARE_PROVIDER_SITE_OTHER): Payer: Medicaid Other | Admitting: *Deleted

## 2019-04-24 ENCOUNTER — Other Ambulatory Visit: Payer: Self-pay | Admitting: Women's Health

## 2019-04-24 ENCOUNTER — Encounter: Payer: Self-pay | Admitting: *Deleted

## 2019-04-24 VITALS — BP 117/72 | HR 72 | Ht 66.0 in | Wt 193.5 lb

## 2019-04-24 DIAGNOSIS — Z1389 Encounter for screening for other disorder: Secondary | ICD-10-CM

## 2019-04-24 DIAGNOSIS — O09212 Supervision of pregnancy with history of pre-term labor, second trimester: Secondary | ICD-10-CM | POA: Diagnosis not present

## 2019-04-24 DIAGNOSIS — O09892 Supervision of other high risk pregnancies, second trimester: Secondary | ICD-10-CM

## 2019-04-24 DIAGNOSIS — Z3A19 19 weeks gestation of pregnancy: Secondary | ICD-10-CM

## 2019-04-24 DIAGNOSIS — Z331 Pregnant state, incidental: Secondary | ICD-10-CM

## 2019-04-24 LAB — POCT URINALYSIS DIPSTICK OB
Blood, UA: NEGATIVE
Glucose, UA: NEGATIVE
Ketones, UA: NEGATIVE
Leukocytes, UA: NEGATIVE
Nitrite, UA: NEGATIVE

## 2019-04-24 MED ORDER — HYDROXYPROGESTERONE CAPROATE 275 MG/1.1ML ~~LOC~~ SOAJ
275.0000 mg | Freq: Once | SUBCUTANEOUS | Status: AC
  Administered 2019-04-24: 14:00:00 275 mg via SUBCUTANEOUS

## 2019-04-24 MED ORDER — PROMETHAZINE HCL 25 MG PO TABS: 12.5000 mg | ORAL_TABLET | Freq: Four times a day (QID) | ORAL | 4 refills | Status: DC | PRN

## 2019-04-24 NOTE — Progress Notes (Signed)
   NURSE VISIT- INJECTION  SUBJECTIVE:  Brianna Hughes is a 33 y.o. [phone] female here for a Makena for history of preterm birth. She is [redacted]w[redacted]d pregnant.   OBJECTIVE:  BP 117/72 (BP Location: Left Arm, Patient Position: Sitting, Cuff Size: Normal)   Pulse 72   Ht 5\' 6"  (1.676 m)   Wt 193 lb 8 oz (87.8 kg)   LMP 12/22/2018 (Within Weeks)   BMI 31.23 kg/m   Appears well, in no apparent distress  Injection administered in: Left arm  Meds ordered this encounter  Medications  . HYDROXYprogesterone Caproate SOAJ 275 mg    ASSESSMENT: Pregnancy [redacted]w[redacted]d Makena for history of preterm birth PLAN: Follow-up: in 1 week for next Alric Ran  04/24/2019 1:56 PM

## 2019-05-01 ENCOUNTER — Ambulatory Visit (INDEPENDENT_AMBULATORY_CARE_PROVIDER_SITE_OTHER): Payer: Medicaid Other | Admitting: *Deleted

## 2019-05-01 ENCOUNTER — Encounter: Payer: Self-pay | Admitting: *Deleted

## 2019-05-01 ENCOUNTER — Other Ambulatory Visit: Payer: Self-pay

## 2019-05-01 VITALS — BP 126/86 | HR 94 | Ht 66.0 in | Wt 193.6 lb

## 2019-05-01 DIAGNOSIS — Z331 Pregnant state, incidental: Secondary | ICD-10-CM

## 2019-05-01 DIAGNOSIS — Z3A20 20 weeks gestation of pregnancy: Secondary | ICD-10-CM | POA: Diagnosis not present

## 2019-05-01 DIAGNOSIS — O09212 Supervision of pregnancy with history of pre-term labor, second trimester: Secondary | ICD-10-CM

## 2019-05-01 DIAGNOSIS — Z1389 Encounter for screening for other disorder: Secondary | ICD-10-CM

## 2019-05-01 DIAGNOSIS — O09892 Supervision of other high risk pregnancies, second trimester: Secondary | ICD-10-CM

## 2019-05-01 LAB — POCT URINALYSIS DIPSTICK OB
Ketones, UA: NEGATIVE
Nitrite, UA: NEGATIVE

## 2019-05-01 MED ORDER — HYDROXYPROGESTERONE CAPROATE 275 MG/1.1ML ~~LOC~~ SOAJ
275.0000 mg | Freq: Once | SUBCUTANEOUS | Status: AC
  Administered 2019-05-01: 14:00:00 275 mg via SUBCUTANEOUS

## 2019-05-01 NOTE — Progress Notes (Signed)
   NURSE VISIT- INJECTION  SUBJECTIVE:  Brianna Hughes is a 33 y.o. [phone] female here for a Makena for history of preterm birth. She is [redacted]w[redacted]d pregnant.   OBJECTIVE:  BP 126/86 (BP Location: Right Arm, Patient Position: Sitting, Cuff Size: Normal)   Pulse 94   Ht 5\' 6"  (1.676 m)   Wt 193 lb 9.6 oz (87.8 kg)   LMP 12/22/2018 (Within Weeks)   BMI 31.25 kg/m   Appears well, in no apparent distress  Injection administered in: Right arm  Meds ordered this encounter  Medications  . HYDROXYprogesterone Caproate SOAJ 275 mg    ASSESSMENT: Pregnancy [redacted]w[redacted]d Makena for history of preterm birth PLAN: Follow-up: in 1 week for next Alric Ran  05/01/2019 2:03 PM

## 2019-05-08 ENCOUNTER — Other Ambulatory Visit: Payer: Self-pay

## 2019-05-08 ENCOUNTER — Ambulatory Visit: Payer: Medicaid Other

## 2019-05-08 VITALS — BP 117/81 | HR 86 | Ht 66.0 in | Wt 193.0 lb

## 2019-05-08 DIAGNOSIS — Z1389 Encounter for screening for other disorder: Secondary | ICD-10-CM

## 2019-05-08 DIAGNOSIS — O09892 Supervision of other high risk pregnancies, second trimester: Secondary | ICD-10-CM

## 2019-05-08 DIAGNOSIS — O0990 Supervision of high risk pregnancy, unspecified, unspecified trimester: Secondary | ICD-10-CM

## 2019-05-08 DIAGNOSIS — Z331 Pregnant state, incidental: Secondary | ICD-10-CM

## 2019-05-08 LAB — POCT URINALYSIS DIPSTICK OB
Blood, UA: NEGATIVE
Glucose, UA: NEGATIVE
Ketones, UA: NEGATIVE
Nitrite, UA: NEGATIVE

## 2019-05-08 MED ORDER — HYDROXYPROGESTERONE CAPROATE 275 MG/1.1ML ~~LOC~~ SOAJ
275.0000 mg | Freq: Once | SUBCUTANEOUS | Status: AC
  Administered 2019-05-08: 15:00:00 275 mg via SUBCUTANEOUS

## 2019-05-08 NOTE — Progress Notes (Signed)
   NURSE VISIT- INJECTION  SUBJECTIVE:  Brianna Hughes is a 33 y.o. [phone] female here for a Makena for history of preterm birth. She [redacted] weeks pregnant.   OBJECTIVE:  BP 117/81   Pulse 86   Ht 5\' 6"  (1.676 m)   Wt 193 lb (87.5 kg)   LMP 12/22/2018 (Within Weeks)   BMI 31.15 kg/m   Appears well, in no apparent distress  Injection administered in: Left deltoid  Meds ordered this encounter  Medications  . HYDROXYprogesterone Caproate SOAJ 275 mg    ASSESSMENT: Pregnancy [redacted]w[redacted]d Makena for history of preterm delivery PLAN: Follow-up: in 1 week for next [place]  05/08/2019 3:20 PM

## 2019-05-17 ENCOUNTER — Ambulatory Visit (INDEPENDENT_AMBULATORY_CARE_PROVIDER_SITE_OTHER): Payer: Medicaid Other | Admitting: Obstetrics & Gynecology

## 2019-05-17 ENCOUNTER — Encounter: Payer: Self-pay | Admitting: Obstetrics & Gynecology

## 2019-05-17 ENCOUNTER — Other Ambulatory Visit: Payer: Self-pay

## 2019-05-17 VITALS — BP 99/68 | HR 88 | Wt 192.0 lb

## 2019-05-17 DIAGNOSIS — O0990 Supervision of high risk pregnancy, unspecified, unspecified trimester: Secondary | ICD-10-CM

## 2019-05-17 DIAGNOSIS — O09212 Supervision of pregnancy with history of pre-term labor, second trimester: Secondary | ICD-10-CM

## 2019-05-17 DIAGNOSIS — Z3A22 22 weeks gestation of pregnancy: Secondary | ICD-10-CM

## 2019-05-17 DIAGNOSIS — Z331 Pregnant state, incidental: Secondary | ICD-10-CM

## 2019-05-17 DIAGNOSIS — Z1389 Encounter for screening for other disorder: Secondary | ICD-10-CM

## 2019-05-17 DIAGNOSIS — Z5181 Encounter for therapeutic drug level monitoring: Secondary | ICD-10-CM

## 2019-05-17 LAB — POCT URINALYSIS DIPSTICK OB
Blood, UA: NEGATIVE
Glucose, UA: NEGATIVE
Leukocytes, UA: NEGATIVE
Nitrite, UA: NEGATIVE

## 2019-05-17 MED ORDER — HYDROXYPROGESTERONE CAPROATE 275 MG/1.1ML ~~LOC~~ SOAJ
275.0000 mg | SUBCUTANEOUS | Status: AC
  Administered 2019-05-17 – 2019-08-17 (×6): 275 mg via SUBCUTANEOUS

## 2019-05-17 MED ORDER — TERCONAZOLE 0.4 % VA CREA: 1.0000 | TOPICAL_CREAM | Freq: Every day | VAGINAL | 0 refills | Status: DC

## 2019-05-17 NOTE — Progress Notes (Signed)
HIGH-RISK PREGNANCY VISIT Patient name: Brianna Hughes MRN [id number]  Date of birth: 1986-04-24 Chief Complaint:   Routine Prenatal Visit  History of Present Illness:   Brianna Hughes is a 34 y.o. G7P1102 female at [redacted]w[redacted]d with an Estimated Date of Delivery: 09/18/19 being seen today for ongoing management of a high-risk pregnancy complicated by chronic hypertension currently on labetalol 200 BID.  Today she reports low BP at home, she is not taking her BP meds but nce a day as a result. Contractions: Not present. Vag. Bleeding: None.  Movement: Present. denies leaking of fluid.  Review of Systems:   Pertinent items are noted in HPI Denies abnormal vaginal discharge w/ itching/odor/irritation, headaches, visual changes, shortness of breath, chest pain, abdominal pain, severe nausea/vomiting, or problems with urination or bowel movements unless otherwise stated above. Pertinent History Reviewed:  Reviewed past medical,surgical, social, obstetrical and family history.  Reviewed problem list, medications and allergies. Physical Assessment:   Vitals:   05/17/19 1458  BP: 99/68  Pulse: 88  Weight: 192 lb (87.1 kg)  Body mass index is 30.99 kg/m.           Physical Examination:   General appearance: alert, well appearing, and in no distress  Mental status: alert, oriented to person, place, and time  Skin: warm & dry   Extremities: Edema: Trace    Cardiovascular: normal heart rate noted  Respiratory: normal respiratory effort, no distress  Abdomen: gravid, soft, non-tender  Pelvic: Cervical exam deferred         Fetal Status: Fetal Heart Rate (bpm): 155 Fundal Height: 23 cm Movement: Present    Fetal Surveillance Testing today: 155   Chaperone: [place]    Results for orders placed or performed in visit on 05/17/19 (from the past 24 hour(s))  POC Urinalysis Dipstick OB   Collection Time: 05/17/19  3:02 PM  Result Value Ref Range   Color, UA     Clarity, UA     Glucose, UA  Negative Negative   Bilirubin, UA     Ketones, UA trace    Spec Grav, UA     Blood, UA n    pH, UA     POC,PROTEIN,UA Moderate (2+) Negative, Trace, Small (1+), Moderate (2+), Large (3+), 4+   Urobilinogen, UA     Nitrite, UA n    Leukocytes, UA Negative Negative   Appearance     Odor      Assessment & Plan:  1) High-risk pregnancy G3P1102 at [redacted]w[redacted]d with an Estimated Date of Delivery: 09/18/19   2) CHTN, stable, on labetalol 200 BID, she is taking it once and it is so low I am having her stop it for now and she will take BP 4 times daily and bring log so we can see where her BP is going  3) Pad/Yeast vulvitis, uses pads due to incontinence, use topical ticonazole cream daily and prn going forward, also recommend to use zinc oxide  Meds:  Meds ordered this encounter  Medications  . HYDROXYprogesterone caproate (Makena) autoinjector 275 mg  . terconazole (TERAZOL 7) 0.4 % vaginal cream    Sig: Place 1 applicator vaginally at bedtime.    Dispense:  45 g    Refill:  0    Labs/procedures today:   Treatment Plan:  As above, PN2 in 4 weeks  Reviewed: Term labor symptoms and general obstetric precautions including but not limited to vaginal bleeding, contractions, leaking of fluid and fetal movement were  reviewed in detail with the patient.  All questions were answered. Has home bp cuff. Rx faxed to . Check bp weekly, let us know if >140/90.   Follow-up: Return in about 4 weeks (around 06/14/2019) for weekly 17P, 4 weeks for PN2, OBV.  Orders Placed This Encounter  Procedures  . POC Urinalysis Dipstick OB   Mertie Clause Shabreka Coulon  05/17/2019 3:20 PM

## 2019-05-24 ENCOUNTER — Ambulatory Visit: Payer: Medicaid Other

## 2019-05-31 ENCOUNTER — Ambulatory Visit: Payer: Medicaid Other

## 2019-06-06 ENCOUNTER — Encounter: Payer: Self-pay | Admitting: *Deleted

## 2019-06-06 ENCOUNTER — Ambulatory Visit (INDEPENDENT_AMBULATORY_CARE_PROVIDER_SITE_OTHER): Payer: Medicaid Other | Admitting: *Deleted

## 2019-06-06 ENCOUNTER — Other Ambulatory Visit: Payer: Self-pay

## 2019-06-06 VITALS — BP 128/91 | HR 96 | Wt 195.0 lb

## 2019-06-06 DIAGNOSIS — Z331 Pregnant state, incidental: Secondary | ICD-10-CM

## 2019-06-06 DIAGNOSIS — O09212 Supervision of pregnancy with history of pre-term labor, second trimester: Secondary | ICD-10-CM

## 2019-06-06 DIAGNOSIS — Z3A25 25 weeks gestation of pregnancy: Secondary | ICD-10-CM | POA: Diagnosis not present

## 2019-06-06 DIAGNOSIS — O0990 Supervision of high risk pregnancy, unspecified, unspecified trimester: Secondary | ICD-10-CM

## 2019-06-06 DIAGNOSIS — Z1389 Encounter for screening for other disorder: Secondary | ICD-10-CM

## 2019-06-06 DIAGNOSIS — O09899 Supervision of other high risk pregnancies, unspecified trimester: Secondary | ICD-10-CM

## 2019-06-06 LAB — POCT URINALYSIS DIPSTICK OB
Blood, UA: NEGATIVE
Glucose, UA: NEGATIVE
Ketones, UA: NEGATIVE
Leukocytes, UA: NEGATIVE
Nitrite, UA: NEGATIVE

## 2019-06-06 NOTE — Progress Notes (Signed)
   NURSE VISIT- INJECTION  SUBJECTIVE:  Brianna Hughes is a 34 y.o. [phone] female here for a Makena for history of preterm birth. She is [redacted]w[redacted]d pregnant.   OBJECTIVE:  BP (!) 128/91   Pulse 96   Wt 195 lb (88.5 kg)   LMP 12/22/2018 (Within Weeks)   BMI 31.47 kg/m   Appears well, in no apparent distress  Injection administered in: Left arm  No orders of the defined types were placed in this encounter.   ASSESSMENT: Pregnancy [redacted]w[redacted]d Makena for history of preterm birth PLAN: Follow-up: in 65 week for next Clementeen Hoof  06/06/2019 10:06 AM

## 2019-06-07 ENCOUNTER — Ambulatory Visit: Payer: Medicaid Other

## 2019-06-13 ENCOUNTER — Other Ambulatory Visit: Payer: Medicaid Other

## 2019-06-13 ENCOUNTER — Ambulatory Visit (INDEPENDENT_AMBULATORY_CARE_PROVIDER_SITE_OTHER): Payer: Medicaid Other | Admitting: Advanced Practice Midwife

## 2019-06-13 ENCOUNTER — Other Ambulatory Visit: Payer: Self-pay

## 2019-06-13 VITALS — BP 139/97 | HR 102 | Wt 196.2 lb

## 2019-06-13 DIAGNOSIS — O09212 Supervision of pregnancy with history of pre-term labor, second trimester: Secondary | ICD-10-CM | POA: Diagnosis not present

## 2019-06-13 DIAGNOSIS — Z302 Encounter for sterilization: Secondary | ICD-10-CM | POA: Insufficient documentation

## 2019-06-13 DIAGNOSIS — Z331 Pregnant state, incidental: Secondary | ICD-10-CM

## 2019-06-13 DIAGNOSIS — Z1389 Encounter for screening for other disorder: Secondary | ICD-10-CM

## 2019-06-13 DIAGNOSIS — Z3A26 26 weeks gestation of pregnancy: Secondary | ICD-10-CM

## 2019-06-13 DIAGNOSIS — O09892 Supervision of other high risk pregnancies, second trimester: Secondary | ICD-10-CM

## 2019-06-13 DIAGNOSIS — O10919 Unspecified pre-existing hypertension complicating pregnancy, unspecified trimester: Secondary | ICD-10-CM

## 2019-06-13 DIAGNOSIS — O10912 Unspecified pre-existing hypertension complicating pregnancy, second trimester: Secondary | ICD-10-CM

## 2019-06-13 DIAGNOSIS — O09899 Supervision of other high risk pregnancies, unspecified trimester: Secondary | ICD-10-CM

## 2019-06-13 LAB — POCT URINALYSIS DIPSTICK OB
Blood, UA: NEGATIVE
Glucose, UA: NEGATIVE
Ketones, UA: NEGATIVE
Leukocytes, UA: NEGATIVE
Nitrite, UA: NEGATIVE
POC,PROTEIN,UA: NEGATIVE

## 2019-06-13 MED ORDER — LABETALOL HCL 100 MG PO TABS: 100.0000 mg | ORAL_TABLET | Freq: Two times a day (BID) | ORAL | 3 refills | Status: DC

## 2019-06-13 NOTE — Progress Notes (Signed)
HIGH-RISK PREGNANCY VISIT Patient name: Brianna Hughes MRN [id number]  Date of birth: 12/01/1985 Chief Complaint:   Routine Prenatal Visit (17P)  History of Present Illness:   Brianna Hughes is a 34 y.o. [phone] female at [redacted]w[redacted]d with an Estimated Date of Delivery: 09/18/19 being seen today for ongoing management of a high-risk pregnancy complicated by chronic hypertension currently on no meds.  Today she reports no complaints. Contractions: Not present. Vag. Bleeding: None.  Movement: Present. denies leaking of fluid.   Did not know about fasting, so had a Sprite to drink this morning. Will get PN2 with next visit. Expresses interest in BTL. Start back Lab 100bid, btl next time, pn2 next time, growth scan, ? About roxy vs subutex; NAS  Review of Systems:   Pertinent items are noted in HPI Denies abnormal vaginal discharge w/ itching/odor/irritation, headaches, visual changes, shortness of breath, chest pain, abdominal pain, severe nausea/vomiting, or problems with urination or bowel movements unless otherwise stated above. Pertinent History Reviewed:  Reviewed past medical,surgical, social, obstetrical and family history.  Reviewed problem list, medications and allergies. Physical Assessment:   Vitals:   06/13/19 0852 06/13/19 0901  BP: 140/85 (!) 139/97  Pulse: 96 (!) 102  Weight: 89 kg   Body mass index is 31.67 kg/m.           Physical Examination:   General appearance: alert, well appearing, and in no distress  Mental status: alert, oriented to person, place, and time  Skin: warm & dry   Extremities: Edema: None    Cardiovascular: normal heart rate noted  Respiratory: normal respiratory effort, no distress  Abdomen: gravid, soft, non-tender  Pelvic: Cervical exam deferred         Fetal Status: Fetal Heart Rate (bpm): 157 Fundal Height: 26 cm Movement: Present     Results for orders placed or performed in visit on 06/13/19 (from the past 24 hour(s))  POC Urinalysis Dipstick  OB   Collection Time: 06/13/19  8:54 AM  Result Value Ref Range   Color, UA     Clarity, UA     Glucose, UA Negative Negative   Bilirubin, UA     Ketones, UA neg    Spec Grav, UA     Blood, UA neg    pH, UA     POC,PROTEIN,UA Negative Negative, Trace, Small (1+), Moderate (2+), Large (3+), 4+   Urobilinogen, UA     Nitrite, UA neg    Leukocytes, UA Negative Negative   Appearance     Odor      Assessment & Plan:  1) High-risk pregnancy [id number] at [redacted]w[redacted]d with an Estimated Date of Delivery: 09/18/19   2) cHTN, was taking Lab 200mg  bid but became hypotensive and meds were stopped; today they are more elevated so will start back at half dose (100mg  bid)  3) Hx opiate addiction, on Subutex, has questions for Dr Elonda Husky recontinuing on Subutex vx changing to Roxicodone (heard the withdrawal is easier on the baby); given info re NICU/NAS tour via [place]; recently started on Vistaril qid prn anxiety  4) May want BTL, reviewed; can sign papers at next visit  5) Hx PTD, continue weekly Makena  Meds:  Meds ordered this encounter  Medications  . labetalol (NORMODYNE) 100 MG tablet    Sig: Take 1 tablet (100 mg total) by mouth 2 (two) times daily.    Dispense:  60 tablet    Refill:  3    Order Specific Question:  Supervising Provider    Answer:   Jonnie Kind [[id number]]    Labs/procedures today: PN2 at next visit (had Sprite this morning); growth scan   Reviewed: Preterm labor symptoms and general obstetric precautions including but not limited to vaginal bleeding, contractions, leaking of fluid and fetal movement were reviewed in detail with the patient.  All questions were answered. Has home bp cuff. Check bp weekly, let us know if >140/90.   Follow-up: Return in about 2 weeks (around 06/27/2019) for [place]: EFW, HROB, in person, PN2 with Dr Elonda Husky.  Orders Placed This Encounter  Procedures  . US OB Follow Up  . POC Urinalysis Dipstick OB   Myrtis Ser [facility] 06/13/2019 9:15 PM

## 2019-06-13 NOTE — Patient Instructions (Signed)
Brianna Hughes, I greatly value your feedback.  If you receive a survey following your visit with [place] today, we appreciate you taking the time to fill it out.  Thanks, [name], [name] It is now [facility] ([place], [place] [address]) Entrance located off of [address] parking   Go to [email or web address] to register for FREE online childbirth classes  [place] Pediatricians/Family Doctors:  [facility] [place] [phone]                 [place] [phone] (usually not accepting new patients unless you have family there already, you are always welcome to call and ask)       [facility] Department [phone]       [facility] Pediatricians/Family Doctors:   [facility]: [phone]  [facility]: [phone]  [facility]: [place] Doctors:   [facility]: [place] [facility]: [place]:  [place]: [phone]    Home Blood Pressure Monitoring for Patients   Your provider has recommended that you check your blood pressure (BP) at least once a week at home. If you do not have a blood pressure cuff at home, one will be provided for you. Contact your provider if you have not received your monitor within 1 week.   Helpful Tips for Accurate Home Blood Pressure Checks  . Don't smoke, exercise, or drink caffeine 30 minutes before checking your BP . Use the restroom before checking your BP (a full bladder can raise your pressure) . Relax in a comfortable upright chair . Feet on the ground . Left arm resting comfortably on a flat surface at the level of your heart . Legs uncrossed . Back supported . Sit quietly and don't talk . Place the cuff on your bare  arm . Adjust snuggly, so that only two fingertips can fit between your skin and the top of the cuff . Check 2 readings separated by at least one minute . Keep a log of your BP readings . For a visual, please reference this diagram: http://ccnc.care/bpdiagram  Provider Name: [facility]     Phone: [phone]  Zone 1: ALL CLEAR  Continue to monitor your symptoms:  . BP reading is less than 140 (top number) or less than 90 (bottom number)  . No right upper stomach pain . No headaches or seeing spots . No feeling nauseated or throwing up . No swelling in face and hands  Zone 2: CAUTION Call your doctor's office for any of the following:  . BP reading is greater than 140 (top number) or greater than 90 (bottom number)  . Stomach pain under your ribs in the middle or right side . Headaches or seeing spots . Feeling nauseated or throwing up . Swelling in face and hands  Zone 3: EMERGENCY  Seek immediate medical care if you have any of the following:  . BP reading is greater than160 (top number) or greater than 110 (bottom number) . Severe headaches not improving with Tylenol . Serious difficulty catching your breath . Any worsening symptoms from Zone 2     Second Trimester of Pregnancy The second trimester is from week 14 through week 27 (months 4 through 6). The second trimester is often  a time when you feel your best. Your body has adjusted to being pregnant, and you begin to feel better physically. Usually, morning sickness has lessened or quit completely, you may have more energy, and you may have an increase in appetite. The second trimester is also a time when the fetus is growing rapidly. At the end of the sixth month, the fetus is about 9 inches long and weighs about 1 pounds. You will likely begin to feel the baby move (quickening) between 16 and 20 weeks of pregnancy. Body changes during your second trimester Your body continues to go through many changes during your  second trimester. The changes vary from woman to woman.  Your weight will continue to increase. You will notice your lower abdomen bulging out.  You may begin to get stretch marks on your hips, abdomen, and breasts.  You may develop headaches that can be relieved by medicines. The medicines should be approved by your health care provider.  You may urinate more often because the fetus is pressing on your bladder.  You may develop or continue to have heartburn as a result of your pregnancy.  You may develop constipation because certain hormones are causing the muscles that push waste through your intestines to slow down.  You may develop hemorrhoids or swollen, bulging veins (varicose veins).  You may have back pain. This is caused by: ? Weight gain. ? Pregnancy hormones that are relaxing the joints in your pelvis. ? A shift in weight and the muscles that support your balance.  Your breasts will continue to grow and they will continue to become tender.  Your gums may bleed and may be sensitive to brushing and flossing.  Dark spots or blotches (chloasma, mask of pregnancy) may develop on your face. This will likely fade after the baby is born.  A dark line from your belly button to the pubic area (linea nigra) may appear. This will likely fade after the baby is born.  You may have changes in your hair. These can include thickening of your hair, rapid growth, and changes in texture. Some women also have hair loss during or after pregnancy, or hair that feels dry or thin. Your hair will most likely return to normal after your baby is born.  What to expect at prenatal visits During a routine prenatal visit:  You will be weighed to make sure you and the fetus are growing normally.  Your blood pressure will be taken.  Your abdomen will be measured to track your baby's growth.  The fetal heartbeat will be listened to.  Any test results from the previous visit will be  discussed.  Your health care provider may ask you:  How you are feeling.  If you are feeling the baby move.  If you have had any abnormal symptoms, such as leaking fluid, bleeding, severe headaches, or abdominal cramping.  If you are using any tobacco products, including cigarettes, chewing tobacco, and electronic cigarettes.  If you have any questions.  Other tests that may be performed during your second trimester include:  Blood tests that check for: ? Low iron levels (anemia). ? High blood sugar that affects pregnant women (gestational diabetes) between 16 and 28 weeks. ? Rh antibodies. This is to check for a protein on red blood cells (Rh factor).  Urine tests to check for infections, diabetes, or protein in the urine.  An ultrasound to confirm the proper growth and development of the baby.  An amniocentesis to check  for possible genetic problems.  Fetal screens for spina bifida and Down syndrome.  HIV (human immunodeficiency virus) testing. Routine prenatal testing includes screening for HIV, unless you choose not to have this test.  Follow these instructions at home: Medicines  Follow your health care provider's instructions regarding medicine use. Specific medicines may be either safe or unsafe to take during pregnancy.  Take a prenatal vitamin that contains at least 600 micrograms (mcg) of folic acid.  If you develop constipation, try taking a stool softener if your health care provider approves. Eating and drinking  Eat a balanced diet that includes fresh fruits and vegetables, whole grains, good sources of protein such as meat, eggs, or tofu, and low-fat dairy. Your health care provider will help you determine the amount of weight gain that is right for you.  Avoid raw meat and uncooked cheese. These carry germs that can cause birth defects in the baby.  If you have low calcium intake from food, talk to your health care provider about whether you should take a  daily calcium supplement.  Limit foods that are high in fat and processed sugars, such as fried and sweet foods.  To prevent constipation: ? Drink enough fluid to keep your urine clear or pale yellow. ? Eat foods that are high in fiber, such as fresh fruits and vegetables, whole grains, and beans. Activity  Exercise only as directed by your health care provider. Most women can continue their usual exercise routine during pregnancy. Try to exercise for 30 minutes at least 5 days a week. Stop exercising if you experience uterine contractions.  Avoid heavy lifting, wear low heel shoes, and practice good posture.  A sexual relationship may be continued unless your health care provider directs you otherwise. Relieving pain and discomfort  Wear a good support bra to prevent discomfort from breast tenderness.  Take warm sitz baths to soothe any pain or discomfort caused by hemorrhoids. Use hemorrhoid cream if your health care provider approves.  Rest with your legs elevated if you have leg cramps or low back pain.  If you develop varicose veins, wear support hose. Elevate your feet for 15 minutes, 3-4 times a day. Limit salt in your diet. Prenatal Care  Write down your questions. Take them to your prenatal visits.  Keep all your prenatal visits as told by your health care provider. This is important. Safety  Wear your seat belt at all times when driving.  Make a list of emergency phone numbers, including numbers for family, friends, the hospital, and police and fire departments. General instructions  Ask your health care provider for a referral to a local prenatal education class. Begin classes no later than the beginning of month 6 of your pregnancy.  Ask for help if you have counseling or nutritional needs during pregnancy. Your health care provider can offer advice or refer you to specialists for help with various needs.  Do not use hot tubs, steam rooms, or saunas.  Do not  douche or use tampons or scented sanitary pads.  Do not cross your legs for long periods of time.  Avoid cat litter boxes and soil used by cats. These carry germs that can cause birth defects in the baby and possibly loss of the fetus by miscarriage or stillbirth.  Avoid all smoking, herbs, alcohol, and unprescribed drugs. Chemicals in these products can affect the formation and growth of the baby.  Do not use any products that contain nicotine or tobacco, such as cigarettes  and e-cigarettes. If you need help quitting, ask your health care provider.  Visit your dentist if you have not gone yet during your pregnancy. Use a soft toothbrush to brush your teeth and be gentle when you floss. Contact a health care provider if:  You have dizziness.  You have mild pelvic cramps, pelvic pressure, or nagging pain in the abdominal area.  You have persistent nausea, vomiting, or diarrhea.  You have a bad smelling vaginal discharge.  You have pain when you urinate. Get help right away if:  You have a fever.  You are leaking fluid from your vagina.  You have spotting or bleeding from your vagina.  You have severe abdominal cramping or pain.  You have rapid weight gain or weight loss.  You have shortness of breath with chest pain.  You notice sudden or extreme swelling of your face, hands, ankles, feet, or legs.  You have not felt your baby move in over an hour.  You have severe headaches that do not go away when you take medicine.  You have vision changes. Summary  The second trimester is from week 14 through week 27 (months 4 through 6). It is also a time when the fetus is growing rapidly.  Your body goes through many changes during pregnancy. The changes vary from woman to woman.  Avoid all smoking, herbs, alcohol, and unprescribed drugs. These chemicals affect the formation and growth your baby.  Do not use any tobacco products, such as cigarettes, chewing tobacco, and  e-cigarettes. If you need help quitting, ask your health care provider.  Contact your health care provider if you have any questions. Keep all prenatal visits as told by your health care provider. This is important. This information is not intended to replace advice given to you by your health care provider. Make sure you discuss any questions you have with your health care provider. Document Released: 04/20/2001 Document Revised: 10/02/2015 Document Reviewed: 06/27/2012 Elsevier Interactive Patient Education  2017 [place] FLU! Because you are pregnant, we at [facility], along with the Centers for Disease Control (CDC), recommend that you receive the flu vaccine to protect yourself and your baby from the flu. The flu is more likely to cause severe illness in pregnant women than in women of reproductive age who are not pregnant. Changes in the immune system, heart, and lungs during pregnancy make pregnant women (and women up to two weeks postpartum) more prone to severe illness from flu, including illness resulting in hospitalization. Flu also may be harmful for a pregnant woman's developing baby. A common flu symptom is fever, which may be associated with neural tube defects and other adverse outcomes for a developing baby. Getting vaccinated can also help protect a baby after birth from flu. (Mom passes antibodies onto the developing baby during her pregnancy.)  A Flu Vaccine is the Best Protection Against Flu Getting a flu vaccine is the first and most important step in protecting against flu. Pregnant women should get a flu shot and not the live attenuated influenza vaccine (LAIV), also known as nasal spray flu vaccine. Flu vaccines given during pregnancy help protect both the mother and her baby from flu. Vaccination has been shown to reduce the risk of flu-associated acute respiratory infection in pregnant women by up to one-half. A 2018 study showed  that getting a flu shot reduced a pregnant woman's risk of being hospitalized with flu by an average of 40  percent. Pregnant women who get a flu vaccine are also helping to protect their babies from flu illness for the first several months after their birth, when they are too young to get vaccinated.   A Long Record of Safety for Flu Shots in Pregnant Women Flu shots have been given to millions of pregnant women over many years with a good safety record. There is a lot of evidence that flu vaccines can be given safely during pregnancy; though these data are limited for the first trimester. The CDC recommends that pregnant women get vaccinated during any trimester of their pregnancy. It is very important for pregnant women to get the flu shot.   Other Preventive Actions In addition to getting a flu shot, pregnant women should take the same everyday preventive actions the CDC recommends of everyone, including covering coughs, washing hands often, and avoiding people who are sick.  Symptoms and Treatment If you get sick with flu symptoms call your doctor right away. There are antiviral drugs that can treat flu illness and prevent serious flu complications. The CDC recommends prompt treatment for people who have influenza infection or suspected influenza infection and who are at high risk of serious flu complications, such as people with asthma, diabetes (including gestational diabetes), or heart disease. Early treatment of influenza in hospitalized pregnant women has been shown to reduce the length of the hospital stay.  Symptoms Flu symptoms include fever, cough, sore throat, runny or stuffy nose, body aches, headache, chills and fatigue. Some people may also have vomiting and diarrhea. People may be infected with the flu and have respiratory symptoms without a fever.  Early Treatment is Important for Pregnant Women Treatment should begin as soon as possible because antiviral drugs work best when  started early (within 48 hours after symptoms start). Antiviral drugs can make your flu illness milder and make you feel better faster. They may also prevent serious health problems that can result from flu illness. Oral oseltamivir (Tamiflu) is the preferred treatment for pregnant women because it has the most studies available to suggest that it is safe and beneficial. Antiviral drugs require a prescription from your provider. Having a fever caused by flu infection or other infections early in pregnancy may be linked to birth defects in a baby. In addition to taking antiviral drugs, pregnant women who get a fever should treat their fever with Tylenol (acetaminophen) and contact their provider immediately.  When to [place] If you are pregnant and have any of these signs, seek care immediately:  Difficulty breathing or shortness of breath  Pain or pressure in the chest or abdomen  Sudden dizziness  Confusion  Severe or persistent vomiting  High fever that is not responding to Tylenol (or store brand equivalent)  Decreased or no movement of your baby  [email or web address]

## 2019-06-14 ENCOUNTER — Encounter: Payer: Medicaid Other | Admitting: Obstetrics & Gynecology

## 2019-06-14 ENCOUNTER — Other Ambulatory Visit: Payer: Medicaid Other

## 2019-06-20 ENCOUNTER — Ambulatory Visit (INDEPENDENT_AMBULATORY_CARE_PROVIDER_SITE_OTHER): Payer: Medicaid Other

## 2019-06-20 ENCOUNTER — Other Ambulatory Visit: Payer: Self-pay

## 2019-06-20 VITALS — BP 108/72 | HR 85 | Ht 66.0 in | Wt 197.3 lb

## 2019-06-20 DIAGNOSIS — O09212 Supervision of pregnancy with history of pre-term labor, second trimester: Secondary | ICD-10-CM

## 2019-06-20 DIAGNOSIS — Z3A27 27 weeks gestation of pregnancy: Secondary | ICD-10-CM | POA: Diagnosis not present

## 2019-06-20 DIAGNOSIS — Z331 Pregnant state, incidental: Secondary | ICD-10-CM

## 2019-06-20 DIAGNOSIS — O09892 Supervision of other high risk pregnancies, second trimester: Secondary | ICD-10-CM

## 2019-06-20 DIAGNOSIS — Z302 Encounter for sterilization: Secondary | ICD-10-CM

## 2019-06-20 DIAGNOSIS — O0990 Supervision of high risk pregnancy, unspecified, unspecified trimester: Secondary | ICD-10-CM

## 2019-06-20 DIAGNOSIS — Z1389 Encounter for screening for other disorder: Secondary | ICD-10-CM

## 2019-06-20 LAB — POCT URINALYSIS DIPSTICK OB
Blood, UA: NEGATIVE
Glucose, UA: NEGATIVE
Ketones, UA: NEGATIVE
Leukocytes, UA: NEGATIVE
Nitrite, UA: NEGATIVE

## 2019-06-20 MED ORDER — HYDROXYPROGESTERONE CAPROATE 275 MG/1.1ML ~~LOC~~ SOAJ
275.0000 mg | Freq: Once | SUBCUTANEOUS | Status: AC
  Administered 2019-06-20: 10:00:00 275 mg via SUBCUTANEOUS

## 2019-06-20 NOTE — Progress Notes (Signed)
   NURSE VISIT- INJECTION  SUBJECTIVE:  Brianna Hughes is a 34 y.o. 2017 female here for a Makena for history of preterm birth. She is [redacted]w[redacted]d pregnant.   OBJECTIVE:  BP 108/72   Pulse 85   Ht 5\' 6"  (1.676 m)   Wt 197 lb 4.8 oz (89.5 kg)   LMP 12/22/2018 (Within Weeks)   BMI 31.85 kg/m   Appears well, in no apparent distress  Injection administered in: Left deltoid  Meds ordered this encounter  Medications  . HYDROXYprogesterone caproate (Makena) autoinjector 275 mg    ASSESSMENT: Pregnancy [redacted]w[redacted]d Makena for history of preterm birth PLAN: Follow-up: in 1 week for next [place]  06/20/2019 10:27 AM

## 2019-06-27 ENCOUNTER — Encounter: Payer: Self-pay | Admitting: Obstetrics and Gynecology

## 2019-06-27 ENCOUNTER — Other Ambulatory Visit: Payer: Medicaid Other

## 2019-06-27 ENCOUNTER — Other Ambulatory Visit: Payer: Self-pay

## 2019-06-27 ENCOUNTER — Ambulatory Visit (INDEPENDENT_AMBULATORY_CARE_PROVIDER_SITE_OTHER): Payer: Medicaid Other | Admitting: Obstetrics and Gynecology

## 2019-06-27 ENCOUNTER — Ambulatory Visit (INDEPENDENT_AMBULATORY_CARE_PROVIDER_SITE_OTHER): Payer: Medicaid Other

## 2019-06-27 VITALS — BP 116/78 | HR 82 | Wt 198.6 lb

## 2019-06-27 DIAGNOSIS — Z331 Pregnant state, incidental: Secondary | ICD-10-CM

## 2019-06-27 DIAGNOSIS — Z362 Encounter for other antenatal screening follow-up: Secondary | ICD-10-CM

## 2019-06-27 DIAGNOSIS — Z3A28 28 weeks gestation of pregnancy: Secondary | ICD-10-CM | POA: Diagnosis not present

## 2019-06-27 DIAGNOSIS — O10919 Unspecified pre-existing hypertension complicating pregnancy, unspecified trimester: Secondary | ICD-10-CM

## 2019-06-27 DIAGNOSIS — O0993 Supervision of high risk pregnancy, unspecified, third trimester: Secondary | ICD-10-CM

## 2019-06-27 DIAGNOSIS — O0990 Supervision of high risk pregnancy, unspecified, unspecified trimester: Secondary | ICD-10-CM

## 2019-06-27 DIAGNOSIS — Z1389 Encounter for screening for other disorder: Secondary | ICD-10-CM

## 2019-06-27 LAB — POCT URINALYSIS DIPSTICK OB
Blood, UA: NEGATIVE
Glucose, UA: NEGATIVE
Ketones, UA: NEGATIVE
Leukocytes, UA: NEGATIVE
Nitrite, UA: NEGATIVE
POC,PROTEIN,UA: NEGATIVE

## 2019-06-27 NOTE — Progress Notes (Signed)
[place] [id number] wks,cephalic,anterior placenta gr 0,normal ovaries,cx 2.9 cm,afi 12.7 cm,fhr 148 bpm,EFW 1186 g 38%

## 2019-06-27 NOTE — Progress Notes (Addendum)
Patient ID: Brianna Hughes, female   DOB: 02/28/1986, 34 y.o.   MRN: [id number]    [facility] PREGNANCY VISIT Patient name: [name] MRN [id number]  Date of birth: 1985/08/14 Chief Complaint:   High Risk Gestation (PN2, [place] today, 17P; sore in right side of ribs)  History of Present Illness:   Brianna Hughes is a 34 y.o. G50P1102 female at [redacted]w[redacted]d with an Estimated Date of Delivery: 09/18/19 being seen today for ongoing management of a high-risk pregnancy complicated by chronic hypertension currently on labetalol 100 mg BID, taken once daily if BP low in mornings. No dizziness..  Today she reports soreness on ribs right side. Pain is mainly after standing for extended periods of time. She denies any pain after eating. She denies any light headedness. She will only take 1 dose of medication if she has a good BP reading in the morning after first dosage.   Contractions: Not present. Vag. Bleeding: None.  Movement: Present. denies leaking of fluid.  Review of Systems:   Pertinent items are noted in HPI Denies abnormal vaginal discharge w/ itching/odor/irritation, headaches, visual changes, shortness of breath, abdominal pain, severe nausea/vomiting, or problems with urination or bowel movements unless otherwise stated above. Pertinent History Reviewed:  Reviewed past medical,surgical, social, obstetrical and family history.  Reviewed problem list, medications and allergies. Physical Assessment:   Vitals:   06/27/19 1042  BP: 116/78  Pulse: 82  Weight: 198 lb 9.6 oz (90.1 kg)  Body mass index is 32.05 kg/m.           Physical Examination:   General appearance: alert, well appearing, and in no distress  Mental status: normal mood, behavior, speech, dress, motor activity, and thought processes, affect appropriate to mood  Skin: warm & dry   Extremities: Edema: Trace    Cardiovascular: normal heart rate noted  Respiratory: normal respiratory effort, no distress  Abdomen: gravid, soft,  non-tender, neg murphys sign.  Pelvic: Cervical exam deferred         Fetal Status: Fetal Heart Rate (bpm): 148 u/s Fundal Height: 27 cm Movement: Present    Fetal Surveillance Testing today: [place] [id number] wks,cephalic,anterior placenta gr 0,normal ovaries,cx 2.9 cm,afi 12.7 cm,fhr 148 bpm,EFW 1186 g 38%   Chaperone: Samul Dada    Results for orders placed or performed in visit on 06/27/19 (from the past 24 hour(s))  POC Urinalysis Dipstick OB   Collection Time: 06/27/19 10:44 AM  Result Value Ref Range   Color, UA     Clarity, UA     Glucose, UA Negative Negative   Bilirubin, UA     Ketones, UA neg    Spec Grav, UA     Blood, UA neg    pH, UA     POC,PROTEIN,UA Negative Negative, Trace, Small (1+), Moderate (2+), Large (3+), 4+   Urobilinogen, UA     Nitrite, UA neg    Leukocytes, UA Negative Negative   Appearance     Odor      Assessment & Plan:  1) High-risk pregnancy G3P1102 at [redacted]w[redacted]d with an Estimated Date of Delivery: 09/18/19   2) CHTN, stable, normal pressures on low doses labetalol and EFW 38%ile for GA.  Meds: No orders of the defined types were placed in this encounter.  Labs/procedures today: u/s and PN2, 17P  Treatment Plan:  Growth u/s at 32 and 36 weeks  Reviewed: Preterm labor symptoms and general obstetric precautions including but not limited to vaginal bleeding, contractions, leaking of  fluid and fetal movement were reviewed in detail with the patient.  All questions were answered. Has home bp cuff.   Follow-up: Return in about 2 weeks (around 07/11/2019).  Orders Placed This Encounter  Procedures  . POC Urinalysis Dipstick OB   By signing my name below, I, Samul Dada, attest that this documentation has been prepared under the direction and in the presence of Jonnie Kind, MD. Electronically Signed: [place]. 06/27/19. 11:28 AM.  I personally performed the services described in this documentation, which was SCRIBED in my presence.  The recorded information has been reviewed and considered accurate. It has been edited as necessary during review. Jonnie Kind, MD

## 2019-06-28 LAB — CBC
Hematocrit: 37.5 % (ref 34.0–46.6)
Hemoglobin: 12.4 g/dL (ref 11.1–15.9)
MCH: 30.3 pg (ref 26.6–33.0)
MCHC: 33.1 g/dL (ref 31.5–35.7)
MCV: 92 fL (ref 79–97)
Platelets: 257 10*3/uL (ref 150–450)
RBC: 4.09 x10E6/uL (ref 3.77–5.28)
RDW: 13 % (ref 11.7–15.4)
WBC: 11.1 10*3/uL — ABNORMAL HIGH (ref 3.4–10.8)

## 2019-06-28 LAB — ANTIBODY SCREEN: Antibody Screen: NEGATIVE

## 2019-06-28 LAB — GLUCOSE TOLERANCE, 2 HOURS W/ 1HR
Glucose, 1 hour: 126 mg/dL (ref 65–179)
Glucose, 2 hour: 94 mg/dL (ref 65–152)
Glucose, Fasting: 84 mg/dL (ref 65–91)

## 2019-06-28 LAB — HIV ANTIBODY (ROUTINE TESTING W REFLEX): HIV Screen 4th Generation wRfx: NONREACTIVE

## 2019-06-28 LAB — RPR: RPR Ser Ql: NONREACTIVE

## 2019-07-04 ENCOUNTER — Other Ambulatory Visit: Payer: Self-pay

## 2019-07-04 ENCOUNTER — Ambulatory Visit: Payer: Medicaid Other

## 2019-07-04 ENCOUNTER — Ambulatory Visit (INDEPENDENT_AMBULATORY_CARE_PROVIDER_SITE_OTHER): Payer: Medicaid Other | Admitting: *Deleted

## 2019-07-04 DIAGNOSIS — O09213 Supervision of pregnancy with history of pre-term labor, third trimester: Secondary | ICD-10-CM | POA: Diagnosis not present

## 2019-07-04 DIAGNOSIS — Z8751 Personal history of pre-term labor: Secondary | ICD-10-CM

## 2019-07-04 DIAGNOSIS — Z3A29 29 weeks gestation of pregnancy: Secondary | ICD-10-CM

## 2019-07-04 NOTE — Progress Notes (Signed)
   NURSE VISIT- INJECTION  SUBJECTIVE:  Brianna Hughes is a 34 y.o. [phone] female here for a Makena for history of preterm birth. She is [redacted]w[redacted]d pregnant.   OBJECTIVE:  LMP 12/22/2018 (Within Weeks)   Appears well, in no apparent distress  Injection administered in: Left deltoid  No orders of the defined types were placed in this encounter.   ASSESSMENT: Pregnancy [redacted]w[redacted]d Makena for history of preterm birth PLAN: Follow-up: in 1 week for next Makena   Advait Buice, Jazzilyn [place]  07/04/2019 10:20 AM

## 2019-07-11 ENCOUNTER — Encounter: Payer: Medicaid Other | Admitting: Women's Health

## 2019-07-24 ENCOUNTER — Encounter: Payer: Self-pay | Admitting: Women's Health

## 2019-07-24 ENCOUNTER — Ambulatory Visit (INDEPENDENT_AMBULATORY_CARE_PROVIDER_SITE_OTHER): Payer: Medicaid Other | Admitting: Women's Health

## 2019-07-24 ENCOUNTER — Other Ambulatory Visit: Payer: Self-pay

## 2019-07-24 VITALS — BP 116/76 | HR 81 | Wt 200.4 lb

## 2019-07-24 DIAGNOSIS — O0990 Supervision of high risk pregnancy, unspecified, unspecified trimester: Secondary | ICD-10-CM

## 2019-07-24 DIAGNOSIS — O09213 Supervision of pregnancy with history of pre-term labor, third trimester: Secondary | ICD-10-CM

## 2019-07-24 DIAGNOSIS — O10919 Unspecified pre-existing hypertension complicating pregnancy, unspecified trimester: Secondary | ICD-10-CM

## 2019-07-24 DIAGNOSIS — O09899 Supervision of other high risk pregnancies, unspecified trimester: Secondary | ICD-10-CM

## 2019-07-24 DIAGNOSIS — F1120 Opioid dependence, uncomplicated: Secondary | ICD-10-CM

## 2019-07-24 DIAGNOSIS — O99320 Drug use complicating pregnancy, unspecified trimester: Secondary | ICD-10-CM

## 2019-07-24 DIAGNOSIS — Z331 Pregnant state, incidental: Secondary | ICD-10-CM

## 2019-07-24 DIAGNOSIS — Z3A32 32 weeks gestation of pregnancy: Secondary | ICD-10-CM | POA: Diagnosis not present

## 2019-07-24 DIAGNOSIS — O0993 Supervision of high risk pregnancy, unspecified, third trimester: Secondary | ICD-10-CM

## 2019-07-24 DIAGNOSIS — Z1389 Encounter for screening for other disorder: Secondary | ICD-10-CM

## 2019-07-24 DIAGNOSIS — Z302 Encounter for sterilization: Secondary | ICD-10-CM

## 2019-07-24 LAB — POCT URINALYSIS DIPSTICK OB
Blood, UA: NEGATIVE
Glucose, UA: NEGATIVE
Leukocytes, UA: NEGATIVE
Nitrite, UA: NEGATIVE

## 2019-07-24 NOTE — Progress Notes (Signed)
HIGH-RISK PREGNANCY VISIT Patient name: Brianna Hughes MRN [id number]  Date of birth: January 13, 1986 Chief Complaint:   Routine Prenatal Visit  History of Present Illness:   Brianna Hughes is a 34 y.o. [id number] female at [redacted]w[redacted]d with an Estimated Date of Delivery: 09/18/19 being seen today for ongoing management of a high-risk pregnancy complicated by chronic hypertension currently on Labetalol 100mg  BID, subutex maintenance 8mg  BID, h/o 35wk PTB on Makena.  Today she reports soreness, has been doing a lot lately.Wants BTL.  Contractions: Not present. Vag. Bleeding: None.  Movement: Present. denies leaking of fluid.  Review of Systems:   Pertinent items are noted in HPI Denies abnormal vaginal discharge w/ itching/odor/irritation, headaches, visual changes, shortness of breath, chest pain, abdominal pain, severe nausea/vomiting, or problems with urination or bowel movements unless otherwise stated above. Pertinent History Reviewed:  Reviewed past medical,surgical, social, obstetrical and family history.  Reviewed problem list, medications and allergies. Physical Assessment:   Vitals:   07/24/19 1346  BP: 116/76  Pulse: 81  Weight: 200 lb 6.4 oz (90.9 kg)  Body mass index is 32.35 kg/m.           Physical Examination:   General appearance: alert, well appearing, and in no distress  Mental status: alert, oriented to person, place, and time  Skin: warm & dry   Extremities: Edema: Trace    Cardiovascular: normal heart rate noted  Respiratory: normal respiratory effort, no distress  Abdomen: gravid, soft, non-tender  Pelvic: Cervical exam deferred         Fetal Status: Fetal Heart Rate (bpm): 140   Movement: Present    Fetal Surveillance Testing today: NST: FHR baseline 140 bpm, Variability: moderate, Accelerations:present, Decelerations:  Absent= Cat 1/Reactive Toco: none     Chaperone: n/a    Results for orders placed or performed in visit on 07/24/19 (from the past 24 hour(s))  POC  Urinalysis Dipstick OB   Collection Time: 07/24/19  1:50 PM  Result Value Ref Range   Color, UA     Clarity, UA     Glucose, UA Negative Negative   Bilirubin, UA     Ketones, UA trace    Spec Grav, UA     Blood, UA n    pH, UA     POC,PROTEIN,UA Small (1+) Negative, Trace, Small (1+), Moderate (2+), Large (3+), 4+   Urobilinogen, UA     Nitrite, UA n    Leukocytes, UA Negative Negative   Appearance     Odor      Assessment & Plan:  1) High-risk pregnancy G3P1102 at [redacted]w[redacted]d with an Estimated Date of Delivery: 09/18/19   2) CHTN, stable on Labetalol 100mg  BID, ASA, begin 2x/wk testing  3) Subutex maintenance, stable, 8mg  TID, has NAS/NICU consult info  4) H/O 35wk PTB> getting weekly Makena  5) Wants BTL> reviewed risks/benefits, LARCs just as effective, consent signed today   Meds: No orders of the defined types were placed in this encounter.  Labs/procedures today: Makena, NST, declined tdap today- next visit, declines flu shot  Treatment Plan:  Growth u/s q4wks    2x/wk testing nst/sono      Deliver 38-39wks (37wks or prn if poor control)____   Reviewed: Preterm labor symptoms and general obstetric precautions including but not limited to vaginal bleeding, contractions, leaking of fluid and fetal movement were reviewed in detail with the patient.  All questions were answered.   Follow-up: Return for thisfri nst w/rn, repeating:mon nst w/  rn, thurs bpp/dopp (efw too 3/25) & hrob w/ md/cnm until 4/20.  Orders Placed This Encounter  Procedures  . POC Urinalysis Dipstick OB   Roma Schanz CNM, [facility] 07/24/2019 5:00 PM

## 2019-07-24 NOTE — Patient Instructions (Signed)
Brianna Hughes, I greatly value your feedback.  If you receive a survey following your visit with [place] today, we appreciate you taking the time to fill it out.  Thanks, Knute Neu, CNM, [facility]  [place]!!! It is now [place] at [facility] ([place], [place] [address]) Entrance located off of [address] parking   Go to [email or web address] to register for FREE online childbirth classes    Call the office [phone]) or go to [facility] if:  You begin to have strong, frequent contractions  Your water breaks.  Sometimes it is a big gush of fluid, sometimes it is just a trickle that keeps getting your panties wet or running down your legs  You have vaginal bleeding.  It is normal to have a small amount of spotting if your cervix was checked.   You don't feel your baby moving like normal.  If you don't, get you something to eat and drink and lay down and focus on feeling your baby move.  You should feel at least 10 movements in 2 hours.  If you don't, you should call the office or go to [place] Blood Pressure Monitoring for Patients   Your provider has recommended that you check your blood pressure (BP) at least once a week at home. If you do not have a blood pressure cuff at home, one will be provided for you. Contact your provider if you have not received your monitor within 1 week.   Helpful Tips for Accurate Home Blood Pressure Checks  . Don't smoke, exercise, or drink caffeine 30 minutes before checking your BP . Use the restroom before checking your BP (a full bladder can raise your pressure) . Relax in a comfortable upright chair . Feet on the ground . Left arm resting comfortably on a flat surface at the level of your heart . Legs uncrossed . Back supported . Sit quietly and don't talk . Place the cuff on your bare arm . Adjust snuggly, so that only two fingertips can fit between your skin and  the top of the cuff . Check 2 readings separated by at least one minute . Keep a log of your BP readings . For a visual, please reference this diagram: http://ccnc.care/bpdiagram  Provider Name: [facility]     Phone: [phone]  Zone 1: ALL CLEAR  Continue to monitor your symptoms:  . BP reading is less than 140 (top number) or less than 90 (bottom number)  . No right upper stomach pain . No headaches or seeing spots . No feeling nauseated or throwing up . No swelling in face and hands  Zone 2: CAUTION Call your doctor's office for any of the following:  . BP reading is greater than 140 (top number) or greater than 90 (bottom number)  . Stomach pain under your ribs in the middle or right side . Headaches or seeing spots . Feeling nauseated or throwing up . Swelling in face and hands  Zone 3: EMERGENCY  Seek immediate medical care if you have any of the following:  . BP reading is greater than160 (top number) or greater than 110 (bottom number) . Severe headaches not improving with Tylenol . Serious difficulty catching your breath . Any worsening symptoms from Zone 2    Preterm Labor and Birth Information  The normal length of a pregnancy is 39-41 weeks. Preterm labor is when labor starts  before 37 completed weeks of pregnancy. What are the risk factors for preterm labor? Preterm labor is more likely to occur in women who:  Have certain infections during pregnancy such as a bladder infection, sexually transmitted infection, or infection inside the uterus (chorioamnionitis).  Have a shorter-than-normal cervix.  Have gone into preterm labor before.  Have had surgery on their cervix.  Are younger than age 102 or older than age 73.  Are African American.  Are pregnant with twins or multiple babies (multiple gestation).  Take street drugs or smoke while pregnant.  Do not gain enough weight while pregnant.  Became pregnant shortly after having been  pregnant. What are the symptoms of preterm labor? Symptoms of preterm labor include:  Cramps similar to those that can happen during a menstrual period. The cramps may happen with diarrhea.  Pain in the abdomen or lower back.  Regular uterine contractions that may feel like tightening of the abdomen.  A feeling of increased pressure in the pelvis.  Increased watery or bloody mucus discharge from the vagina.  Water breaking (ruptured amniotic sac). Why is it important to recognize signs of preterm labor? It is important to recognize signs of preterm labor because babies who are born prematurely may not be fully developed. This can put them at an increased risk for:  Long-term (chronic) heart and lung problems.  Difficulty immediately after birth with regulating body systems, including blood sugar, body temperature, heart rate, and breathing rate.  Bleeding in the brain.  Cerebral palsy.  Learning difficulties.  Death. These risks are highest for babies who are born before 61 weeks of pregnancy. How is preterm labor treated? Treatment depends on the length of your pregnancy, your condition, and the health of your baby. It may involve:  Having a stitch (suture) placed in your cervix to prevent your cervix from opening too early (cerclage).  Taking or being given medicines, such as: ? Hormone medicines. These may be given early in pregnancy to help support the pregnancy. ? Medicine to stop contractions. ? Medicines to help mature the baby's lungs. These may be prescribed if the risk of delivery is high. ? Medicines to prevent your baby from developing cerebral palsy. If the labor happens before 34 weeks of pregnancy, you may need to stay in the hospital. What should I do if I think I am in preterm labor? If you think that you are going into preterm labor, call your health care provider right away. How can I prevent preterm labor in future pregnancies? To increase your chance  of having a full-term pregnancy:  Do not use any tobacco products, such as cigarettes, chewing tobacco, and e-cigarettes. If you need help quitting, ask your health care provider.  Do not use street drugs or medicines that have not been prescribed to you during your pregnancy.  Talk with your health care provider before taking any herbal supplements, even if you have been taking them regularly.  Make sure you gain a healthy amount of weight during your pregnancy.  Watch for infection. If you think that you might have an infection, get it checked right away.  Make sure to tell your health care provider if you have gone into preterm labor before. This information is not intended to replace advice given to you by your health care provider. Make sure you discuss any questions you have with your health care provider. Document Revised: 08/18/2018 Document Reviewed: 09/17/2015 Elsevier Patient Education  [place].

## 2019-07-26 ENCOUNTER — Telehealth: Payer: Self-pay | Admitting: *Deleted

## 2019-07-26 NOTE — Telephone Encounter (Signed)
Makena injections reordered.

## 2019-07-27 ENCOUNTER — Encounter: Payer: Medicaid Other | Admitting: Women's Health

## 2019-07-27 ENCOUNTER — Telehealth: Payer: Self-pay | Admitting: *Deleted

## 2019-07-27 ENCOUNTER — Other Ambulatory Visit: Payer: Self-pay

## 2019-07-27 VITALS — BP 127/82 | HR 82 | Wt 200.0 lb

## 2019-07-27 DIAGNOSIS — O288 Other abnormal findings on antenatal screening of mother: Secondary | ICD-10-CM | POA: Insufficient documentation

## 2019-07-27 LAB — POCT URINALYSIS DIPSTICK OB
Blood, UA: NEGATIVE
Glucose, UA: NEGATIVE
Leukocytes, UA: NEGATIVE
Nitrite, UA: NEGATIVE

## 2019-07-27 NOTE — Telephone Encounter (Signed)
Patient came in for appt today for NST and states she fell in the [place] parking lot on all fours.  Advised patient she will need to go to the hospital for 4 hours of monitoring.  Pt verbalized understanding and will go.

## 2019-07-28 ENCOUNTER — Encounter (HOSPITAL_COMMUNITY): Payer: Self-pay | Admitting: Obstetrics & Gynecology

## 2019-07-28 ENCOUNTER — Inpatient Hospital Stay (HOSPITAL_COMMUNITY)
Admission: AD | Admit: 2019-07-28 | Discharge: 2019-07-28 | Disposition: A | Discharge status: Home / Self Care | Payer: Medicaid Other | Attending: Obstetrics & Gynecology | Admitting: Obstetrics & Gynecology

## 2019-07-28 ENCOUNTER — Other Ambulatory Visit: Payer: Self-pay

## 2019-07-28 DIAGNOSIS — R109 Unspecified abdominal pain: Secondary | ICD-10-CM

## 2019-07-28 DIAGNOSIS — F17210 Nicotine dependence, cigarettes, uncomplicated: Secondary | ICD-10-CM | POA: Insufficient documentation

## 2019-07-28 DIAGNOSIS — Z3A32 32 weeks gestation of pregnancy: Secondary | ICD-10-CM | POA: Insufficient documentation

## 2019-07-28 DIAGNOSIS — M79602 Pain in left arm: Secondary | ICD-10-CM

## 2019-07-28 DIAGNOSIS — W010XXA Fall on same level from slipping, tripping and stumbling without subsequent striking against object, initial encounter: Secondary | ICD-10-CM | POA: Diagnosis not present

## 2019-07-28 DIAGNOSIS — Z3689 Encounter for other specified antenatal screening: Secondary | ICD-10-CM

## 2019-07-28 DIAGNOSIS — O368130 Decreased fetal movements, third trimester, not applicable or unspecified: Secondary | ICD-10-CM | POA: Diagnosis present

## 2019-07-28 DIAGNOSIS — O99891 Other specified diseases and conditions complicating pregnancy: Secondary | ICD-10-CM | POA: Diagnosis not present

## 2019-07-28 DIAGNOSIS — Z885 Allergy status to narcotic agent status: Secondary | ICD-10-CM | POA: Diagnosis not present

## 2019-07-28 DIAGNOSIS — M79601 Pain in right arm: Secondary | ICD-10-CM

## 2019-07-28 DIAGNOSIS — O99333 Smoking (tobacco) complicating pregnancy, third trimester: Secondary | ICD-10-CM | POA: Diagnosis not present

## 2019-07-28 DIAGNOSIS — O0990 Supervision of high risk pregnancy, unspecified, unspecified trimester: Secondary | ICD-10-CM

## 2019-07-28 DIAGNOSIS — Z888 Allergy status to other drugs, medicaments and biological substances status: Secondary | ICD-10-CM | POA: Diagnosis not present

## 2019-07-28 DIAGNOSIS — O9A213 Injury, poisoning and certain other consequences of external causes complicating pregnancy, third trimester: Secondary | ICD-10-CM

## 2019-07-28 DIAGNOSIS — Z302 Encounter for sterilization: Secondary | ICD-10-CM

## 2019-07-28 MED ORDER — CYCLOBENZAPRINE HCL 5 MG PO TABS
10.0000 mg | ORAL_TABLET | Freq: Once | ORAL | Status: AC
  Administered 2019-07-28: 10 mg via ORAL
  Filled 2019-07-28: qty 2

## 2019-07-28 NOTE — MAU Provider Note (Signed)
History     CSN: [id number]  Arrival date and time: 07/28/19 1328   First Provider Initiated Contact with Patient 07/28/19 1359      Chief Complaint  Patient presents with  . Decreased Fetal Movement   HPI  Ms.  Brianna Hughes is a 34 y.o. year old G47P1102 female at [redacted]w[redacted]d weeks gestation who presents to MAU reporting she tripped on a curb @ 1130 yesterday 07/27/2019; which caused her to fall forward. She reports landing on her knees and breaking her fall with her hands. She denies hitting her head or abdomen. She was instructed by her OB office to come to MAU yesterday, but she "didn't come because the baby is moving well, no bleeding, leaking water or pain." She states that when she woke up this morning she noticed DFM and some soreness of her flanks and arms and that "scared" her. She decided to come to MAU for evaluation. Since arriving to MAU she reports "feeling him move all over the place."  Past Medical History:  Diagnosis Date  . Anemia   . Anxiety   . Chronic ankle pain   . Chronic back pain   . Chronic headaches   . Chronic knee pain   . Depression    bipolar  . Fibromyalgia   . Headache(784.0)   . Hypertension   . Rheumatoid arthritis ([place])   . Scoliosis     Past Surgical History:  Procedure Laterality Date  . MOUTH SURGERY      Family History  Problem Relation Age of Onset  . Hypertension Mother   . Hypertension Father   . Diabetes Brother   . Hypertension Brother   . Cancer Paternal Grandfather        prostate  . Congestive Heart Failure Maternal Grandfather   . Multiple sclerosis Maternal Aunt   . Anesthesia problems Neg Hx   . Hypotension Neg Hx   . Malignant hyperthermia Neg Hx   . Pseudochol deficiency Neg Hx     Social History   Tobacco Use  . Smoking status: Current Every Day Smoker    Packs/day: 0.50    Years: 15.00    Pack years: 7.50    Types: Cigarettes    Last attempt to quit: 03/10/2016    Years since quitting: 3.3  . Smokeless  tobacco: Never Used  Substance Use Topics  . Alcohol use: Not Currently    Comment: very rarely  . Drug use: No    Types: Benzodiazepines    Comment: hx of pain pills-  pt denies    Allergies:  Allergies  Allergen Reactions  . Hydrocodone Nausea And Vomiting  . Tramadol Other (See Comments)    Seizure     Facility-Administered Medications Prior to Admission  Medication Dose Route Frequency Provider Last Rate Last Admin  . HYDROXYprogesterone caproate (Makena) autoinjector 275 mg  275 mg Subcutaneous Weekly Florian Buff, MD   275 mg at 07/24/19 1348   Medications Prior to Admission  Medication Sig Dispense Refill Last Dose  . buprenorphine (SUBUTEX) 8 MG SUBL SL tablet Every 8 hrs.     . hydrOXYzine (ATARAX/VISTARIL) 25 MG tablet Take 25 mg by mouth in the morning, at noon, in the evening, and at bedtime.     Marland Kitchen labetalol (NORMODYNE) 100 MG tablet Take 1 tablet (100 mg total) by mouth 2 (two) times daily. 60 tablet 3   . MELATONIN PO Take by mouth at bedtime.     . promethazine (  PHENERGAN) 25 MG tablet Take 0.5-1 tablets (12.5-25 mg total) by mouth every 6 (six) hours as needed for nausea or vomiting. 30 tablet 4   . sertraline (ZOLOFT) 50 MG tablet Take 50 mg by mouth every morning.       Review of Systems  Constitutional: Negative.   HENT: Negative.   Eyes: Negative.   Respiratory: Negative.   Cardiovascular: Negative.   Gastrointestinal: Negative.   Endocrine: Negative.   Genitourinary:       DFM since this morning, "but he's moving all over the place now."  Musculoskeletal: Positive for myalgias (muscle soreness of flanks and arms).  Skin: Negative.   Allergic/Immunologic: Negative.   Neurological: Negative.   Hematological: Negative.   Psychiatric/Behavioral: Negative.    Physical Exam   Blood pressure 128/73, pulse 86, temperature 99 F (37.2 C), temperature source Oral, resp. rate 20, last menstrual period 12/22/2018.  Physical Exam  Nursing note and  vitals reviewed. Constitutional: She is oriented to person, place, and time. She appears well-developed and well-nourished.  HENT:  Head: Normocephalic and atraumatic.  Eyes: Pupils are equal, round, and reactive to light.  Cardiovascular: Normal rate.  Respiratory: Effort normal.  GI: Soft.  Genitourinary:    Genitourinary Comments: deferred   Musculoskeletal:        General: Normal range of motion.     Cervical back: Normal range of motion.  Neurological: She is alert and oriented to person, place, and time.  Skin: Skin is warm and dry.  Psychiatric: She has a normal mood and affect. Her behavior is normal. Judgment and thought content normal.   NST - FHR: 145 bpm / moderate variability / accels present / decels absent / TOCO: UI noted  MAU Course  Procedures  MDM Flexeril 10 mg -- pain resolving "feeling a little better"  Assessment and Plan  NST (non-stress test) reactive  - Reassurance given that NST was normal and baby's well-being is good - Information provided on feta monitoring overview   Traumatic injury during pregnancy in third trimester  - Information provided on fetal kick count   - Discharge patient - Keep scheduled appts - Patient verbalized an understanding of the plan of care and agrees.    Laury Deep, MSN, CNM 07/28/2019, 2:00 PM

## 2019-07-28 NOTE — MAU Note (Signed)
Pt is a G3P2 at 32.4 week c/o decreased fetal movement after a fall forward over the curb yesterday around 1130.  No bleeding, or LOF.  Pt has CHTN well controlled on medication with weekly NST.

## 2019-07-30 ENCOUNTER — Other Ambulatory Visit: Payer: Medicaid Other

## 2019-07-30 NOTE — Progress Notes (Signed)
Pt advised to go to MAU for 4hrs EFM d/t fall. Pt not seen here in office.  This encounter was created in error - please disregard.

## 2019-08-01 ENCOUNTER — Other Ambulatory Visit: Payer: Self-pay | Admitting: Women's Health

## 2019-08-01 DIAGNOSIS — O10919 Unspecified pre-existing hypertension complicating pregnancy, unspecified trimester: Secondary | ICD-10-CM

## 2019-08-02 ENCOUNTER — Encounter: Payer: Self-pay | Admitting: Obstetrics & Gynecology

## 2019-08-02 ENCOUNTER — Ambulatory Visit (INDEPENDENT_AMBULATORY_CARE_PROVIDER_SITE_OTHER): Payer: Medicaid Other

## 2019-08-02 ENCOUNTER — Ambulatory Visit (INDEPENDENT_AMBULATORY_CARE_PROVIDER_SITE_OTHER): Payer: Medicaid Other | Admitting: Obstetrics & Gynecology

## 2019-08-02 ENCOUNTER — Other Ambulatory Visit: Payer: Self-pay

## 2019-08-02 VITALS — BP 115/82 | HR 82 | Wt 203.0 lb

## 2019-08-02 DIAGNOSIS — O09213 Supervision of pregnancy with history of pre-term labor, third trimester: Secondary | ICD-10-CM | POA: Diagnosis not present

## 2019-08-02 DIAGNOSIS — Z302 Encounter for sterilization: Secondary | ICD-10-CM

## 2019-08-02 DIAGNOSIS — Z331 Pregnant state, incidental: Secondary | ICD-10-CM

## 2019-08-02 DIAGNOSIS — Z3A33 33 weeks gestation of pregnancy: Secondary | ICD-10-CM

## 2019-08-02 DIAGNOSIS — Z23 Encounter for immunization: Secondary | ICD-10-CM

## 2019-08-02 DIAGNOSIS — O10913 Unspecified pre-existing hypertension complicating pregnancy, third trimester: Secondary | ICD-10-CM

## 2019-08-02 DIAGNOSIS — O10919 Unspecified pre-existing hypertension complicating pregnancy, unspecified trimester: Secondary | ICD-10-CM

## 2019-08-02 DIAGNOSIS — O0990 Supervision of high risk pregnancy, unspecified, unspecified trimester: Secondary | ICD-10-CM

## 2019-08-02 DIAGNOSIS — O10013 Pre-existing essential hypertension complicating pregnancy, third trimester: Secondary | ICD-10-CM

## 2019-08-02 DIAGNOSIS — Z1389 Encounter for screening for other disorder: Secondary | ICD-10-CM

## 2019-08-02 LAB — POCT URINALYSIS DIPSTICK OB
Blood, UA: NEGATIVE
Glucose, UA: NEGATIVE
Ketones, UA: NEGATIVE
Leukocytes, UA: NEGATIVE
Nitrite, UA: NEGATIVE
POC,PROTEIN,UA: NEGATIVE

## 2019-08-02 NOTE — Progress Notes (Signed)
   HIGH-RISK PREGNANCY VISIT Patient name: Brianna Hughes MRN [id number]  Date of birth: 1985-11-21 Chief Complaint:   High Risk Gestation ([place] today)  History of Present Illness:   Brianna Hughes is a 34 y.o. [phone] female at [redacted]w[redacted]d with an Estimated Date of Delivery: 09/18/19 being seen today for ongoing management of a high-risk pregnancy complicated by chronic hypertension currently on labetalol 100 daily.  Today she reports no complaints. Contractions: Not present. Vag. Bleeding: None.  Movement: Present. denies leaking of fluid.  Review of Systems:   Pertinent items are noted in HPI Denies abnormal vaginal discharge w/ itching/odor/irritation, headaches, visual changes, shortness of breath, chest pain, abdominal pain, severe nausea/vomiting, or problems with urination or bowel movements unless otherwise stated above. Pertinent History Reviewed:  Reviewed past medical,surgical, social, obstetrical and family history.  Reviewed problem list, medications and allergies. Physical Assessment:   Vitals:   08/02/19 1134  BP: 115/82  Pulse: 82  Weight: 203 lb (92.1 kg)  Body mass index is 32.77 kg/m.           Physical Examination:   General appearance: alert, well appearing, and in no distress  Mental status: alert, oriented to person, place, and time  Skin: warm & dry   Extremities: Edema: Trace    Cardiovascular: normal heart rate noted  Respiratory: normal respiratory effort, no distress  Abdomen: gravid, soft, non-tender  Pelvic: Cervical exam deferred         Fetal Status:     Movement: Present    Fetal Surveillance Testing today: BPP 8/8   Chaperone: n/a    Results for orders placed or performed in visit on 08/02/19 (from the past 24 hour(s))  POC Urinalysis Dipstick OB   Collection Time: 08/02/19 11:29 AM  Result Value Ref Range   Color, UA     Clarity, UA     Glucose, UA Negative Negative   Bilirubin, UA     Ketones, UA neg    Spec Grav, UA     Blood, UA neg    pH,  UA     POC,PROTEIN,UA Negative Negative, Trace, Small (1+), Moderate (2+), Large (3+), 4+   Urobilinogen, UA     Nitrite, UA neg    Leukocytes, UA Negative Negative   Appearance     Odor      Assessment & Plan:  1) High-risk pregnancy G3P1102 at [redacted]w[redacted]d with an Estimated Date of Delivery: 09/18/19   2) CHTN, stable, on labetalol 100 daily  3) Subutex maintainence, stable  Meds: No orders of the defined types were placed in this encounter.   Labs/procedures today: BPP 8/8 with normal Dopplers 51%  Treatment Plan:  Twice weekly surveillance, sonogram alternating with NST, induction at 39 weeks or as clinically indicated   Reviewed: Preterm labor symptoms and general obstetric precautions including but not limited to vaginal bleeding, contractions, leaking of fluid and fetal movement were reviewed in detail with the patient.  All questions were answered. Has home bp cuff. Rx faxed to . Check bp weekly, let us know if >140/90.   Follow-up: Return in about 5 days (around 08/07/2019) for NST, HROB.  Orders Placed This Encounter  Procedures  . Tdap vaccine greater than or equal to 7yo IM  . POC Urinalysis Dipstick OB   Florian Buff  08/02/2019 12:29 PM

## 2019-08-02 NOTE — Progress Notes (Signed)
[place] [id number] wks,cephalic,BPP [id number] placenta gr 2,fhr 154 bpm,afi 11 cm,RI .65,.58,.60,.61=51%,EFW 2110 g 35%

## 2019-08-06 ENCOUNTER — Other Ambulatory Visit: Payer: Medicaid Other

## 2019-08-07 ENCOUNTER — Other Ambulatory Visit: Payer: Medicaid Other

## 2019-08-07 ENCOUNTER — Other Ambulatory Visit: Payer: Self-pay | Admitting: Obstetrics & Gynecology

## 2019-08-07 DIAGNOSIS — O10919 Unspecified pre-existing hypertension complicating pregnancy, unspecified trimester: Secondary | ICD-10-CM

## 2019-08-09 ENCOUNTER — Other Ambulatory Visit: Payer: Self-pay

## 2019-08-09 ENCOUNTER — Ambulatory Visit (INDEPENDENT_AMBULATORY_CARE_PROVIDER_SITE_OTHER): Payer: Medicaid Other | Admitting: Obstetrics and Gynecology

## 2019-08-09 ENCOUNTER — Ambulatory Visit (INDEPENDENT_AMBULATORY_CARE_PROVIDER_SITE_OTHER): Payer: Medicaid Other

## 2019-08-09 VITALS — BP 129/88 | HR 78 | Wt 201.0 lb

## 2019-08-09 DIAGNOSIS — O10919 Unspecified pre-existing hypertension complicating pregnancy, unspecified trimester: Secondary | ICD-10-CM

## 2019-08-09 DIAGNOSIS — O99323 Drug use complicating pregnancy, third trimester: Secondary | ICD-10-CM

## 2019-08-09 DIAGNOSIS — Z302 Encounter for sterilization: Secondary | ICD-10-CM

## 2019-08-09 DIAGNOSIS — O10013 Pre-existing essential hypertension complicating pregnancy, third trimester: Secondary | ICD-10-CM

## 2019-08-09 DIAGNOSIS — O10913 Unspecified pre-existing hypertension complicating pregnancy, third trimester: Secondary | ICD-10-CM

## 2019-08-09 DIAGNOSIS — O0990 Supervision of high risk pregnancy, unspecified, unspecified trimester: Secondary | ICD-10-CM

## 2019-08-09 DIAGNOSIS — O09893 Supervision of other high risk pregnancies, third trimester: Secondary | ICD-10-CM | POA: Diagnosis not present

## 2019-08-09 DIAGNOSIS — Z3A34 34 weeks gestation of pregnancy: Secondary | ICD-10-CM

## 2019-08-09 DIAGNOSIS — F1190 Opioid use, unspecified, uncomplicated: Secondary | ICD-10-CM

## 2019-08-09 NOTE — Progress Notes (Signed)
[place] 0000000 wks,cephalic,fhr 123456 bpm,BPP [id number] placenta gr 2,RI .61,.67,.66=82%,S/D 2.98= 76%,AFI 14.7 cm

## 2019-08-09 NOTE — Progress Notes (Signed)
Patient ID: Brianna Hughes, female   DOB: Dec 05, 1985, 34 y.o.   MRN: [id number]  .   HIGH-RISK PREGNANCY VISIT Patient name: Brianna Hughes MRN [id number]  Date of birth: [birth year] Chief Complaint:   Routine Prenatal Visit ([place])  History of Present Illness:   Brianna Hughes is a 34 y.o. [phone] female at [redacted]w[redacted]d with an Estimated Date of Delivery: 09/18/19 being seen today for ongoing management of a high-risk pregnancy complicated by chronic hypertension currently on labetalol 100 daily.  Today she reports backache and fatigue.   Depression screen [facility] 2/9 03/07/2019 09/08/2018 02/08/2018 12/08/2016  Decreased Interest 0 0 2 0  Down, Depressed, Hopeless 0 1 1 1   PHQ - 2 Score 0 1 3 1   Altered sleeping 3 - 3 -  Tired, decreased energy 2 - 3 -  Change in appetite 1 - 3 -  Feeling bad or failure about yourself  0 - 0 -  Trouble concentrating 3 - 3 -  Moving slowly or fidgety/restless 0 - 0 -  Suicidal thoughts 0 - 0 -  PHQ-9 Score 9 - 15 -    Contractions: Not present. Vag. Bleeding: None.  Movement: Present. denies leaking of fluid.  Review of Systems:   Pertinent items are noted in HPI Denies abnormal vaginal discharge w/ itching/odor/irritation, headaches, visual changes, shortness of breath, chest pain, abdominal pain, severe nausea/vomiting, or problems with urination or bowel movements unless otherwise stated above. Pertinent History Reviewed:  Reviewed past medical,surgical, social, obstetrical and family history.  Reviewed problem list, medications and allergies. Physical Assessment:   Vitals:   08/09/19 1108  BP: 129/88  Pulse: 78  Weight: 201 lb (91.2 kg)  Body mass index is 32.44 kg/m.           Physical Examination:   General appearance: alert, well appearing, and in no distress, oriented to person, place, and time and normal appearing weight  Mental status: alert, oriented to person, place, and time  Skin: warm & dry   Extremities: Edema: Trace    Cardiovascular: normal  heart rate noted  Respiratory: normal respiratory effort, no distress  Abdomen: gravid, soft, non-tender   33 cm  Pelvic: Cervical exam deferred         Fetal Status:     Movement: Present    Fetal Surveillance Testing today: u/s BPP   Chaperone: [employer]    No results found for this or any previous visit (from the past 24 hour(s)).  Assessment & Plan:  1) High-risk pregnancy G3P1102 at [redacted]w[redacted]d with an Estimated Date of Delivery: 09/18/19   2) Birth control options discussed: Reviewed all forms of birth control options available including abstinence; over the counter/barrier methods; depo, nuva ring, Nexplanon, IUD, OCP and patch.   Risks/benefits/side effects of each discussed. Permanent sterilization options including vasectomy and the various tubal sterilization modalities. Questions were answered.  Pt chooses tubal ligation.  Papers signed previously.   3) CHTN , stable  Meds: No orders of the defined types were placed in this encounter.   Labs/procedures today: FOLLOW UP SONOGRAM   Brianna Hughes is in the office for a follow up sonogram for BPP and cord dopplers.  She is a 34 y.o. year old G87P1102 with Estimated Date of Delivery: 09/18/19 by early ultrasound now at  [redacted]w[redacted]d weeks gestation. Thus far the pregnancy has been complicated by CHTN,hx of prior preterm delivery,subutex maintenace.  GESTATION: SINGLETON  PRESENTATION: cephalic  FETAL ACTIVITY:  Heart rate         148          The fetus is active.  AMNIOTIC FLUID: The amniotic fluid volume is  normal, 14.7 cm.  PLACENTA LOCALIZATION:  posterior GRADE 2  CERVIX: Limited view  ADNEXA: wnl   GESTATIONAL AGE AND  BIOMETRICS:  Gestational criteria: Estimated Date of Delivery: 09/18/19 by early ultrasound now at [redacted]w[redacted]d  Previous Scans:5   BIOPHYSICAL PROFILE:                                                                                                      [place]                 2   TONE                2   RESPIRATIONS                2   AMNIOTIC FLUID                2                                                            SCORE:  8/8 (Note: NST was not performed as part of this antepartum testing)  DOPPLER FLOW STUDIES: UMBILICAL ARTERY RI RATIOS:    .61,.67,.66=82%,S/D 2.98= 76%   ANATOMICAL SURVEY                                                                            COMMENTS CEREBRAL VENTRICLES Yes     normal   CHOROID PLEXUS yes normal   CEREBELLUM yes normal                            FACIAL PROFILE yes normal   4 CHAMBERED HEART yes normal   OUTFLOW TRACTS yes normal   DIAPHRAGM yes normal   STOMACH yes normal   RENAL REGION yes normal   BLADDER yes normal        3 VESSEL CORD yes normal                  GENITALIA   female        SUSPECTED ABNORMALITIES:  no  QUALITY OF SCAN: satisfactory  TECHNICIAN COMMENTS:   [place] 0000000 wks,cephalic,fhr 123456 bpm,BPP XX123456 placenta gr 2,RI .61,.67,.66=82%,S/D 2.98= 76%,AFI 14.7 cm       A copy of this report including all images has been saved and backed up to a  second source for retrieval if needed. All measures and details of the anatomical scan, placentation, fluid volume and pelvic anatomy are contained in that report.  [name] 08/09/2019 11:17 AM    Treatment Plan:  Twice weekly testing til delivery at 39 wk.  Reviewed:  labor symptoms and general obstetric precautions including but not limited to vaginal bleeding, contractions, leaking of fluid and fetal movement were reviewed in detail with the patient.  All questions were answered. has home bp cuff. . Check bp weekly, let us know if >140/90.   Follow-up: No follow-ups on file.  No orders of the defined types were placed in this encounter.   By signing my name below, I, [employer], attest that this  documentation has been prepared under the direction and in the presence of Jonnie Kind, MD. Electronically Signed: [place]. 08/09/19. 11:15 AM.  I personally performed the services described in this documentation, which was SCRIBED in my presence. The recorded information has been reviewed and considered accurate. It has been edited as necessary during review. Jonnie Kind, MD

## 2019-08-14 ENCOUNTER — Other Ambulatory Visit: Payer: Medicaid Other

## 2019-08-16 ENCOUNTER — Other Ambulatory Visit: Payer: Self-pay | Admitting: Obstetrics and Gynecology

## 2019-08-16 DIAGNOSIS — O10919 Unspecified pre-existing hypertension complicating pregnancy, unspecified trimester: Secondary | ICD-10-CM

## 2019-08-17 ENCOUNTER — Ambulatory Visit (INDEPENDENT_AMBULATORY_CARE_PROVIDER_SITE_OTHER): Payer: Medicaid Other

## 2019-08-17 ENCOUNTER — Ambulatory Visit (INDEPENDENT_AMBULATORY_CARE_PROVIDER_SITE_OTHER): Payer: Medicaid Other | Admitting: Obstetrics and Gynecology

## 2019-08-17 ENCOUNTER — Other Ambulatory Visit: Payer: Self-pay

## 2019-08-17 VITALS — BP 128/84 | HR 89 | Wt 201.2 lb

## 2019-08-17 DIAGNOSIS — Z331 Pregnant state, incidental: Secondary | ICD-10-CM

## 2019-08-17 DIAGNOSIS — Z3A35 35 weeks gestation of pregnancy: Secondary | ICD-10-CM | POA: Diagnosis not present

## 2019-08-17 DIAGNOSIS — O0990 Supervision of high risk pregnancy, unspecified, unspecified trimester: Secondary | ICD-10-CM

## 2019-08-17 DIAGNOSIS — O10913 Unspecified pre-existing hypertension complicating pregnancy, third trimester: Secondary | ICD-10-CM | POA: Diagnosis not present

## 2019-08-17 DIAGNOSIS — O09213 Supervision of pregnancy with history of pre-term labor, third trimester: Secondary | ICD-10-CM | POA: Diagnosis not present

## 2019-08-17 DIAGNOSIS — O09893 Supervision of other high risk pregnancies, third trimester: Secondary | ICD-10-CM | POA: Diagnosis not present

## 2019-08-17 DIAGNOSIS — Z302 Encounter for sterilization: Secondary | ICD-10-CM

## 2019-08-17 DIAGNOSIS — Z1389 Encounter for screening for other disorder: Secondary | ICD-10-CM

## 2019-08-17 DIAGNOSIS — O10919 Unspecified pre-existing hypertension complicating pregnancy, unspecified trimester: Secondary | ICD-10-CM

## 2019-08-17 LAB — POCT URINALYSIS DIPSTICK OB
Blood, UA: NEGATIVE
Glucose, UA: NEGATIVE
Ketones, UA: NEGATIVE
Leukocytes, UA: NEGATIVE
Nitrite, UA: NEGATIVE
POC,PROTEIN,UA: NEGATIVE

## 2019-08-17 NOTE — Progress Notes (Signed)
[place] [id number] wks,cephalic,BPP [id number] placenta gr 2,afi 11.9 cm,fgr 121 bpm,RI .60,.63,.57=62%,S/D 2.5=54%

## 2019-08-17 NOTE — Progress Notes (Signed)
Patient ID: [name], female   DOB: 05-14-1985, 34 y.o.   MRN: [id number]    [facility] PREGNANCY VISIT Patient name: Brianna Hughes MRN [id number]  Date of birth: Jul 20, 1985 Chief Complaint:   Routine Prenatal Visit (Ultrasound)  History of Present Illness:   Brianna Hughes is a 34 y.o. [id number] female at [redacted]w[redacted]d with an Estimated Date of Delivery: 09/18/19 being seen today for ongoing management of a high-risk pregnancy complicated by chronic hypertension currently on Labetalol 100 mg daily.  Today she reports sinus pressure and rhinorrhea secondary to seasonal allergies. Has been using Claritin without significant relief. Will try zyrtec, let us know next week if improved. Depression screen [facility] 2/9 03/07/2019 09/08/2018 02/08/2018 12/08/2016  Decreased Interest 0 0 2 0  Down, Depressed, Hopeless 0 1 1 1   PHQ - 2 Score 0 1 3 1   Altered sleeping 3 - 3 -  Tired, decreased energy 2 - 3 -  Change in appetite 1 - 3 -  Feeling bad or failure about yourself  0 - 0 -  Trouble concentrating 3 - 3 -  Moving slowly or fidgety/restless 0 - 0 -  Suicidal thoughts 0 - 0 -  PHQ-9 Score 9 - 15 -    Contractions: Irritability. Vag. Bleeding: None.  Movement: Present. denies leaking of fluid.  Review of Systems:   Pertinent items are noted in HPI Denies abnormal vaginal discharge w/ itching/odor/irritation, headaches, visual changes, shortness of breath, chest pain, abdominal pain, severe nausea/vomiting, or problems with urination or bowel movements unless otherwise stated above. Pertinent History Reviewed:  Reviewed past medical,surgical, social, obstetrical and family history.  Reviewed problem list, medications and allergies. Physical Assessment:   Vitals:   08/17/19 0911  BP: 128/84  Pulse: 89  Weight: 201 lb 3.2 oz (91.3 kg)  Body mass index is 32.47 kg/m.           Physical Examination:   General appearance: alert, well appearing, and in no distress  Mental status: alert, oriented to person,  place, and time  Skin: warm & dry   Extremities: Edema: Trace    Cardiovascular: normal heart rate noted  Respiratory: normal respiratory effort, no distress  Abdomen: gravid, soft, non-tender  Pelvic: Cervical exam deferred         Fetal Status:   Fundal Height: 36 cm Movement: Present    Fetal Surveillance Testing today: US Fetal BPP without Non Stress. Results show BPP of 8/8. Doppler studies are excellent.  TECHNICIAN COMMENTS:  [place] [id number] wks,cephalic,BPP [id number] placenta gr 2,afi 11.9 cm,fgr 121 bpm,RI .60,.63,.57=62%,S/D 2.5=54%  A copy of this report including all images has been saved and backed up to a second source for retrieval if needed. All measures and details of the anatomical scan, placentation, fluid volume and pelvic anatomy are contained in that report.  Silver Huguenin 08/17/2019 9:13 AM   Chaperone: Clerance Lav    Results for orders placed or performed in visit on 08/17/19 (from the past 24 hour(s))  POC Urinalysis Dipstick OB   Collection Time: 08/17/19  9:08 AM  Result Value Ref Range   Color, UA     Clarity, UA     Glucose, UA Negative Negative   Bilirubin, UA     Ketones, UA n    Spec Grav, UA     Blood, UA n    pH, UA     POC,PROTEIN,UA Negative Negative, Trace, Small (1+), Moderate (2+), Large (3+), 4+   Urobilinogen, UA  Nitrite, UA n    Leukocytes, UA Negative Negative   Appearance     Odor      Assessment & Plan:  1) High-risk pregnancy G3P1102 at [redacted]w[redacted]d, CHTN on low doses Labetalol 100 mg bid with an Estimated Date of Delivery: 09/18/19 , BPP 8/8 excellent dopplers.  2) Sinus irritation secondary to seasonal allergies, Recommended using Zyrtec over weekend.  Meds: No orders of the defined types were placed in this encounter.   Labs/procedures today: None  Treatment Plan: Return in four days for NST. Biweekly testing til iol 39 wk or as clinically indicated  Reviewed: Preterm labor symptoms and general obstetric precautions  including but not limited to vaginal bleeding, contractions, leaking of fluid and fetal movement were reviewed in detail with the patient.  All questions were answered. Has home bp cuff. Check bp weekly, let us know if >140/90.   Follow-up: Return in about 4 days (around 08/21/2019) for NST.  Orders Placed This Encounter  Procedures  . POC Urinalysis Dipstick OB   By signing my name below, I, Clerance Lav, attest that this documentation has been prepared under the direction and in the presence of Jonnie Kind, MD. Electronically Signed: [place]. 08/17/19. 9:36 AM.  I personally performed the services described in this documentation, which was SCRIBED in my presence. The recorded information has been reviewed and considered accurate. It has been edited as necessary during review. Jonnie Kind, MD

## 2019-08-21 ENCOUNTER — Other Ambulatory Visit: Payer: Medicaid Other

## 2019-08-21 ENCOUNTER — Other Ambulatory Visit: Payer: Self-pay | Admitting: Obstetrics and Gynecology

## 2019-08-21 DIAGNOSIS — O10919 Unspecified pre-existing hypertension complicating pregnancy, unspecified trimester: Secondary | ICD-10-CM

## 2019-08-24 ENCOUNTER — Other Ambulatory Visit (HOSPITAL_COMMUNITY)
Admission: RE | Admit: 2019-08-24 | Discharge: 2019-08-24 | Disposition: A | Discharge status: Home / Self Care | Payer: Medicaid Other | Source: Ambulatory Visit | Attending: Obstetrics & Gynecology | Admitting: Obstetrics & Gynecology

## 2019-08-24 ENCOUNTER — Other Ambulatory Visit: Payer: Self-pay

## 2019-08-24 ENCOUNTER — Ambulatory Visit (INDEPENDENT_AMBULATORY_CARE_PROVIDER_SITE_OTHER): Payer: Medicaid Other | Admitting: Women's Health

## 2019-08-24 ENCOUNTER — Encounter: Payer: Self-pay | Admitting: Women's Health

## 2019-08-24 ENCOUNTER — Ambulatory Visit (INDEPENDENT_AMBULATORY_CARE_PROVIDER_SITE_OTHER): Payer: Medicaid Other

## 2019-08-24 VITALS — BP 130/78 | HR 70 | Wt 201.0 lb

## 2019-08-24 DIAGNOSIS — Z3A36 36 weeks gestation of pregnancy: Secondary | ICD-10-CM

## 2019-08-24 DIAGNOSIS — O0993 Supervision of high risk pregnancy, unspecified, third trimester: Secondary | ICD-10-CM

## 2019-08-24 DIAGNOSIS — O10013 Pre-existing essential hypertension complicating pregnancy, third trimester: Secondary | ICD-10-CM | POA: Diagnosis not present

## 2019-08-24 DIAGNOSIS — O10919 Unspecified pre-existing hypertension complicating pregnancy, unspecified trimester: Secondary | ICD-10-CM

## 2019-08-24 DIAGNOSIS — O0990 Supervision of high risk pregnancy, unspecified, unspecified trimester: Secondary | ICD-10-CM | POA: Insufficient documentation

## 2019-08-24 DIAGNOSIS — Z331 Pregnant state, incidental: Secondary | ICD-10-CM

## 2019-08-24 DIAGNOSIS — Z1389 Encounter for screening for other disorder: Secondary | ICD-10-CM

## 2019-08-24 LAB — POCT URINALYSIS DIPSTICK OB
Glucose, UA: NEGATIVE
Ketones, UA: NEGATIVE
Leukocytes, UA: NEGATIVE
Nitrite, UA: NEGATIVE
POC,PROTEIN,UA: NEGATIVE

## 2019-08-24 MED ORDER — AZITHROMYCIN 250 MG PO TABS: ORAL_TABLET | ORAL | 0 refills | Status: DC

## 2019-08-24 NOTE — Progress Notes (Signed)
HIGH-RISK PREGNANCY VISIT Patient name: Brianna Hughes MRN [id number]  Date of birth: [birth year] Chief Complaint:   High Risk Gestation ([place] today; GBS, GC/CHL; back pain; sinus symptoms)  History of Present Illness:   Brianna Hughes is a 34 y.o. G63P1102 female at [redacted]w[redacted]d with an Estimated Date of Delivery: 09/18/19 being seen today for ongoing management of a high-risk pregnancy complicated by chronic hypertension currently on Labetalol 100mg  BID, subutex 8mg  TID, h/o 35wk PTB on Makena.  Today she reports Rt lower back/flank pain started last night, no UTI sx, no fever/chills. Sinus pressure/pain, foul smell to nasal mucous x 3wks.   Contractions: Irregular. Vag. Bleeding: None.  Movement: Present. denies leaking of fluid.  Review of Systems:   Pertinent items are noted in HPI Denies abnormal vaginal discharge w/ itching/odor/irritation, headaches, visual changes, shortness of breath, chest pain, abdominal pain, severe nausea/vomiting, or problems with urination or bowel movements unless otherwise stated above. Pertinent History Reviewed:  Reviewed past medical,surgical, social, obstetrical and family history.  Reviewed problem list, medications and allergies. Physical Assessment:   Vitals:   08/24/19 0957  BP: 130/78  Pulse: 70  Weight: 201 lb (91.2 kg)  Body mass index is 32.44 kg/m.           Physical Examination:   General appearance: alert, well appearing, and in no distress  Mental status: alert, oriented to person, place, and time  Skin: warm & dry   Face: + tenderness frontal/maxillary sinuses, Lt>RT  Extremities: Edema: Trace    Cardiovascular: normal heart rate noted  Respiratory: normal respiratory effort, no distress  Abdomen: gravid, soft, non-tender  Pelvic: Cervical exam performed  Dilation: Closed Effacement (%): Thick Station: -3  Fetal Status: Fetal Heart Rate (bpm): 137 u/s   Movement: Present Presentation: Vertex  Fetal Surveillance Testing today: [place] 123456  wks,cephalic,BPP [id number] 0000000 BPM,anterior placenta gr 3,afi 9.5 cm,RI .50,.61,.62=71%,EFW 2794 g 39%  Chaperone: Diona Fanti    Results for orders placed or performed in visit on 08/24/19 (from the past 24 hour(s))  POC Urinalysis Dipstick OB   Collection Time: 08/24/19  9:51 AM  Result Value Ref Range   Color, UA     Clarity, UA     Glucose, UA Negative Negative   Bilirubin, UA     Ketones, UA neg    Spec Grav, UA     Blood, UA trace    pH, UA     POC,PROTEIN,UA Negative Negative, Trace, Small (1+), Moderate (2+), Large (3+), 4+   Urobilinogen, UA     Nitrite, UA neg    Leukocytes, UA Negative Negative   Appearance     Odor      Assessment & Plan:  1) High-risk pregnancy [id number] at [redacted]w[redacted]d with an Estimated Date of Delivery: 09/18/19   2) CHTN, stable on Labetalol 100mg  BID, ASA  3) Subutex maintenance, stable 8mg  TID  4) H/O 35wk PTB> getting weekly Makena  Meds:  Meds ordered this encounter  Medications  . azithromycin (ZITHROMAX Z-PAK) 250 MG tablet    Sig: Use as directed    Dispense:  6 each    Refill:  0    Order Specific Question:   Supervising Provider    Answer:   Tania Ade H [[id number]]    Labs/procedures today: u/s, gbs, gc/ct, sve  Treatment Plan: 2x/wk testing nst/sono @ 32wks or weekly BPP    Deliver 38-39wks (37wks or prn if poor control)____   Reviewed: Preterm labor symptoms and general  obstetric precautions including but not limited to vaginal bleeding, contractions, leaking of fluid and fetal movement were reviewed in detail with the patient.  All questions were answered.   Follow-up: Return for Tue nst/rn, then Fri bpp/dopp & HROB w/ MD/CNM until 4/30 .  Orders Placed This Encounter  Procedures  . Strep Gp B NAA  . [place] UA Cord Doppler  . US FETAL BPP WO NON STRESS  . POC Urinalysis Dipstick OB   Roma Schanz CNM, [facility] 08/24/2019 10:17 AM

## 2019-08-24 NOTE — Patient Instructions (Addendum)
Brianna Hughes, I greatly value your feedback.  If you receive a survey following your visit with [place] today, we appreciate you taking the time to fill it out.  Thanks, Knute Neu, CNM, WHNP-BC  [facility] [place] [facility] ([place], [place] [address]) Entrance C, located off of [address] parking   Go to [email or web address] to register for FREE online childbirth classes   NICU tour/consult with neonatal nurse-practitioner [phone]    Call the office [phone]) or go to [facility] if:  You begin to have strong, frequent contractions  Your water breaks.  Sometimes it is a big gush of fluid, sometimes it is just a trickle that keeps getting your panties wet or running down your legs  You have vaginal bleeding.  It is normal to have a small amount of spotting if your cervix was checked.   You don't feel your baby moving like normal.  If you don't, get you something to eat and drink and lay down and focus on feeling your baby move.  You should feel at least 10 movements in 2 hours.  If you don't, you should call the office or go to [facility]   Call the office [phone]) or go to [facility] hospital for these signs of pre-eclampsia:  Severe headache that does not go away with Tylenol  Visual changes- seeing spots, double, blurred vision  Pain under your right breast or upper abdomen that does not go away with Tums or heartburn medicine  Nausea and/or vomiting  Severe swelling in your hands, feet, and face    Home Blood Pressure Monitoring for Patients   Your provider has recommended that you check your blood pressure (BP) at least once a week at home. If you do not have a blood pressure cuff at home, one will be provided for you. Contact your provider if you have not received your monitor within 1 week.   Helpful Tips for Accurate Home Blood Pressure Checks  . Don't smoke, exercise, or drink caffeine 30 minutes before checking  your BP . Use the restroom before checking your BP (a full bladder can raise your pressure) . Relax in a comfortable upright chair . Feet on the ground . Left arm resting comfortably on a flat surface at the level of your heart . Legs uncrossed . Back supported . Sit quietly and don't talk . Place the cuff on your bare arm . Adjust snuggly, so that only two fingertips can fit between your skin and the top of the cuff . Check 2 readings separated by at least one minute . Keep a log of your BP readings . For a visual, please reference this diagram: http://ccnc.care/bpdiagram  Provider Name: [facility]     Phone: [phone]  Zone 1: ALL CLEAR  Continue to monitor your symptoms:  . BP reading is less than 140 (top number) or less than 90 (bottom number)  . No right upper stomach pain . No headaches or seeing spots . No feeling nauseated or throwing up . No swelling in face and hands  Zone 2: CAUTION Call your doctor's office for any of the following:  . BP reading is greater than 140 (top number) or greater than 90 (bottom number)  . Stomach pain under your ribs in the middle or right side . Headaches or seeing spots . Feeling nauseated or throwing up . Swelling in face and hands  Zone 3: EMERGENCY  Seek  immediate medical care if you have any of the following:  . BP reading is greater than160 (top number) or greater than 110 (bottom number) . Severe headaches not improving with Tylenol . Serious difficulty catching your breath . Any worsening symptoms from Zone 2  Preterm Labor and Birth Information  The normal length of a pregnancy is 39-41 weeks. Preterm labor is when labor starts before 37 completed weeks of pregnancy. What are the risk factors for preterm labor? Preterm labor is more likely to occur in women who:  Have certain infections during pregnancy such as a bladder infection, sexually transmitted infection, or infection inside the uterus  (chorioamnionitis).  Have a shorter-than-normal cervix.  Have gone into preterm labor before.  Have had surgery on their cervix.  Are younger than age 61 or older than age 67.  Are African American.  Are pregnant with twins or multiple babies (multiple gestation).  Take street drugs or smoke while pregnant.  Do not gain enough weight while pregnant.  Became pregnant shortly after having been pregnant. What are the symptoms of preterm labor? Symptoms of preterm labor include:  Cramps similar to those that can happen during a menstrual period. The cramps may happen with diarrhea.  Pain in the abdomen or lower back.  Regular uterine contractions that may feel like tightening of the abdomen.  A feeling of increased pressure in the pelvis.  Increased watery or bloody mucus discharge from the vagina.  Water breaking (ruptured amniotic sac). Why is it important to recognize signs of preterm labor? It is important to recognize signs of preterm labor because babies who are born prematurely may not be fully developed. This can put them at an increased risk for:  Long-term (chronic) heart and lung problems.  Difficulty immediately after birth with regulating body systems, including blood sugar, body temperature, heart rate, and breathing rate.  Bleeding in the brain.  Cerebral palsy.  Learning difficulties.  Death. These risks are highest for babies who are born before 42 weeks of pregnancy. How is preterm labor treated? Treatment depends on the length of your pregnancy, your condition, and the health of your baby. It may involve: 1. Having a stitch (suture) placed in your cervix to prevent your cervix from opening too early (cerclage). 2. Taking or being given medicines, such as: ? Hormone medicines. These may be given early in pregnancy to help support the pregnancy. ? Medicine to stop contractions. ? Medicines to help mature the baby's lungs. These may be prescribed if  the risk of delivery is high. ? Medicines to prevent your baby from developing cerebral palsy. If the labor happens before 34 weeks of pregnancy, you may need to stay in the hospital. What should I do if I think I am in preterm labor? If you think that you are going into preterm labor, call your health care provider right away. How can I prevent preterm labor in future pregnancies? To increase your chance of having a full-term pregnancy:  Do not use any tobacco products, such as cigarettes, chewing tobacco, and e-cigarettes. If you need help quitting, ask your health care provider.  Do not use street drugs or medicines that have not been prescribed to you during your pregnancy.  Talk with your health care provider before taking any herbal supplements, even if you have been taking them regularly.  Make sure you gain a healthy amount of weight during your pregnancy.  Watch for infection. If you think that you might have an infection, get  it checked right away.  Make sure to tell your health care provider if you have gone into preterm labor before. This information is not intended to replace advice given to you by your health care provider. Make sure you discuss any questions you have with your health care provider. Document Revised: 08/18/2018 Document Reviewed: 09/17/2015 Elsevier Patient Education  [place].

## 2019-08-24 NOTE — Progress Notes (Signed)
[place] 123456 wks,cephalic,BPP [id number] 0000000 BPM,anterior placenta gr 3,afi 9.5 cm,RI .50,.61,.62=71%,EFW 2794 g 39%

## 2019-08-26 LAB — STREP GP B NAA: Strep Gp B NAA: NEGATIVE

## 2019-08-28 ENCOUNTER — Other Ambulatory Visit: Payer: Medicaid Other

## 2019-08-28 LAB — CERVICOVAGINAL ANCILLARY ONLY
Chlamydia: NEGATIVE
Comment: NEGATIVE
Comment: NORMAL
Neisseria Gonorrhea: NEGATIVE

## 2019-08-31 ENCOUNTER — Other Ambulatory Visit: Payer: Self-pay

## 2019-08-31 ENCOUNTER — Ambulatory Visit (INDEPENDENT_AMBULATORY_CARE_PROVIDER_SITE_OTHER): Payer: Medicaid Other | Admitting: Obstetrics & Gynecology

## 2019-08-31 ENCOUNTER — Ambulatory Visit (INDEPENDENT_AMBULATORY_CARE_PROVIDER_SITE_OTHER): Payer: Medicaid Other

## 2019-08-31 VITALS — BP 115/80 | HR 90 | Wt 202.0 lb

## 2019-08-31 DIAGNOSIS — O10919 Unspecified pre-existing hypertension complicating pregnancy, unspecified trimester: Secondary | ICD-10-CM

## 2019-08-31 DIAGNOSIS — O0990 Supervision of high risk pregnancy, unspecified, unspecified trimester: Secondary | ICD-10-CM

## 2019-08-31 DIAGNOSIS — Z3A37 37 weeks gestation of pregnancy: Secondary | ICD-10-CM

## 2019-08-31 DIAGNOSIS — O99323 Drug use complicating pregnancy, third trimester: Secondary | ICD-10-CM | POA: Diagnosis not present

## 2019-08-31 DIAGNOSIS — Z1389 Encounter for screening for other disorder: Secondary | ICD-10-CM

## 2019-08-31 DIAGNOSIS — F1120 Opioid dependence, uncomplicated: Secondary | ICD-10-CM

## 2019-08-31 DIAGNOSIS — F1190 Opioid use, unspecified, uncomplicated: Secondary | ICD-10-CM | POA: Diagnosis not present

## 2019-08-31 DIAGNOSIS — O10913 Unspecified pre-existing hypertension complicating pregnancy, third trimester: Secondary | ICD-10-CM

## 2019-08-31 DIAGNOSIS — Z331 Pregnant state, incidental: Secondary | ICD-10-CM

## 2019-08-31 DIAGNOSIS — O0993 Supervision of high risk pregnancy, unspecified, third trimester: Secondary | ICD-10-CM

## 2019-08-31 LAB — POCT URINALYSIS DIPSTICK OB
Blood, UA: NEGATIVE
Glucose, UA: NEGATIVE
Ketones, UA: NEGATIVE
Leukocytes, UA: NEGATIVE
Nitrite, UA: NEGATIVE
POC,PROTEIN,UA: NEGATIVE

## 2019-08-31 NOTE — Progress Notes (Signed)
HIGH-RISK PREGNANCY VISIT Patient name: Brianna Hughes MRN [id number]  Date of birth: 03/01/1986 Chief Complaint:   Routine Prenatal Visit (Ultrasound)  History of Present Illness:   Brianna Hughes is a 34 y.o. [phone] female at [redacted]w[redacted]d with an Estimated Date of Delivery: 09/18/19 being seen today for ongoing management of a high-risk pregnancy complicated by chronic hypertension currently on labetalol 100 mg BID.  Today she reports no complaints.  Depression screen [facility] 2/9 03/07/2019 09/08/2018 02/08/2018 12/08/2016  Decreased Interest 0 0 2 0  Down, Depressed, Hopeless 0 1 1 1   PHQ - 2 Score 0 1 3 1   Altered sleeping 3 - 3 -  Tired, decreased energy 2 - 3 -  Change in appetite 1 - 3 -  Feeling bad or failure about yourself  0 - 0 -  Trouble concentrating 3 - 3 -  Moving slowly or fidgety/restless 0 - 0 -  Suicidal thoughts 0 - 0 -  PHQ-9 Score 9 - 15 -    Contractions: Irregular. Vag. Bleeding: None.  Movement: Present. denies leaking of fluid.  Review of Systems:   Pertinent items are noted in HPI Denies abnormal vaginal discharge w/ itching/odor/irritation, headaches, visual changes, shortness of breath, chest pain, abdominal pain, severe nausea/vomiting, or problems with urination or bowel movements unless otherwise stated above. Pertinent History Reviewed:  Reviewed past medical,surgical, social, obstetrical and family history.  Reviewed problem list, medications and allergies. Physical Assessment:   Vitals:   08/31/19 1219  BP: 115/80  Pulse: 90  Weight: 202 lb (91.6 kg)  Body mass index is 32.6 kg/m.           Physical Examination:   General appearance: alert, well appearing, and in no distress  Mental status: alert, oriented to person, place, and time  Skin: warm & dry   Extremities: Edema: Trace    Cardiovascular: normal heart rate noted  Respiratory: normal respiratory effort, no distress  Abdomen: gravid, soft, non-tender  Pelvic: Cervical exam deferred          Fetal Status:     Movement: Present    Fetal Surveillance Testing today: BPP 8/8 normal Dopplers   Chaperone: n/a    Results for orders placed or performed in visit on 08/31/19 (from the past 24 hour(s))  POC Urinalysis Dipstick OB   Collection Time: 08/31/19 12:17 PM  Result Value Ref Range   Color, UA     Clarity, UA     Glucose, UA Negative Negative   Bilirubin, UA     Ketones, UA n    Spec Grav, UA     Blood, UA n    pH, UA     POC,PROTEIN,UA Negative Negative, Trace, Small (1+), Moderate (2+), Large (3+), 4+   Urobilinogen, UA     Nitrite, UA n    Leukocytes, UA Negative Negative   Appearance     Odor      Assessment & Plan:  1) High-risk pregnancy G3P1102 at [redacted]w[redacted]d with an Estimated Date of Delivery: 09/18/19   2) CHTN, stable, well controlled on labetalol 100 BID, BPP 8/8  3) Subutex maintainence, stable  Meds: No orders of the defined types were placed in this encounter.   Labs/procedures today: BPP 8/8 with normal Dopplers  Treatment Plan:  Twice weekly surveillance, IOL 39 weeks  Reviewed: Term labor symptoms and general obstetric precautions including but not limited to vaginal bleeding, contractions, leaking of fluid and fetal movement were reviewed in detail with the  patient.  All questions were answered. Has home bp cuff. Rx faxed to . Check bp weekly, let us know if >140/90.   Follow-up: No follow-ups on file.  Orders Placed This Encounter  Procedures  . POC Urinalysis Dipstick OB   Mertie Clause Aleksis Jiggetts 08/31/2019 1:17 PM

## 2019-08-31 NOTE — Progress Notes (Signed)
[place] [id number] wks,cephalic,BPP [id number] placenta gr 3,fhr 137 bpm,RI .58..61,.66=74%,AFI 13.5 cm

## 2019-09-01 ENCOUNTER — Other Ambulatory Visit: Payer: Self-pay

## 2019-09-01 ENCOUNTER — Encounter (HOSPITAL_COMMUNITY): Payer: Self-pay

## 2019-09-01 ENCOUNTER — Emergency Department (HOSPITAL_COMMUNITY): Payer: Medicaid Other

## 2019-09-01 ENCOUNTER — Emergency Department (HOSPITAL_COMMUNITY)
Admission: EM | Admit: 2019-09-01 | Discharge: 2019-09-01 | Disposition: A | Discharge status: Home / Self Care | Payer: Medicaid Other | Attending: Emergency Medicine | Admitting: Emergency Medicine

## 2019-09-01 DIAGNOSIS — M79642 Pain in left hand: Secondary | ICD-10-CM | POA: Insufficient documentation

## 2019-09-01 DIAGNOSIS — Z041 Encounter for examination and observation following transport accident: Secondary | ICD-10-CM

## 2019-09-01 DIAGNOSIS — O99891 Other specified diseases and conditions complicating pregnancy: Secondary | ICD-10-CM | POA: Diagnosis not present

## 2019-09-01 DIAGNOSIS — M25532 Pain in left wrist: Secondary | ICD-10-CM | POA: Insufficient documentation

## 2019-09-01 DIAGNOSIS — Z3A37 37 weeks gestation of pregnancy: Secondary | ICD-10-CM | POA: Diagnosis not present

## 2019-09-01 DIAGNOSIS — F17210 Nicotine dependence, cigarettes, uncomplicated: Secondary | ICD-10-CM | POA: Insufficient documentation

## 2019-09-01 DIAGNOSIS — Z79899 Other long term (current) drug therapy: Secondary | ICD-10-CM | POA: Diagnosis not present

## 2019-09-01 DIAGNOSIS — O10013 Pre-existing essential hypertension complicating pregnancy, third trimester: Secondary | ICD-10-CM | POA: Insufficient documentation

## 2019-09-01 DIAGNOSIS — O99333 Smoking (tobacco) complicating pregnancy, third trimester: Secondary | ICD-10-CM | POA: Insufficient documentation
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
## 2019-09-01 NOTE — Progress Notes (Signed)
2022 - OB RR RN monitoring FHR strip remotely.  Called primary RN at 2022 to request placing pt back on monitor.  Spoke with Dr. Glo Herring, he would like her monitored for four hours total and is en route to see patient right now.
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
## 2019-09-01 NOTE — ED Notes (Signed)
Notified Mary, Rapid Response RN of pt.  She will monitor.

## 2019-09-01 NOTE — ED Notes (Signed)
Pt placed on monitor.  
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
## 2019-09-01 NOTE — Progress Notes (Signed)
Spoke with [facility] RN and asked them to adjust the toco because it is not tracing.

## 2019-09-01 NOTE — Progress Notes (Signed)
Dr. Newman Pies given report on pt. ON unit and has reviewed the tracing. [facility] RN's asked to adjust the pt's toco because it is not tracing.
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
## 2019-09-01 NOTE — ED Notes (Signed)
Spoke with OB rapid response nurse, checked on pt, pt was getting getting xray of left arm. Pt had 2 lead vests on. Pt denies feeling of contractions.

## 2019-09-03 ENCOUNTER — Inpatient Hospital Stay (HOSPITAL_COMMUNITY): Payer: Medicaid Other | Admitting: Certified Registered Nurse Anesthetist

## 2019-09-03 ENCOUNTER — Other Ambulatory Visit: Payer: Self-pay

## 2019-09-03 ENCOUNTER — Encounter (HOSPITAL_COMMUNITY): Payer: Self-pay | Admitting: Obstetrics & Gynecology

## 2019-09-03 ENCOUNTER — Inpatient Hospital Stay (HOSPITAL_COMMUNITY)
Admission: AD | Admit: 2019-09-03 | Discharge: 2019-09-07 | DRG: 784 | Disposition: A | Discharge status: Home / Self Care | Payer: Medicaid Other | Attending: Obstetrics and Gynecology | Admitting: Obstetrics and Gynecology

## 2019-09-03 ENCOUNTER — Inpatient Hospital Stay (HOSPITAL_COMMUNITY): Payer: Medicaid Other | Admitting: Anesthesiology

## 2019-09-03 ENCOUNTER — Telehealth: Payer: Self-pay | Admitting: Obstetrics & Gynecology

## 2019-09-03 DIAGNOSIS — O0990 Supervision of high risk pregnancy, unspecified, unspecified trimester: Secondary | ICD-10-CM

## 2019-09-03 DIAGNOSIS — O4292 Full-term premature rupture of membranes, unspecified as to length of time between rupture and onset of labor: Secondary | ICD-10-CM

## 2019-09-03 DIAGNOSIS — I10 Essential (primary) hypertension: Secondary | ICD-10-CM | POA: Diagnosis present

## 2019-09-03 DIAGNOSIS — F329 Major depressive disorder, single episode, unspecified: Secondary | ICD-10-CM | POA: Diagnosis present

## 2019-09-03 DIAGNOSIS — Z302 Encounter for sterilization: Secondary | ICD-10-CM

## 2019-09-03 DIAGNOSIS — D696 Thrombocytopenia, unspecified: Secondary | ICD-10-CM | POA: Diagnosis present

## 2019-09-03 DIAGNOSIS — O9912 Other diseases of the blood and blood-forming organs and certain disorders involving the immune mechanism complicating childbirth: Secondary | ICD-10-CM | POA: Diagnosis present

## 2019-09-03 DIAGNOSIS — Z8751 Personal history of pre-term labor: Secondary | ICD-10-CM

## 2019-09-03 DIAGNOSIS — O1002 Pre-existing essential hypertension complicating childbirth: Secondary | ICD-10-CM | POA: Diagnosis present

## 2019-09-03 DIAGNOSIS — Z20822 Contact with and (suspected) exposure to covid-19: Secondary | ICD-10-CM | POA: Diagnosis present

## 2019-09-03 DIAGNOSIS — O10919 Unspecified pre-existing hypertension complicating pregnancy, unspecified trimester: Secondary | ICD-10-CM | POA: Diagnosis present

## 2019-09-03 DIAGNOSIS — F1190 Opioid use, unspecified, uncomplicated: Secondary | ICD-10-CM | POA: Diagnosis not present

## 2019-09-03 DIAGNOSIS — O99324 Drug use complicating childbirth: Secondary | ICD-10-CM | POA: Diagnosis present

## 2019-09-03 DIAGNOSIS — Z3A37 37 weeks gestation of pregnancy: Secondary | ICD-10-CM | POA: Diagnosis not present

## 2019-09-03 DIAGNOSIS — F17210 Nicotine dependence, cigarettes, uncomplicated: Secondary | ICD-10-CM | POA: Diagnosis present

## 2019-09-03 DIAGNOSIS — F32A Depression, unspecified: Secondary | ICD-10-CM | POA: Diagnosis present

## 2019-09-03 DIAGNOSIS — O99323 Drug use complicating pregnancy, third trimester: Secondary | ICD-10-CM | POA: Diagnosis not present

## 2019-09-03 DIAGNOSIS — O99344 Other mental disorders complicating childbirth: Secondary | ICD-10-CM | POA: Diagnosis present

## 2019-09-03 DIAGNOSIS — F1120 Opioid dependence, uncomplicated: Secondary | ICD-10-CM | POA: Diagnosis present

## 2019-09-03 DIAGNOSIS — O26893 Other specified pregnancy related conditions, third trimester: Secondary | ICD-10-CM | POA: Diagnosis present

## 2019-09-03 DIAGNOSIS — Z3A38 38 weeks gestation of pregnancy: Secondary | ICD-10-CM | POA: Diagnosis not present

## 2019-09-03 DIAGNOSIS — O99334 Smoking (tobacco) complicating childbirth: Secondary | ICD-10-CM | POA: Diagnosis present

## 2019-09-03 DIAGNOSIS — Z720 Tobacco use: Secondary | ICD-10-CM | POA: Diagnosis present

## 2019-09-03 DIAGNOSIS — O99892 Other specified diseases and conditions complicating childbirth: Secondary | ICD-10-CM

## 2019-09-03 DIAGNOSIS — R8271 Bacteriuria: Secondary | ICD-10-CM | POA: Diagnosis present

## 2019-09-03 LAB — COMPREHENSIVE METABOLIC PANEL
ALT: 15 U/L (ref 0–44)
AST: 16 U/L (ref 15–41)
Albumin: 2.5 g/dL — ABNORMAL LOW (ref 3.5–5.0)
Alkaline Phosphatase: 172 U/L — ABNORMAL HIGH (ref 38–126)
Anion gap: 10 (ref 5–15)
BUN: 8 mg/dL (ref 6–20)
CO2: 21 mmol/L — ABNORMAL LOW (ref 22–32)
Calcium: 9 mg/dL (ref 8.9–10.3)
Chloride: 104 mmol/L (ref 98–111)
Creatinine, Ser: 0.58 mg/dL (ref 0.44–1.00)
GFR calc Af Amer: >60 mL/min (ref >60)
GFR calc non Af Amer: >60 mL/min (ref >60)
Glucose, Bld: 114 mg/dL — ABNORMAL HIGH (ref 70–99)
Potassium: 3.8 mmol/L (ref 3.5–5.1)
Sodium: 135 mmol/L (ref 135–145)
Total Bilirubin: 0.5 mg/dL (ref 0.3–1.2)
Total Protein: 6.6 g/dL (ref 6.5–8.1)

## 2019-09-03 LAB — RAPID URINE DRUG SCREEN, HOSP PERFORMED
Amphetamines: NOT DETECTED
Barbiturates: NOT DETECTED
Benzodiazepines: NOT DETECTED
Cocaine: NOT DETECTED
Opiates: NOT DETECTED
Tetrahydrocannabinol: NOT DETECTED

## 2019-09-03 LAB — TYPE AND SCREEN
ABO/RH(D): O POS
Antibody Screen: NEGATIVE

## 2019-09-03 LAB — CBC
HCT: 36.5 % (ref 36.0–46.0)
HCT: 35.7 % — ABNORMAL LOW (ref 36.0–46.0)
Hemoglobin: 12.5 g/dL (ref 12.0–15.0)
Hemoglobin: 12.2 g/dL (ref 12.0–15.0)
MCH: 30.7 pg (ref 26.0–34.0)
MCH: 30.4 pg (ref 26.0–34.0)
MCHC: 34.2 g/dL (ref 30.0–36.0)
MCHC: 34.2 g/dL (ref 30.0–36.0)
MCV: 89.7 fL (ref 80.0–100.0)
MCV: 89 fL (ref 80.0–100.0)
Platelets: 277 10*3/uL (ref 150–400)
Platelets: 255 K/uL (ref 150–400)
RBC: 4.07 MIL/uL (ref 3.87–5.11)
RBC: 4.01 MIL/uL (ref 3.87–5.11)
RDW: 12.2 % (ref 11.5–15.5)
RDW: 12.2 % (ref 11.5–15.5)
WBC: 14.1 10*3/uL — ABNORMAL HIGH (ref 4.0–10.5)
WBC: 13.1 K/uL — ABNORMAL HIGH (ref 4.0–10.5)
nRBC: 0 % (ref 0.0–0.2)
nRBC: 0 % (ref 0.0–0.2)

## 2019-09-03 LAB — RESPIRATORY PANEL BY RT PCR (FLU A&B, COVID)
Influenza A by PCR: NEGATIVE
Influenza B by PCR: NEGATIVE
SARS Coronavirus 2 by RT PCR: NEGATIVE

## 2019-09-03 LAB — PROTEIN / CREATININE RATIO, URINE
Creatinine, Urine: 233.24 mg/dL
Protein Creatinine Ratio: 0.18 mg/mg{Cre} — ABNORMAL HIGH (ref 0.00–0.15)
Total Protein, Urine: 42 mg/dL

## 2019-09-03 LAB — POCT FERN TEST: POCT Fern Test: POSITIVE

## 2019-09-03 MED ORDER — HYDROXYZINE HCL 25 MG PO TABS
25.0000 mg | ORAL_TABLET | Freq: Three times a day (TID) | ORAL | Status: DC | PRN
  Administered 2019-09-03 – 2019-09-07 (×5): 25 mg via ORAL
  Filled 2019-09-03 (×6): qty 1

## 2019-09-03 MED ORDER — LACTATED RINGERS IV SOLN
500.0000 mL | Freq: Once | INTRAVENOUS | Status: AC
  Administered 2019-09-03: 23:00:00 500 mL via INTRAVENOUS

## 2019-09-03 MED ORDER — SOD CITRATE-CITRIC ACID 500-334 MG/5ML PO SOLN
30.0000 mL | ORAL | Status: DC | PRN
  Filled 2019-09-03: qty 30

## 2019-09-03 MED ORDER — BUPRENORPHINE HCL 8 MG SL SUBL
8.0000 mg | SUBLINGUAL_TABLET | SUBLINGUAL | Status: DC
  Administered 2019-09-03 – 2019-09-07 (×15): 8 mg via SUBLINGUAL
  Filled 2019-09-03 (×15): qty 1

## 2019-09-03 MED ORDER — EPHEDRINE 5 MG/ML INJ: 10.0000 mg | INTRAVENOUS | Status: DC | PRN

## 2019-09-03 MED ORDER — SERTRALINE HCL 50 MG PO TABS
50.0000 mg | ORAL_TABLET | Freq: Every morning | ORAL | Status: DC
  Administered 2019-09-04 – 2019-09-07 (×4): 50 mg via ORAL
  Filled 2019-09-03 (×5): qty 1

## 2019-09-03 MED ORDER — BUPRENORPHINE HCL 8 MG SL SUBL: 8.0000 mg | SUBLINGUAL_TABLET | Freq: Four times a day (QID) | SUBLINGUAL | Status: DC

## 2019-09-03 MED ORDER — FENTANYL-BUPIVACAINE-NACL 0.5-0.125-0.9 MG/250ML-% EP SOLN
12.0000 mL/h | EPIDURAL | Status: DC | PRN
  Filled 2019-09-03: qty 250

## 2019-09-03 MED ORDER — MELATONIN 3 MG PO TABS
3.0000 mg | ORAL_TABLET | Freq: Every evening | ORAL | Status: DC | PRN
  Administered 2019-09-03: 3 mg via ORAL
  Filled 2019-09-03 (×2): qty 1

## 2019-09-03 MED ORDER — LIDOCAINE HCL (PF) 1 % IJ SOLN
30.0000 mL | INTRAMUSCULAR | Status: AC | PRN
  Administered 2019-09-03: 4 mL via SUBCUTANEOUS
  Administered 2019-09-03: 8 mL via SUBCUTANEOUS

## 2019-09-03 MED ORDER — PHENYLEPHRINE 40 MCG/ML (10ML) SYRINGE FOR IV PUSH (FOR BLOOD PRESSURE SUPPORT): 80.0000 ug | PREFILLED_SYRINGE | INTRAVENOUS | Status: DC | PRN

## 2019-09-03 MED ORDER — NICOTINE 21 MG/24HR TD PT24
21.0000 mg | MEDICATED_PATCH | Freq: Every day | TRANSDERMAL | Status: DC
  Administered 2019-09-03 – 2019-09-06 (×4): 21 mg via TRANSDERMAL
  Filled 2019-09-03 (×8): qty 1

## 2019-09-03 MED ORDER — LACTATED RINGERS IV SOLN
500.0000 mL | INTRAVENOUS | Status: DC | PRN
  Administered 2019-09-04 (×2): 500 mL via INTRAVENOUS

## 2019-09-03 MED ORDER — FENTANYL CITRATE (PF) 100 MCG/2ML IJ SOLN
50.0000 ug | INTRAMUSCULAR | Status: DC | PRN
  Administered 2019-09-03: 22:00:00 50 ug via INTRAVENOUS
  Filled 2019-09-03: qty 2

## 2019-09-03 MED ORDER — BUPRENORPHINE HCL 8 MG SL SUBL: 8.0000 mg | SUBLINGUAL_TABLET | Freq: Three times a day (TID) | SUBLINGUAL | Status: DC

## 2019-09-03 MED ORDER — LABETALOL HCL 100 MG PO TABS
100.0000 mg | ORAL_TABLET | Freq: Two times a day (BID) | ORAL | Status: DC
  Administered 2019-09-03 – 2019-09-07 (×8): 100 mg via ORAL
  Filled 2019-09-03 (×8): qty 1

## 2019-09-03 MED ORDER — LACTATED RINGERS IV SOLN: INTRAVENOUS | Status: DC

## 2019-09-03 MED ORDER — ONDANSETRON HCL 4 MG/2ML IJ SOLN: 4.0000 mg | Freq: Four times a day (QID) | INTRAMUSCULAR | Status: DC | PRN

## 2019-09-03 MED ORDER — OXYTOCIN 40 UNITS IN NORMAL SALINE INFUSION - SIMPLE MED: 2.5000 [IU]/h | INTRAVENOUS | Status: DC

## 2019-09-03 MED ORDER — SODIUM CHLORIDE (PF) 0.9 % IJ SOLN
INTRAMUSCULAR | Status: DC | PRN
  Administered 2019-09-03: 12 mL/h via EPIDURAL

## 2019-09-03 MED ORDER — LORATADINE 10 MG PO TABS
10.0000 mg | ORAL_TABLET | Freq: Every day | ORAL | Status: DC
  Administered 2019-09-04 – 2019-09-07 (×3): 10 mg via ORAL
  Filled 2019-09-03 (×4): qty 1

## 2019-09-03 MED ORDER — OXYTOCIN 40 UNITS IN NORMAL SALINE INFUSION - SIMPLE MED
1.0000 m[IU]/min | INTRAVENOUS | Status: DC
  Administered 2019-09-03: 2 m[IU]/min via INTRAVENOUS
  Filled 2019-09-03: qty 1000

## 2019-09-03 MED ORDER — DIPHENHYDRAMINE HCL 50 MG/ML IJ SOLN: 12.5000 mg | INTRAMUSCULAR | Status: DC | PRN

## 2019-09-03 MED ORDER — OXYTOCIN BOLUS FROM INFUSION: 500.0000 mL | Freq: Once | INTRAVENOUS | Status: DC

## 2019-09-03 MED ORDER — TERBUTALINE SULFATE 1 MG/ML IJ SOLN
0.2500 mg | Freq: Once | INTRAMUSCULAR | Status: AC | PRN
  Administered 2019-09-04: 07:00:00 0.25 mg via SUBCUTANEOUS
  Filled 2019-09-03: qty 1

## 2019-09-03 MED ORDER — ACETAMINOPHEN 325 MG PO TABS: 650.0000 mg | ORAL_TABLET | ORAL | Status: DC | PRN
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
## 2019-09-03 NOTE — Anesthesia Procedure Notes (Signed)
Epidural Patient location during procedure: OB Start time: 09/03/2019 11:03 PM End time: 09/03/2019 11:05 PM  Staffing Anesthesiologist: Lyn Hollingshead, MD Performed: anesthesiologist   Preanesthetic Checklist Completed: patient identified, IV checked, site marked, risks and benefits discussed, surgical consent, monitors and equipment checked, pre-op evaluation and timeout performed  Epidural Patient position: sitting Prep: DuraPrep and site prepped and draped Patient monitoring: continuous pulse ox and blood pressure Approach: midline Location: L3-L4 Injection technique: LOR air  Needle:  Needle type: Tuohy  Needle gauge: 17 G Needle length: 9 cm and 9 Needle insertion depth: 5 cm cm Catheter type: closed end flexible Catheter size: 19 Gauge Catheter at skin depth: 10 cm Test dose: negative and Other  Assessment Events: blood not aspirated, injection not painful, no injection resistance, no paresthesia and negative IV test  Additional Notes Reason for block:procedure for pain

## 2019-09-03 NOTE — MAU Provider Note (Signed)
Pt informed that the ultrasound is considered a limited OB ultrasound and is not intended to be a complete ultrasound exam.  Patient also informed that the ultrasound is not being completed with the intent of assessing for fetal or placental anomalies or any pelvic abnormalities.  Explained that the purpose of today's ultrasound is to assess for  presentation.  Patient acknowledges the purpose of the exam and the limitations of the study.     Vertex position.  Noni Saupe I, NP 09/03/2019 1:10 PM
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
## 2019-09-03 NOTE — Anesthesia Preprocedure Evaluation (Signed)
Anesthesia Evaluation  Patient identified by MRN, date of birth, ID band Patient awake    Reviewed: Allergy & Precautions, H&P , Patient's Chart, lab work & pertinent test results  Airway Mallampati: I  TM Distance: >3 FB Neck ROM: full    Dental no notable dental hx. (+) Teeth Intact   Pulmonary Current Smoker,    Pulmonary exam normal breath sounds clear to auscultation       Cardiovascular hypertension, Pt. on home beta blockers Normal cardiovascular exam Rhythm:regular Rate:Normal     Neuro/Psych    GI/Hepatic negative GI ROS, Neg liver ROS,   Endo/Other  negative endocrine ROS  Renal/GU negative Renal ROS     Musculoskeletal   Abdominal (+) + obese,   Peds  Hematology   Anesthesia Other Findings   Reproductive/Obstetrics (+) Pregnancy                             Anesthesia Physical Anesthesia Plan  ASA: II  Anesthesia Plan: Epidural   Post-op Pain Management:    Induction:   PONV Risk Score and Plan:   Airway Management Planned:   Additional Equipment:   Intra-op Plan:   Post-operative Plan:   Informed Consent: I have reviewed the patients History and Physical, chart, labs and discussed the procedure including the risks, benefits and alternatives for the proposed anesthesia with the patient or authorized representative who has indicated his/her understanding and acceptance.       Plan Discussed with:   Anesthesia Plan Comments:         Anesthesia Quick Evaluation
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
## 2019-09-03 NOTE — Telephone Encounter (Signed)
Patient called and stated that her water broke and she's on the way to the hospital.

## 2019-09-04 ENCOUNTER — Other Ambulatory Visit: Payer: Medicaid Other

## 2019-09-04 ENCOUNTER — Encounter (HOSPITAL_COMMUNITY)
Admission: AD | Disposition: A | Discharge status: Home / Self Care | Payer: Self-pay | Source: Home / Self Care | Attending: Obstetrics and Gynecology

## 2019-09-04 ENCOUNTER — Encounter (HOSPITAL_COMMUNITY): Payer: Self-pay | Admitting: Obstetrics and Gynecology

## 2019-09-04 DIAGNOSIS — O99892 Other specified diseases and conditions complicating childbirth: Secondary | ICD-10-CM

## 2019-09-04 DIAGNOSIS — F1190 Opioid use, unspecified, uncomplicated: Secondary | ICD-10-CM

## 2019-09-04 DIAGNOSIS — Z302 Encounter for sterilization: Secondary | ICD-10-CM

## 2019-09-04 DIAGNOSIS — Z3A37 37 weeks gestation of pregnancy: Secondary | ICD-10-CM

## 2019-09-04 DIAGNOSIS — O99323 Drug use complicating pregnancy, third trimester: Secondary | ICD-10-CM

## 2019-09-04 LAB — RPR: RPR Ser Ql: NONREACTIVE

## 2019-09-04 LAB — ABO/RH: ABO/RH(D): O POS

## 2019-09-04 SURGERY — Surgical Case
Anesthesia: Epidural

## 2019-09-04 MED ORDER — FENTANYL CITRATE (PF) 100 MCG/2ML IJ SOLN: INTRAMUSCULAR | Status: DC | PRN

## 2019-09-04 MED ORDER — LACTATED RINGERS IV SOLN: INTRAVENOUS | Status: DC

## 2019-09-04 MED ORDER — DIPHENHYDRAMINE HCL 50 MG/ML IJ SOLN: 12.5000 mg | INTRAMUSCULAR | Status: DC | PRN

## 2019-09-04 MED ORDER — PRENATAL MULTIVITAMIN CH
1.0000 | ORAL_TABLET | Freq: Every day | ORAL | Status: DC
  Administered 2019-09-04 – 2019-09-07 (×4): 1 via ORAL
  Filled 2019-09-04 (×4): qty 1

## 2019-09-04 MED ORDER — SCOPOLAMINE 1 MG/3DAYS TD PT72: 1.0000 | MEDICATED_PATCH | Freq: Once | TRANSDERMAL | Status: DC

## 2019-09-04 MED ORDER — ACETAMINOPHEN 10 MG/ML IV SOLN
1000.0000 mg | Freq: Once | INTRAVENOUS | Status: DC | PRN
  Administered 2019-09-04: 09:00:00 1000 mg via INTRAVENOUS

## 2019-09-04 MED ORDER — OXYCODONE HCL 5 MG PO TABS
10.0000 mg | ORAL_TABLET | Freq: Once | ORAL | Status: AC
  Administered 2019-09-04: 10 mg via ORAL

## 2019-09-04 MED ORDER — ACETAMINOPHEN 500 MG PO TABS
1000.0000 mg | ORAL_TABLET | Freq: Four times a day (QID) | ORAL | Status: AC
  Administered 2019-09-04 – 2019-09-05 (×3): 1000 mg via ORAL
  Filled 2019-09-04 (×3): qty 2

## 2019-09-04 MED ORDER — FENTANYL CITRATE (PF) 250 MCG/5ML IJ SOLN
INTRAMUSCULAR | Status: AC
  Filled 2019-09-04: qty 5

## 2019-09-04 MED ORDER — WITCH HAZEL-GLYCERIN EX PADS: 1.0000 "application " | MEDICATED_PAD | CUTANEOUS | Status: DC | PRN

## 2019-09-04 MED ORDER — SODIUM CHLORIDE 0.9 % IR SOLN
Status: DC | PRN
  Administered 2019-09-04: 1000 mL

## 2019-09-04 MED ORDER — DIPHENHYDRAMINE HCL 25 MG PO CAPS: 25.0000 mg | ORAL_CAPSULE | ORAL | Status: DC | PRN

## 2019-09-04 MED ORDER — NALBUPHINE HCL 10 MG/ML IJ SOLN: 5.0000 mg | INTRAMUSCULAR | Status: DC | PRN

## 2019-09-04 MED ORDER — FENTANYL CITRATE (PF) 250 MCG/5ML IJ SOLN
INTRAMUSCULAR | Status: DC | PRN
  Administered 2019-09-04: 100 ug via INTRAVENOUS
  Administered 2019-09-04: 50 ug via INTRAVENOUS

## 2019-09-04 MED ORDER — DIPHENHYDRAMINE HCL 25 MG PO CAPS: 25.0000 mg | ORAL_CAPSULE | Freq: Four times a day (QID) | ORAL | Status: DC | PRN

## 2019-09-04 MED ORDER — AMLODIPINE BESYLATE 5 MG PO TABS
10.0000 mg | ORAL_TABLET | Freq: Every day | ORAL | Status: DC
  Administered 2019-09-04 – 2019-09-07 (×4): 10 mg via ORAL
  Filled 2019-09-04 (×5): qty 2

## 2019-09-04 MED ORDER — OXYTOCIN 40 UNITS IN NORMAL SALINE INFUSION - SIMPLE MED
INTRAVENOUS | Status: AC
  Filled 2019-09-04: qty 1000

## 2019-09-04 MED ORDER — SODIUM CHLORIDE 0.9 % IV SOLN: INTRAVENOUS | Status: DC | PRN

## 2019-09-04 MED ORDER — TETANUS-DIPHTH-ACELL PERTUSSIS 5-2.5-18.5 LF-MCG/0.5 IM SUSP: 0.5000 mL | Freq: Once | INTRAMUSCULAR | Status: DC

## 2019-09-04 MED ORDER — KETOROLAC TROMETHAMINE 30 MG/ML IJ SOLN: 30.0000 mg | Freq: Once | INTRAMUSCULAR | Status: DC | PRN

## 2019-09-04 MED ORDER — FENTANYL CITRATE (PF) 100 MCG/2ML IJ SOLN
INTRAMUSCULAR | Status: AC
  Filled 2019-09-04: qty 2

## 2019-09-04 MED ORDER — CEFAZOLIN SODIUM-DEXTROSE 2-3 GM-%(50ML) IV SOLR
INTRAVENOUS | Status: DC | PRN
  Administered 2019-09-04: 2 g via INTRAVENOUS

## 2019-09-04 MED ORDER — NALBUPHINE HCL 10 MG/ML IJ SOLN: 5.0000 mg | Freq: Once | INTRAMUSCULAR | Status: DC | PRN

## 2019-09-04 MED ORDER — OXYCODONE HCL 5 MG PO TABS
5.0000 mg | ORAL_TABLET | ORAL | Status: DC | PRN
  Administered 2019-09-04 – 2019-09-07 (×11): 10 mg via ORAL
  Filled 2019-09-04 (×12): qty 2

## 2019-09-04 MED ORDER — SENNOSIDES-DOCUSATE SODIUM 8.6-50 MG PO TABS
2.0000 | ORAL_TABLET | ORAL | Status: DC
  Administered 2019-09-04 – 2019-09-06 (×3): 2 via ORAL
  Filled 2019-09-04 (×3): qty 2

## 2019-09-04 MED ORDER — ZOLPIDEM TARTRATE 5 MG PO TABS: 5.0000 mg | ORAL_TABLET | Freq: Every evening | ORAL | Status: DC | PRN

## 2019-09-04 MED ORDER — KETOROLAC TROMETHAMINE 30 MG/ML IJ SOLN
30.0000 mg | Freq: Four times a day (QID) | INTRAMUSCULAR | Status: AC | PRN
  Administered 2019-09-04: 09:00:00 30 mg via INTRAMUSCULAR

## 2019-09-04 MED ORDER — DIBUCAINE (PERIANAL) 1 % EX OINT: 1.0000 "application " | TOPICAL_OINTMENT | CUTANEOUS | Status: DC | PRN

## 2019-09-04 MED ORDER — ONDANSETRON HCL 4 MG/2ML IJ SOLN
INTRAMUSCULAR | Status: DC | PRN
  Administered 2019-09-04: 4 mg via INTRAVENOUS

## 2019-09-04 MED ORDER — CEFAZOLIN SODIUM-DEXTROSE 2-4 GM/100ML-% IV SOLN
INTRAVENOUS | Status: AC
  Filled 2019-09-04: qty 100

## 2019-09-04 MED ORDER — ONDANSETRON HCL 4 MG/2ML IJ SOLN: 4.0000 mg | Freq: Three times a day (TID) | INTRAMUSCULAR | Status: DC | PRN

## 2019-09-04 MED ORDER — KETOROLAC TROMETHAMINE 30 MG/ML IJ SOLN
INTRAMUSCULAR | Status: AC
  Filled 2019-09-04: qty 1

## 2019-09-04 MED ORDER — SIMETHICONE 80 MG PO CHEW
80.0000 mg | CHEWABLE_TABLET | Freq: Three times a day (TID) | ORAL | Status: DC
  Administered 2019-09-04 – 2019-09-07 (×7): 80 mg via ORAL
  Filled 2019-09-04 (×7): qty 1

## 2019-09-04 MED ORDER — MORPHINE SULFATE (PF) 0.5 MG/ML IJ SOLN
INTRAMUSCULAR | Status: AC
  Filled 2019-09-04: qty 10

## 2019-09-04 MED ORDER — FENTANYL CITRATE (PF) 100 MCG/2ML IJ SOLN
INTRAMUSCULAR | Status: DC | PRN
  Administered 2019-09-04: 100 ug via EPIDURAL

## 2019-09-04 MED ORDER — ACETAMINOPHEN 325 MG PO TABS
650.0000 mg | ORAL_TABLET | Freq: Four times a day (QID) | ORAL | Status: DC | PRN
  Administered 2019-09-07 (×2): 650 mg via ORAL
  Filled 2019-09-04 (×2): qty 2

## 2019-09-04 MED ORDER — LACTATED RINGERS IV SOLN: INTRAVENOUS | Status: DC | PRN

## 2019-09-04 MED ORDER — LIDOCAINE-EPINEPHRINE (PF) 2 %-1:200000 IJ SOLN
INTRAMUSCULAR | Status: DC | PRN
  Administered 2019-09-04: 10 mL via EPIDURAL
  Administered 2019-09-04: 5 mL via EPIDURAL

## 2019-09-04 MED ORDER — MENTHOL 3 MG MT LOZG: 1.0000 | LOZENGE | OROMUCOSAL | Status: DC | PRN

## 2019-09-04 MED ORDER — LIDOCAINE-EPINEPHRINE (PF) 2 %-1:200000 IJ SOLN
INTRAMUSCULAR | Status: AC
  Filled 2019-09-04: qty 10

## 2019-09-04 MED ORDER — ACETAMINOPHEN 10 MG/ML IV SOLN
INTRAVENOUS | Status: AC
  Filled 2019-09-04: qty 100

## 2019-09-04 MED ORDER — NALOXONE HCL 4 MG/10ML IJ SOLN
1.0000 ug/kg/h | INTRAVENOUS | Status: DC | PRN
  Filled 2019-09-04: qty 5

## 2019-09-04 MED ORDER — ONDANSETRON HCL 4 MG/2ML IJ SOLN
INTRAMUSCULAR | Status: AC
  Filled 2019-09-04: qty 2

## 2019-09-04 MED ORDER — FENTANYL CITRATE (PF) 100 MCG/2ML IJ SOLN
25.0000 ug | INTRAMUSCULAR | Status: DC | PRN
  Administered 2019-09-04: 09:00:00 50 ug via INTRAVENOUS

## 2019-09-04 MED ORDER — KETOROLAC TROMETHAMINE 30 MG/ML IJ SOLN
30.0000 mg | Freq: Four times a day (QID) | INTRAMUSCULAR | Status: AC
  Administered 2019-09-04 – 2019-09-05 (×4): 30 mg via INTRAVENOUS
  Filled 2019-09-04 (×4): qty 1

## 2019-09-04 MED ORDER — IBUPROFEN 800 MG PO TABS
800.0000 mg | ORAL_TABLET | Freq: Four times a day (QID) | ORAL | Status: DC
  Administered 2019-09-05 – 2019-09-07 (×8): 800 mg via ORAL
  Filled 2019-09-04 (×8): qty 1

## 2019-09-04 MED ORDER — MORPHINE SULFATE (PF) 0.5 MG/ML IJ SOLN
INTRAMUSCULAR | Status: DC | PRN
  Administered 2019-09-04: 3 mg via EPIDURAL

## 2019-09-04 MED ORDER — NALOXONE HCL 0.4 MG/ML IJ SOLN: 0.4000 mg | INTRAMUSCULAR | Status: DC | PRN

## 2019-09-04 MED ORDER — SIMETHICONE 80 MG PO CHEW: 80.0000 mg | CHEWABLE_TABLET | ORAL | Status: DC | PRN

## 2019-09-04 MED ORDER — OXYTOCIN 10 UNIT/ML IJ SOLN
INTRAMUSCULAR | Status: DC | PRN
  Administered 2019-09-04: 40 [IU] via INTRAMUSCULAR

## 2019-09-04 MED ORDER — OXYTOCIN 40 UNITS IN NORMAL SALINE INFUSION - SIMPLE MED: 2.5000 [IU]/h | INTRAVENOUS | Status: AC

## 2019-09-04 MED ORDER — SIMETHICONE 80 MG PO CHEW
80.0000 mg | CHEWABLE_TABLET | ORAL | Status: DC
  Administered 2019-09-04 – 2019-09-06 (×3): 80 mg via ORAL
  Filled 2019-09-04 (×3): qty 1

## 2019-09-04 MED ORDER — SODIUM CHLORIDE 0.9% FLUSH: 3.0000 mL | INTRAVENOUS | Status: DC | PRN

## 2019-09-04 MED ORDER — ENOXAPARIN SODIUM 60 MG/0.6ML ~~LOC~~ SOLN
0.5000 mg/kg | SUBCUTANEOUS | Status: DC
  Administered 2019-09-05 – 2019-09-07 (×3): 45 mg via SUBCUTANEOUS
  Filled 2019-09-04 (×3): qty 0.6

## 2019-09-04 MED ORDER — COCONUT OIL OIL: 1.0000 "application " | TOPICAL_OIL | Status: DC | PRN

## 2019-09-04 MED ORDER — KETOROLAC TROMETHAMINE 30 MG/ML IJ SOLN: 30.0000 mg | Freq: Four times a day (QID) | INTRAMUSCULAR | Status: AC | PRN

## 2019-09-04 SURGICAL SUPPLY — 44 items
ADH SKN CLS APL DERMABOND .7 (GAUZE/BANDAGES/DRESSINGS) ×1
APL PRP STRL LF DISP 70% ISPRP (MISCELLANEOUS) ×1
APL SKNCLS STERI-STRIP NONHPOA (GAUZE/BANDAGES/DRESSINGS) ×1
BENZOIN TINCTURE PRP APPL 2/3 (GAUZE/BANDAGES/DRESSINGS) ×2 IMPLANT
CHLORAPREP W/TINT 26 (MISCELLANEOUS) ×3 IMPLANT
CLAMP CORD UMBIL (MISCELLANEOUS) ×3 IMPLANT
CLOSURE WOUND 1/2 X4 (GAUZE/BANDAGES/DRESSINGS) ×1
CLOTH BEACON ORANGE TIMEOUT ST (SAFETY) ×3 IMPLANT
DERMABOND ADVANCED (GAUZE/BANDAGES/DRESSINGS) ×2
DERMABOND ADVANCED .7 DNX12 (GAUZE/BANDAGES/DRESSINGS) ×1 IMPLANT
DRSG OPSITE POSTOP 4X10 (GAUZE/BANDAGES/DRESSINGS) ×3 IMPLANT
ELECT REM PT RETURN 9FT ADLT (ELECTROSURGICAL) ×3
ELECTRODE REM PT RTRN 9FT ADLT (ELECTROSURGICAL) ×1 IMPLANT
EXTRACTOR VACUUM KIWI (MISCELLANEOUS) IMPLANT
GAUZE SPONGE 4X4 12PLY STRL LF (GAUZE/BANDAGES/DRESSINGS) ×4 IMPLANT
GLOVE BIO SURGEON STRL SZ 6 (GLOVE) ×3 IMPLANT
GLOVE BIOGEL PI IND STRL 6 (GLOVE) ×1 IMPLANT
GLOVE BIOGEL PI IND STRL 7.0 (GLOVE) ×2 IMPLANT
GLOVE BIOGEL PI INDICATOR 6 (GLOVE) ×2
GLOVE BIOGEL PI INDICATOR 7.0 (GLOVE) ×4
GOWN STRL REUS W/TWL LRG LVL3 (GOWN DISPOSABLE) ×6 IMPLANT
KIT ABG SYR 3ML LUER SLIP (SYRINGE) ×3 IMPLANT
NDL HYPO 25X5/8 SAFETYGLIDE (NEEDLE) ×1 IMPLANT
NEEDLE HYPO 25X5/8 SAFETYGLIDE (NEEDLE) ×3 IMPLANT
NS IRRIG 1000ML POUR BTL (IV SOLUTION) ×3 IMPLANT
PACK C SECTION WH (CUSTOM PROCEDURE TRAY) ×3 IMPLANT
PAD ABD 7.5X8 STRL (GAUZE/BANDAGES/DRESSINGS) ×2 IMPLANT
PAD OB MATERNITY 4.3X12.25 (PERSONAL CARE ITEMS) ×3 IMPLANT
PENCIL SMOKE EVAC W/HOLSTER (ELECTROSURGICAL) ×3 IMPLANT
RTRCTR C-SECT PINK 25CM LRG (MISCELLANEOUS) IMPLANT
SPONGE LAP 18X18 RF (DISPOSABLE) ×2 IMPLANT
STRIP CLOSURE SKIN 1/2X4 (GAUZE/BANDAGES/DRESSINGS) ×1 IMPLANT
SUT MNCRL 0 VIOLET CTX 36 (SUTURE) IMPLANT
SUT MON AB 3-0 SH 27 (SUTURE) ×3
SUT MON AB 3-0 SH27 (SUTURE) ×1 IMPLANT
SUT MON AB-0 CT1 36 (SUTURE) IMPLANT
SUT MONOCRYL 0 CTX 36 (SUTURE) ×6
SUT VIC AB 0 CT1 36 (SUTURE) ×8 IMPLANT
SUT VIC AB 3-0 CT1 27 (SUTURE) ×3
SUT VIC AB 3-0 CT1 TAPERPNT 27 (SUTURE) ×1 IMPLANT
SUT VIC AB 4-0 KS 27 (SUTURE) ×3 IMPLANT
TOWEL OR 17X24 6PK STRL BLUE (TOWEL DISPOSABLE) ×3 IMPLANT
TRAY FOLEY W/BAG SLVR 14FR LF (SET/KITS/TRAYS/PACK) ×3 IMPLANT
WATER STERILE IRR 1000ML POUR (IV SOLUTION) ×3 IMPLANT
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
## 2019-09-04 NOTE — Discharge Summary (Signed)
Postpartum Discharge Summary      Patient Name: Brianna Hughes DOB: 07-22-1985 MRN: [id number]  Date of admission: 09/03/2019 Delivering Provider: Chauncey Mann   Date of discharge: 09/07/2019  Admitting diagnosis: Normal labor [O80, Z37.9] Intrauterine pregnancy: [redacted]w[redacted]d    Secondary diagnosis:  Active Problems:   Thrombocytopenia, unspecified (HCC)   Tobacco abuse   Depression   Chronic hypertension affecting pregnancy   History of preterm delivery, currently pregnant   Asymptomatic bacteriuria during pregnancy in first trimester   Request for sterilization   Normal labor   Fetal bradycardia, delivered   Delivery by emergency cesarean section  Additional problems: None     Discharge diagnosis: Term Pregnancy Delivered and CHTN                                                                                                Post partum procedures: BTL (done at time of CS)  Augmentation: AROM, Pitocin and Foley Balloon  Complications: fetal bradycardia  Hospital course:  Onset of Labor With Unplanned C/S  34y.o. yo [id number] admitted in Latent Labor on 09/03/2019. Patient had a labor course significant for SROM that occurred at 0650 on 4/26. Foley bulb was placed to augment labor and patient was eventually given Pitocin. AROM performed. Membrane Rupture Time/Date: 6:50 AM ,09/03/2019   The patient went for cesarean section due to Non-Reassuring FHR and fetal bradycardia, and delivered a Viable infant,09/04/2019  Details of operation can be found in separate operative note. Bilateral salpingectomy done. Patient had an uncomplicated postpartum course. She was continued on home Subutex. Prenatal Labetalol discontinued and patient started on Norvasc 10 mg with good BP control. She is ambulating,tolerating a regular diet, passing flatus, and urinating well.  Patient is discharged home in stable condition 09/07/19. Delivery time: 7:03 AM    Magnesium Sulfate received:  No BMZ received: No Rhophylac:No MMR:No Transfusion:No  Physical exam  Vitals:   09/06/19 0533 09/06/19 0956 09/06/19 2102 09/07/19 0534  BP: (!) 144/91 (!) 142/85 124/87 131/87  Pulse: 72 75 81 66  Resp: 18   18  Temp: 98.4 F (36.9 C)  98.1 F (36.7 C) 98 F (36.7 C)  TempSrc:   Oral Oral  SpO2: 97%  98% 97%  Weight:      Height:       General: alert, cooperative and no distress Lochia: appropriate Uterine Fundus: firm Incision: Healing well with no significant drainage, No significant erythema, Dressing is clean, dry, and intact DVT Evaluation: No evidence of DVT seen on physical exam. Negative Homan's sign. No cords or calf tenderness. Labs: Lab Results  Component Value Date   WBC 10.8 (H) 09/05/2019   HGB 10.4 (L) 09/05/2019   HCT 31.0 (L) 09/05/2019   MCV 91.2 09/05/2019   PLT 247 09/05/2019   CMP Latest Ref Rng & Units 09/03/2019  Glucose 70 - 99 mg/dL 114(H)  BUN 6 - 20 mg/dL 8  Creatinine 0.44 - 1.00 mg/dL 0.58  Sodium 135 - 145 mmol/L 135  Potassium 3.5 - 5.1 mmol/L 3.8  Chloride 98 -  111 mmol/L 104  CO2 22 - 32 mmol/L 21(L)  Calcium 8.9 - 10.3 mg/dL 9.0  Total Protein 6.5 - 8.1 g/dL 6.6  Total Bilirubin 0.3 - 1.2 mg/dL 0.5  Alkaline Phos 38 - 126 U/L 172(H)  AST 15 - 41 U/L 16  ALT 0 - 44 U/L 15   Edinburgh Score: Edinburgh Postnatal Depression Scale Screening Tool 09/05/2019  I have been able to laugh and see the funny side of things. 0  I have looked forward with enjoyment to things. 0  I have blamed myself unnecessarily when things went wrong. 1  I have been anxious or worried for no good reason. 0  I have felt scared or panicky for no good reason. 0  Things have been getting on top of me. 1  I have been so unhappy that I have had difficulty sleeping. 0  I have felt sad or miserable. 0  I have been so unhappy that I have been crying. 0  The thought of harming myself has occurred to me. 0  Edinburgh Postnatal Depression Scale Total 2     Discharge instruction: per After Visit Summary and "Baby and Me Booklet".  After visit meds:  Allergies as of 09/07/2019      Reactions   Hydrocodone Nausea And Vomiting   Tramadol Other (See Comments)   Seizure   Adhesive [tape] Rash   Skin blisters from pressure dressingtape used after c-section      Medication List    STOP taking these medications   hydrOXYzine 25 MG tablet Commonly known as: ATARAX/VISTARIL   labetalol 100 MG tablet Commonly known as: NORMODYNE   loratadine 10 MG tablet Commonly known as: CLARITIN   MELATONIN PO     TAKE these medications   amLODipine 10 MG tablet Commonly known as: NORVASC Take 1 tablet (10 mg total) by mouth daily.   buprenorphine 8 MG Subl SL tablet Commonly known as: SUBUTEX Place 8 mg under the tongue every 8 (eight) hours. Every 8 hrs.   gabapentin 300 MG capsule Commonly known as: Neurontin Take 1 capsule (300 mg total) by mouth 3 (three) times daily for 10 days.   ibuprofen 800 MG tablet Commonly known as: ADVIL Take 1 tablet (800 mg total) by mouth every 6 (six) hours.   nicotine 21 mg/24hr patch Commonly known as: NICODERM CQ - dosed in mg/24 hours Place 1 patch (21 mg total) onto the skin daily.   oxyCODONE 5 MG immediate release tablet Commonly known as: Roxicodone Take 1 tablet (5 mg total) by mouth every 8 (eight) hours as needed for up to 3 days for severe pain.   sertraline 50 MG tablet Commonly known as: ZOLOFT Take 1 tablet (50 mg total) by mouth every morning.       Diet: appropriate  Activity: Advance as tolerated. Pelvic rest for 6 weeks.   Outpatient follow up:4 weeks Follow up Appt: Future Appointments  Date Time Provider [place]  09/11/2019  1:50 PM Florian Buff, MD [facility]  10/09/2019  2:30 PM Roma Schanz, CNM [facility]   Follow up Visit: Follow-up Information    [facility] Follow up on 09/11/2019.   Specialty: Obstetrics and  Gynecology Why: for blood pressure and incision check and 10/09/19 for postpartum checkup Contact information: [address] [place] [phone]            Please schedule this patient for Postpartum visit in: 4 weeks  with the following provider: Any provider In-Person For C/S patients schedule nurse incision check in weeks 1 weeks: yes High risk pregnancy complicated by: HTN Delivery mode:  CS Anticipated Birth Control:  salpingectomy PP Procedures needed: BP check and incision check at 1 week Schedule Integrated BH visit: no     Newborn Data: Live born female  Birth Weight: 4 lb 15 oz (2240 g) APGAR: 21, 8  Newborn Delivery   Birth date/time: 09/04/2019 07:03:00 Delivery type: C-Section, Low Transverse Trial of labor: Yes C-section categorization: Primary      Baby Feeding: Breast Disposition:home with mother (rooming in for several days d/t subutex use)   09/07/2019 Christin Fudge, CNM

## 2019-09-04 NOTE — Op Note (Signed)
Brianna Hughes PROCEDURE DATE: 09/04/2019  PREOPERATIVE DIAGNOSES: Intrauterine pregnancy at [redacted]w[redacted]d weeks gestation; non-reassuring fetal status and fetal bradycardia; undesired fertility  POSTOPERATIVE DIAGNOSES: The same  PROCEDURE: Primary Low Transverse Cesarean Section, Bilateral Tubal Salpingectomy   SURGEON:  Dr. Debbrah Alar - Primary Dr. Barrington Ellison - Fellow  ANESTHESIOLOGIST: Dr. Lanetta Inch   INDICATIONS: Brianna Hughes is a 34 y.o. [phone] at [redacted]w[redacted]d here for cesarean section and bilateral tubal sterilization secondary to the indications listed under preoperative diagnoses; please see preoperative note for further details. The risks of surgery were briefly discussed with the patient due to stat nature including but were not limited to: bleeding which may require transfusion or reoperation; infection which may require antibiotics; injury to bowel, bladder, ureters or other surrounding organs; injury to the fetus; need for additional procedures including hysterectomy in the event of a life-threatening hemorrhage; placental abnormalities wth subsequent pregnancies, incisional problems, thromboembolic phenomenon and other postoperative/anesthesia complications.  Patient also desires permanent sterilization.  Other reversible forms of contraception were discussed with patient; she declines all other modalities. Risks of procedure discussed with patient including but not limited to: risk of regret, permanence of method, bleeding, infection, injury to surrounding organs and need for additional procedures.  Failure risk of 1-2% with increased risk of ectopic gestation if pregnancy occurs was also discussed with patient.  Also discussed possibility of post-tubal pain syndrome. The patient concurred with the proposed plan, giving informed written consent for the procedures.    FINDINGS:  Viable female infant in cephalic OP presentation with tight double nuchal and minimal Wharton's Jelly present. Clear  amniotic fluid.  Intact placenta, three vessel cord.  Normal uterus, fallopian tubes and ovaries bilaterally. Fallopian tubes were sterilized bilaterally. Weight: 2240g APGAR (1 MIN): 8   APGAR (5 MINS): 8   APGAR (10 MINS):    ANESTHESIA: Epidural INTRAVENOUS FLUIDS: 1300 ml ESTIMATED BLOOD LOSS: 527 ml URINE OUTPUT:  300 ml SPECIMENS: Placenta sent to L&D and bilateral fallopian tubes sent to pathology COMPLICATIONS: None immediate  PROCEDURE IN DETAIL:  The patient preoperatively received intravenous antibiotics and had sequential compression devices applied to her lower extremities.   She was then taken to the operating room where the epidural anesthesia was dosed up to surgical level and was found to be adequate. She was then placed in a dorsal supine position with a leftward tilt, and prepped and draped in a stat sterile manner.  A foley catheter had been previously placed into her bladder and attached to constant gravity.  A Pfannenstiel skin incision was made with scalpel and carried through to the underlying layer of fascia. The fascia was incised in the midline, and this incision was extended bilaterally using blunt traction. The rectus muscles were separated in the midline and the peritoneum was entered bluntly. Bladder blade inserted. Attention was turned to the lower uterine segment where a low transverse hysterotomy was made with a scalpel and extended bilaterally bluntly.  The infant was successfully delivered, the cord was clamped and cut after one minute, and the infant was handed over to the awaiting neonatology team. Unable to obtain cord pH. Uterine massage was then administered, and the placenta delivered intact with a three-vessel cord. The uterus was then cleared of clots and debris.  The hysterotomy was closed with 0 Monocryl in a running locked fashion. Figure-of-eight 0 Monocryl serosal stitches were placed to help with hemostasis. Attention was then turned to the fallopian  tubes, where the left fallopian tube was identified  and grasped with a Babcock clamp, and followed out to the fimbriated end.  A Kelly used to clamp the mesosalpinx and the tube removed. 2-0 Monocryl in Heaney stitch, followed by free tie ensured hemostasis. Due to length of tube, in order to remove more proximal portion of tube, Chromic suture was entered through avascular plane of mesosalpinx and tied off.  A similar process was carried out on the right side allowing for bilateral tubal sterilization.  Good hemostasis was noted overall. The pelvis was cleared of all clot and debris. Hemostasis was confirmed on all surfaces.  The retractor was removed. The fascia was then closed using 0 Vicryl in a running fashion.  The subcutaneous layer was irrigated, reapproximated with 2-0 plain gut running stitches and the skin was closed with a 4-0 Vicryl subcuticular stitch. The patient tolerated the procedure well. Sponge, instrument and needle counts were correct x 3.  She was taken to the recovery room in stable condition.   Barrington Ellison, MD [facility] Family Medicine Fellow, [facility] for [employer], [place]
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
## 2019-09-04 NOTE — Transfer of Care (Signed)
Immediate Anesthesia Transfer of Care Note  Patient: Brianna Hughes  Procedure(s) Performed: CESAREAN SECTION  Patient Location: PACU  Anesthesia Type:Epidural  Level of Consciousness: awake  Airway & Oxygen Therapy: Patient Spontanous Breathing  Post-op Assessment: Report given to RN and Post -op Vital signs reviewed and stable  Post vital signs: Reviewed and stable  Last Vitals:  Vitals Value Taken Time  BP 124/86 09/04/19 0811  Temp    Pulse 83 09/04/19 0815  Resp 17 09/04/19 0815  SpO2 100 % 09/04/19 0815  Vitals shown include unvalidated device data.  Last Pain:  Vitals:   09/04/19 0431  TempSrc:   PainSc: 0-No pain         Complications: No apparent anesthesia complications

## 2019-09-05 ENCOUNTER — Encounter: Payer: Self-pay | Admitting: *Deleted

## 2019-09-05 LAB — CBC
HCT: 31 % — ABNORMAL LOW (ref 36.0–46.0)
Hemoglobin: 10.4 g/dL — ABNORMAL LOW (ref 12.0–15.0)
MCH: 30.6 pg (ref 26.0–34.0)
MCHC: 33.5 g/dL (ref 30.0–36.0)
MCV: 91.2 fL (ref 80.0–100.0)
Platelets: 247 10*3/uL (ref 150–400)
RBC: 3.4 MIL/uL — ABNORMAL LOW (ref 3.87–5.11)
RDW: 12.3 % (ref 11.5–15.5)
WBC: 10.8 10*3/uL — ABNORMAL HIGH (ref 4.0–10.5)
nRBC: 0 % (ref 0.0–0.2)

## 2019-09-05 LAB — SURGICAL PATHOLOGY

## 2019-09-05 NOTE — Progress Notes (Addendum)
Subjective: Postpartum Day 1: Cesarean Delivery Patient reports incisional pain, tolerating PO, + flatus and no problems voiding.    Objective: Vital signs in last 24 hours: Temp:  [98 F (36.7 C)-99 F (37.2 C)] 98.5 F (36.9 C) (04/28 0544) Pulse Rate:  [66-83] 66 (04/28 0544) Resp:  [16-24] 18 (04/28 0544) BP: (119-151)/(77-91) 151/82 (04/28 0544) SpO2:  [96 %-100 %] 96 % (04/28 0544)  Physical Exam:  General: no distress and sleepy but arousable Lochia: appropriate Uterine Fundus: firm Incision: no significant erythema, slight clear drainage present DVT Evaluation: No cords or calf tenderness. No significant calf/ankle edema.  Recent Labs    09/03/19 2058 09/05/19 0437  HGB 12.2 10.4*  HCT 35.7* 31.0*    Assessment/Plan: Status post Cesarean section. Doing well postoperatively.  Continue current care. Hypertensive (151/82) this morning however was mostly normotensive yesterday. Has amlodipine 10mg  scheduled may need to add additional antihypertensive if pressures remain elevated.   Lyndee Hensen, DO 09/05/2019, 7:32 AM  I saw and evaluated the patient. I agree with the findings and the plan of care as documented in the resident's note.  Barrington Ellison, MD [facility] Family Medicine Fellow, [facility] for [employer], [place]

## 2019-09-05 NOTE — Clinical Social Work Maternal (Signed)
CLINICAL SOCIAL WORK MATERNAL/CHILD NOTE  Patient Details  Name: Brianna Hughes MRN: [id number] Date of Birth: 1985-10-20  Date:  09/05/2019  Clinical Social Worker Initiating Note:  Durward Fortes, LCSW Date/Time: Initiated:  09/05/19/1000     Child's Name:  Brianna Hughes   Biological Parents:  Mother(Brianna Hughes)   Need for Interpreter:  None   Reason for Referral:  Current Substance Use/Substance Use During Pregnancy    Address:  [address] [place] [address]    Phone number:  [phone] (home)     Additional phone number: none   Household Members/Support Persons (HM/SP):   Household Member/Support Person 1   HM/SP Name Relationship DOB or Age  HM/SP -[place] MOB  73  HM/SP -[place]  son   7  HM/SP -Media  daughter   31  HM/SP -4        HM/SP -5        HM/SP -6        HM/SP -7        HM/SP -8          Natural Supports (not living in the home):  Parent   Professional Supports: None   Employment: Unemployed   Type of Work: none   Education:  [place] arranged:  n/a  Museum/gallery curator Resources:  Medicaid   Other Resources:  Physicist, medical , [place]   Cultural/Religious Considerations Which May Impact Care:  none reported.   Strengths:  Ability to meet basic needs , Compliance with medical plan , Home prepared for child , Psychotropic Medications   Psychotropic Medications:  Suboxone      Pediatrician:     [place]  Pediatrician List:   [facility]      Pediatrician Fax Number:    Risk Factors/Current Problems:  None   Cognitive State:  Alert , Able to Concentrate , Insightful    Mood/Affect:  Comfortable , Calm , Interested , Happy    CSW Assessment:  CSW consulted as MOB has mental health hx as well as Subutex use during this pregnancy. CSW went to speak with MOB at bedside to address further  needs.   CSW congratulated MOB and FOB on the birth of infant. CSW observed that FOB was preparing to leave the room. CSW introduced role and the reason for CSW coming to visit with her. MOB reported that she was diagnosed with anxiety and depression about 10 years ago. MOB reported that she is currently on Zoloft and Vistaril for her anxiety depression. MOB reported that in 2020 she ws diagnosed with Bipolar which medications currently taken work well for her. MOB reported that she has been feeling fine and denies SI and HI.   MOB reported that she has been on Subutex for almost 5 years. MOB reported that she has been taking this regularly and it is given to her by MD Elta Guadeloupe with [facility]. MOB reports that she speak with him on Tuesday and Saturday to ensure that her needs are being met. MOB reported that she plans to continue taking her Subutex as prescribed to assist wit further needs. MOB  reported that she has all needed items to care for infant at this time. MOB reported that she will have care for infant at [facility].  CSW has reached out to [facility] CPS to give updated of Subutex use during this pregnancy, CPS report made at this time.  CSW was given contact information for [place] [phone] and was advised that she is the pregnancy.  care coordinators for MOB.   CSW made aware by Rico Junker. With [facility] CPS that report wold not be accepted  As MOB was prescribed medication during pregnancy as well as infants UDS is negative. CSW was advised that a [place] referral would be made fo r family to get other service. CSW will continue to monitor infants CDS at this time.   CSW Plan/Description:  No Further Intervention Required/No Barriers to Discharge, Sudden Infant Death Syndrome (SIDS) Education, Perinatal Mood and Anxiety Disorder (PMADs) Education, Neonatal Abstinence Syndrome (NAS) Education, CSW Will Continue to Monitor Umbilical Cord Tissue Drug Screen  Results and Make Report if Warranted, [place], [place] 09/05/2019, 11:19 AM

## 2019-09-06 NOTE — Progress Notes (Signed)
POSTPARTUM PROGRESS NOTE  Subjective: Brianna Hughes is a 34 y.o. [phone] s/p LTCS and BTL at [redacted]w[redacted]d.  She reports she doing well. No acute events overnight. She denies any problems with ambulating, voiding or po intake. Denies nausea or vomiting. She has passed flatus. Pain is well controlled.  Lochia is normal.  Objective: Blood pressure (!) 144/91, pulse 72, temperature 98.4 F (36.9 C), resp. rate 18, height 5\' 6"  (1.676 m), weight 91.6 kg, last menstrual period 12/22/2018, SpO2 97 %, unknown if currently breastfeeding.  Physical Exam:  General: alert, cooperative and no distress Chest: no respiratory distress Abdomen: soft, non-tender  Uterine Fundus: firm, appropriately tender Extremities: No calf swelling or tenderness  no edema  Recent Labs    09/03/19 2058 09/05/19 0437  HGB 12.2 10.4*  HCT 35.7* 31.0*    Assessment/Plan: Brianna Hughes is a 34 y.o. [phone] s/p LTCS and BTL at [redacted]w[redacted]d.  Routine Postpartum Care: Doing well, pain well-controlled.  -- Continue routine care, lactation support  --CHTN: 144/91 this AM with Norvasc 10mg  daily. However, yesterday afternoon after Norvasc given BP normotensive.  -- Contraception: s/p BTL -- Feeding: Bottle, wants to try breast feeding today  Dispo: Plan for discharge 4/30 AM.  Loraine Leriche, DO

## 2019-09-06 NOTE — Plan of Care (Signed)
  Problem: Clinical Measurements: Goal: Ability to maintain clinical measurements within normal limits will improve Outcome: Progressing   Patient progressing well through shift. Pain mgmt per MAR. Patient with no concerns.

## 2019-09-06 NOTE — Discharge Instructions (Signed)
Cesarean Delivery, Care After This sheet gives you information about how to care for yourself after your procedure. Your health care provider may also give you more specific instructions. If you have problems or questions, contact your health care provider. What can I expect after the procedure? After the procedure, it is common to have:  A small amount of blood or clear fluid coming from the incision.  Some redness, swelling, and pain in your incision area.  Some abdominal pain and soreness.  Vaginal bleeding (lochia). Even though you did not have a vaginal delivery, you will still have vaginal bleeding and discharge.  Pelvic cramps.  Fatigue. You may have pain, swelling, and discomfort in the tissue between your vagina and your anus (perineum) if:  Your C-section was unplanned, and you were allowed to labor and push.  An incision was made in the area (episiotomy) or the tissue tore during attempted vaginal delivery. Follow these instructions at home: Incision care   Follow instructions from your health care provider about how to take care of your incision. Make sure you: ? Wash your hands with soap and water before you change your bandage (dressing). If soap and water are not available, use hand sanitizer. ? If you have a dressing, change it or remove it as told by your health care provider. ? Leave stitches (sutures), skin staples, skin glue, or adhesive strips in place. These skin closures may need to stay in place for 2 weeks or longer. If adhesive strip edges start to loosen and curl up, you may trim the loose edges. Do not remove adhesive strips completely unless your health care provider tells you to do that.  Check your incision area every day for signs of infection. Check for: ? More redness, swelling, or pain. ? More fluid or blood. ? Warmth. ? Pus or a bad smell.  Do not take baths, swim, or use a hot tub until your health care provider says it's okay. Ask your health  care provider if you can take showers.  When you cough or sneeze, hug a pillow. This helps with pain and decreases the chance of your incision opening up (dehiscing). Do this until your incision heals. Medicines  Take over-the-counter and prescription medicines only as told by your health care provider.  If you were prescribed an antibiotic medicine, take it as told by your health care provider. Do not stop taking the antibiotic even if you start to feel better.  Do not drive or use heavy machinery while taking prescription pain medicine. Lifestyle  Do not drink alcohol. This is especially important if you are breastfeeding or taking pain medicine.  Do not use any products that contain nicotine or tobacco, such as cigarettes, e-cigarettes, and chewing tobacco. If you need help quitting, ask your health care provider. Eating and drinking  Drink at least 8 eight-ounce glasses of water every day unless told not to by your health care provider. If you breastfeed, you may need to drink even more water.  Eat high-fiber foods every day. These foods may help prevent or relieve constipation. High-fiber foods include: ? Whole grain cereals and breads. ? Brown rice. ? Beans. ? Fresh fruits and vegetables. Activity   If possible, have someone help you care for your baby and help with household activities for at least a few days after you leave the hospital.  Return to your normal activities as told by your health care provider. Ask your health care provider what activities are safe for   you.  Rest as much as possible. Try to rest or take a nap while your baby is sleeping.  Do not lift anything that is heavier than 10 lbs (4.5 kg), or the limit that you were told, until your health care provider says that it is safe.  Talk with your health care provider about when you can engage in sexual activity. This may depend on your: ? Risk of infection. ? How fast you heal. ? Comfort and desire to  engage in sexual activity. General instructions  Do not use tampons or douches until your health care provider approves.  Wear loose, comfortable clothing and a supportive and well-fitting bra.  Keep your perineum clean and dry. Wipe from front to back when you use the toilet.  If you pass a blood clot, save it and call your health care provider to discuss. Do not flush blood clots down the toilet before you get instructions from your health care provider.  Keep all follow-up visits for you and your baby as told by your health care provider. This is important. Contact a health care provider if:  You have: ? A fever. ? Bad-smelling vaginal discharge. ? Pus or a bad smell coming from your incision. ? Difficulty or pain when urinating. ? A sudden increase or decrease in the frequency of your bowel movements. ? More redness, swelling, or pain around your incision. ? More fluid or blood coming from your incision. ? A rash. ? Nausea. ? Little or no interest in activities you used to enjoy. ? Questions about caring for yourself or your baby.  Your incision feels warm to the touch.  Your breasts turn red or become painful or hard.  You feel unusually sad or worried.  You vomit.  You pass a blood clot from your vagina.  You urinate more than usual.  You are dizzy or light-headed. Get help right away if:  You have: ? Pain that does not go away or get better with medicine. ? Chest pain. ? Difficulty breathing. ? Blurred vision or spots in your vision. ? Thoughts about hurting yourself or your baby. ? New pain in your abdomen or in one of your legs. ? A severe headache.  You faint.  You bleed from your vagina so much that you fill more than one sanitary pad in one hour. Bleeding should not be heavier than your heaviest period. Summary  After the procedure, it is common to have pain at your incision site, abdominal cramping, and slight bleeding from your vagina.  Check  your incision area every day for signs of infection.  Tell your health care provider about any unusual symptoms.  Keep all follow-up visits for you and your baby as told by your health care provider. This information is not intended to replace advice given to you by your health care provider. Make sure you discuss any questions you have with your health care provider. Document Revised: 11/02/2017 Document Reviewed: 11/02/2017 Elsevier Patient Education  2020 [employer]  

## 2019-09-06 NOTE — Anesthesia Postprocedure Evaluation (Signed)
Anesthesia Post Note  Patient: Brianna Hughes  Procedure(s) Performed: CESAREAN SECTION     Patient location during evaluation: PACU Anesthesia Type: Epidural Level of consciousness: oriented and awake and alert Pain management: pain level controlled Vital Signs Assessment: post-procedure vital signs reviewed and stable Respiratory status: spontaneous breathing, respiratory function stable and patient connected to nasal cannula oxygen Cardiovascular status: blood pressure returned to baseline and stable Postop Assessment: no headache, no backache, no apparent nausea or vomiting and epidural receding Anesthetic complications: no    Last Vitals:  Vitals:   09/05/19 2044 09/06/19 0533  BP: 130/71 (!) 144/91  Pulse: 74 72  Resp: 16 18  Temp: 37.1 C 36.9 C  SpO2: 97% 97%    Last Pain:  Vitals:   09/06/19 0520  TempSrc:   PainSc: 10-Worst pain ever                 Goldman Birchall L Berthe Oley

## 2019-09-07 ENCOUNTER — Encounter: Payer: Medicaid Other | Admitting: Obstetrics & Gynecology

## 2019-09-07 ENCOUNTER — Other Ambulatory Visit: Payer: Medicaid Other

## 2019-09-07 ENCOUNTER — Ambulatory Visit: Payer: Self-pay

## 2019-09-07 MED ORDER — OXYCODONE HCL 5 MG PO TABS: 5.0000 mg | ORAL_TABLET | Freq: Three times a day (TID) | ORAL | 0 refills | Status: AC | PRN

## 2019-09-07 MED ORDER — SERTRALINE HCL 50 MG PO TABS: 50.0000 mg | ORAL_TABLET | Freq: Every morning | ORAL | 0 refills | Status: DC

## 2019-09-07 MED ORDER — NICOTINE 21 MG/24HR TD PT24: 21.0000 mg | MEDICATED_PATCH | Freq: Every day | TRANSDERMAL | 0 refills | Status: DC

## 2019-09-07 MED ORDER — AMLODIPINE BESYLATE 10 MG PO TABS: 10.0000 mg | ORAL_TABLET | Freq: Every day | ORAL | 2 refills | Status: DC

## 2019-09-07 MED ORDER — GABAPENTIN 300 MG PO CAPS
300.0000 mg | ORAL_CAPSULE | Freq: Three times a day (TID) | ORAL | 0 refills | Status: DC
Start: 2019-09-07 — End: 2023-05-29

## 2019-09-07 MED ORDER — IBUPROFEN 800 MG PO TABS: 800.0000 mg | ORAL_TABLET | Freq: Four times a day (QID) | ORAL | 0 refills | Status: DC

## 2019-09-07 MED FILL — AMLODIPINE BESYLATE 10 MG T: 10 | 30 days supply | Qty: 30 | Fill #0

## 2019-09-07 MED FILL — GABAPENTIN 300 MG CAPSULE: 300 | 10 days supply | Qty: 30 | Fill #0

## 2019-09-07 MED FILL — oxyCODONE HCL 5 MG TABS: 5 | 3 days supply | Qty: 9 | Fill #0

## 2019-09-07 MED FILL — NICOTINE 21 MG/24HR PATCH: 21 | 28 days supply | Qty: 28 | Fill #0

## 2019-09-07 MED FILL — IBUPROFEN 800 MG TAB: 800 | 7 days supply | Qty: 30 | Fill #0

## 2019-09-07 MED FILL — SERTRALINE HCL 50 MG TABLET: 50 | 10 days supply | Qty: 10 | Fill #0

## 2019-09-07 NOTE — Lactation Note (Addendum)
This note was copied from a baby's chart. Lactation Consultation Note  Patient Name: Brianna Hughes [id number] Date: 09/07/2019  Mom requested to see lactation. Baby Brianna Brianna Hughes now 36 hours old.  Mom has been exclusively formula feeding and reports she desires to breastfeed and pump.   Mom reports she had bought a pump but got scared because of her medications. Mom has Medela Pump n style DEBP  Infant in crib with pacifier.  He is fussy.  Mom reports she just tried to breastfeed him but he would not latch.   Assisted mom with pumping with hospital mult iuser pump.Mom has her pump with her.  Discussed how our pump kits were sterile and hers had not been washed yet..  Initiated pumping with mom.  Infant started to get even more fussy.  Asked mom if we could try again to breastfeed.  She agreed.  Infant very fussy at breast.  Coming off and on. Discussed nipple shield use with mom because he has been primarily bottle feed.  Mom in agreement to try nipple shield. Infant maintained better with nipple shield.  Still came off and on some. Explained to parents he did great never having breastfeed  before.  Urged them not to try any more than 15 minutes at a time to feed him right now at the breast.  Urged mom to pump past bf and feed back all her pumped breastmilk and formula as prescribed.  Mom got about 2 oz with pumping. Urged mom to attempt to breastfeed again on cues.  Let dad bottle feed while mom pumping after breastfeeds or attempted breastfeeds.  Parents in agreement.  Urged to call lactation as needed...   Maternal Data    Feeding Feeding Type: Bottle Fed - Formula  LATCH Score                   Interventions    Lactation Tools Discussed/Used     Consult Status      Sharan Mcenaney Thompson Caul 09/07/2019, 10:09 PM

## 2019-09-08 ENCOUNTER — Ambulatory Visit: Payer: Self-pay

## 2019-09-08 NOTE — Lactation Note (Signed)
This note was copied from a baby's chart. Lactation Consultation Note  Patient Name: Brianna Hughes Date: 09/08/2019  Baby Brianna [place] now 3 days old. Mom reports she is still pumping.  Has not tried to breastfed again she reports. But mom reports she has tried to pump again every few hours.  He is eat, sleep, console but has not been very fussy since he was last night mom reports.  Mom reports  She only got 5 ml when she pumped last time. Explained to mom that she did not start pumping until 3 days and that she is not pumping consistantly that she really needed to pump consistantly to have a good supply.Explained to mom that any breastmilk is good that it is not an all or nothing thing.  Urged her to add massage and hand expression to pumping and pump 8-12 times day. Urged her to call lactation as needed.     Maternal Data    Feeding Feeding Type: Bottle Fed - Formula Nipple Type: Nfant Extra Slow Flow (gold)  LATCH Score                   Interventions    Lactation Tools Discussed/Used     Consult Status      Megumi Treaster Thompson Caul 09/08/2019, 3:44 PM

## 2019-09-08 NOTE — Lactation Note (Signed)
This note was copied from a baby's chart. Lactation Consultation Note  Patient Name: Brianna Hughes [id number] Date: 09/08/2019  mom sleeping.  Will follow up today.   Maternal Data    Feeding Feeding Type: Bottle Fed - Formula Nipple Type: Nfant Extra Slow Flow (gold)  LATCH Score                   Interventions    Lactation Tools Discussed/Used     Consult Status      Lerry Cordrey Thompson Caul 09/08/2019, 12:58 PM

## 2019-09-08 NOTE — Lactation Note (Signed)
This note was copied from a baby's chart. Lactation Consultation Note  Patient Name: Brianna Hughes [id number] Date: 09/08/2019   P3, 93 ETI Eat ,Sleep and Consul infant.  Per mom, infant is latching well at breast for 15 minutes and she offers EBM first and then  formula after latching infant at breast. LC unable assess latch at this time due to mom breastfeeding infant prior LC entering room . Mom is currently pumping 25 mls and plans to continue every 3 hours . Mom knows to call  If she has any questions, concerns or need assistance with lathching  Maternal Data    Feeding   LATCH Score             Interventions    Lactation Tools Discussed/Used     Consult Status      Vicente Serene 09/08/2019, 4:12 AM

## 2019-09-11 ENCOUNTER — Ambulatory Visit (INDEPENDENT_AMBULATORY_CARE_PROVIDER_SITE_OTHER): Payer: Medicaid Other | Admitting: Obstetrics & Gynecology

## 2019-09-11 ENCOUNTER — Encounter: Payer: Self-pay | Admitting: Obstetrics & Gynecology

## 2019-09-11 ENCOUNTER — Other Ambulatory Visit: Payer: Self-pay

## 2019-09-11 VITALS — BP 125/89 | HR 100 | Ht 66.0 in | Wt 195.0 lb

## 2019-09-11 DIAGNOSIS — Z98891 History of uterine scar from previous surgery: Secondary | ICD-10-CM

## 2019-09-11 MED ORDER — OXYCODONE-ACETAMINOPHEN 5-325 MG PO TABS: 1.0000 | ORAL_TABLET | ORAL | 0 refills | Status: DC | PRN

## 2019-09-11 NOTE — Progress Notes (Signed)
  HPI: Patient returns for routine postoperative follow-up having undergone primary C section + salpingectomy  on 09/04/19.  The patient's immediate postoperative recovery has been unremarkable. Since hospital discharge the patient reports normal incisional pain.   Current Outpatient Medications: .  amLODipine (NORVASC) 10 MG tablet, Take 1 tablet (10 mg total) by mouth daily., Disp: 30 tablet, Rfl: 2 .  buprenorphine (SUBUTEX) 8 MG SUBL SL tablet, Place 8 mg under the tongue every 8 (eight) hours. Every 8 hrs., Disp: , Rfl:  .  gabapentin (NEURONTIN) 300 MG capsule, Take 1 capsule (300 mg total) by mouth 3 (three) times daily for 10 days., Disp: 30 capsule, Rfl: 0 .  hydrOXYzine (ATARAX/VISTARIL) 25 MG tablet, Take 25 mg by mouth 3 (three) times daily as needed., Disp: , Rfl:  .  ibuprofen (ADVIL) 800 MG tablet, Take 1 tablet (800 mg total) by mouth every 6 (six) hours., Disp: 30 tablet, Rfl: 0 .  labetalol (NORMODYNE) 100 MG tablet, Take 100 mg by mouth once., Disp: , Rfl:  .  nicotine (NICODERM CQ - DOSED IN MG/24 HOURS) 21 mg/24hr patch, Place 1 patch (21 mg total) onto the skin daily., Disp: 28 patch, Rfl: 0 .  sertraline (ZOLOFT) 50 MG tablet, Take 1 tablet (50 mg total) by mouth every morning., Disp: 10 tablet, Rfl: 0  No current facility-administered medications for this visit.    Blood pressure 125/89, pulse 100, height 5\' 6"  (1.676 m), weight 195 lb (88.5 kg), last menstrual period 12/22/2018, currently breastfeeding.  Physical Exam: Incision clean dry intact Abdomen soft normal post op  Diagnostic Tests:   Pathology:   Impression: S/p primary C section  Plan: Meds ordered this encounter  Medications  . oxyCODONE-acetaminophen (PERCOCET/ROXICET) 5-325 MG tablet    Sig: Take 1 tablet by mouth every 4 (four) hours as needed for severe pain.    Dispense:  20 tablet    Refill:  0     Follow up: 5  weeks  Florian Buff, MD

## 2019-10-09 ENCOUNTER — Ambulatory Visit: Payer: Medicaid Other | Admitting: Women's Health

## 2019-10-18 ENCOUNTER — Ambulatory Visit: Payer: Medicaid Other | Admitting: Women's Health

## 2019-10-19 ENCOUNTER — Ambulatory Visit (INDEPENDENT_AMBULATORY_CARE_PROVIDER_SITE_OTHER): Payer: Medicaid Other | Admitting: Women's Health

## 2019-10-19 ENCOUNTER — Encounter: Payer: Self-pay | Admitting: Women's Health

## 2019-10-19 ENCOUNTER — Other Ambulatory Visit (HOSPITAL_COMMUNITY)
Admission: RE | Admit: 2019-10-19 | Discharge: 2019-10-19 | Disposition: A | Discharge status: Home / Self Care | Payer: Medicaid Other | Source: Ambulatory Visit | Attending: Obstetrics and Gynecology | Admitting: Obstetrics and Gynecology

## 2019-10-19 DIAGNOSIS — Z124 Encounter for screening for malignant neoplasm of cervix: Secondary | ICD-10-CM

## 2019-10-19 DIAGNOSIS — I10 Essential (primary) hypertension: Secondary | ICD-10-CM

## 2019-10-19 DIAGNOSIS — O99892 Other specified diseases and conditions complicating childbirth: Secondary | ICD-10-CM

## 2019-10-19 MED ORDER — AMLODIPINE BESYLATE 5 MG PO TABS: 5.0000 mg | ORAL_TABLET | Freq: Every day | ORAL | 11 refills | Status: DC

## 2019-10-19 NOTE — Progress Notes (Signed)
POSTPARTUM VISIT Patient name: Brianna Hughes MRN [id number]  Date of birth: 12/25/1985 Chief Complaint:   Postpartum Care  History of Present Illness:   Brianna Hughes is a 34 y.o. [phone] Caucasian female being seen today for a postpartum visit. She is 6 weeks postpartum following a primary cesarean section, low transverse incision w/ bilateral salpingectomy at 38.0 gestational weeks d/t fetal bradycardia. Anesthesia: epidural.  I have fully reviewed the prenatal and intrapartum course. Pregnancy complicated by [facility] and subutex. Postpartum course has been complicated by fibromyalgia flare, hurts all over. Taking labetalol for CHTN. Wasn't on meds at all prior to pregnancy. Hasn't taken labetalol today.  Bleeding brown. Bowel function is normal. Bladder function is normal. Goes to Dr. Elta Guadeloupe at [facility] in [place] 2x/wk, currently on subutex 51m QID, getting ready to drop to TID. Was on cymbalta, Dr. MCorlis Hoveswitched her to Zoloft, just increased her to 1024mthe other day. Patient is sexually active.   No LMP recorded. (Menstrual status: Lactating).  Baby's course has been uncomplicated. Baby is feeding by breast & bottle    Edinburgh Postpartum Depression Screening:   Edinburgh Postnatal Depression Scale - 10/19/19 1015      Edinburgh Postnatal Depression Scale:  In the Past 7 Days   I have been able to laugh and see the funny side of things. 0    I have looked forward with enjoyment to things. 0    I have blamed myself unnecessarily when things went wrong. 0    I have been anxious or worried for no good reason. 2    I have felt scared or panicky for no good reason. 1    Things have been getting on top of me. 1    I have been so unhappy that I have had difficulty sleeping. 1    I have felt sad or miserable. 2    I have been so unhappy that I have been crying. 1    The thought of harming myself has occurred to me. 0    Edinburgh Postnatal Depression Scale Total 8          Review  of Systems:   Pertinent items are noted in HPI Denies Abnormal vaginal discharge w/ itching/odor/irritation, headaches, visual changes, shortness of breath, chest pain, abdominal pain, severe nausea/vomiting, or problems with urination or bowel movements. Pertinent History Reviewed:  Reviewed past medical,surgical, obstetrical and family history.  Reviewed problem list, medications and allergies. OB History  Gravida Para Term Preterm AB Living  3 3 2 1  0 3  SAB TAB Ectopic Multiple Live Births  0 0 0 0 3    # Outcome Date GA Lbr Len/2nd Weight Sex Delivery Anes PTL Lv  3 Term 09/04/19 [id number] lb 15 oz (2.24 kg) M CS-LTranv EPI  LIV  2 Term 10/13/11 [id number] / 00:23 5 lb 14.8 oz (2.688 kg) M Vag-Spont EPI N LIV  1 Preterm 01/06/09 [id number] 11 oz (2.126 kg) F Vag-Spont EPI Y LIV     Complications: Preterm premature rupture of membranes (PPROM) delivered, current hospitalization   Physical Assessment:   Vitals:   10/19/19 1012  BP: (!) 148/96  Pulse: 65  Weight: 202 lb 6.4 oz (91.8 kg)  Height: 5' 5"  (1.651 m)  Body mass index is 33.68 kg/m.       Physical Examination:   General appearance: alert, well appearing, and in no distress  Mental status: alert, oriented to person,  place, and time  Skin: warm & dry   Cardiovascular: normal heart rate noted   Respiratory: normal respiratory effort, no distress   Breasts: deferred, no complaints   Abdomen: soft, non-tender, c/s incision well-healed   Pelvic: VULVA: normal appearing vulva with no masses, tenderness or lesions, VAGINA: normal appearing vagina with normal color and discharge, no lesions, CERVIX: normal appearing cervix without discharge or lesions, thin prep pap obtained  Rectal: no hemorrhoids  Extremities: no edema       No results found for this or any previous visit (from the past 24 hour(s)).  Assessment & Plan:  1) Postpartum exam 2) 6 wks s/p PLTCS 3) Breast & bottlefeeding 4) Depression screening 5)  Contraception counseling 6) CHTN> stop labetalol, switch to norvasc 73m, come back next week for bp check w/ nurse, take meds at least 1hr prior  Essential components of care per ACOG recommendations:  1.  Mood and well being:   Patient with negative depression screening today.  Pre-existing mental health disorders? Yes, is currently doing counseling w/ [facility]  Patient does use tobacco. If using tobacco we discussed reduction/cessation and risk of relapse.   Substance use disorder? Yes  H/O opiate addiction, currently well managed on subutex  2. Infant care and feeding:   Patient currently breastfeeding? Yes   Childcare strategy if returning to work/school: not returning to work  Infant has a pediatrician/family doctor? Yes   Recommended that all caregivers be immunized for flu, pertussis and other preventable communicable diseases  Pt does have material needs met such as stable housing, utilities, food and diapers. If not, referred to local resources for help obtaining these.  3. Sexuality, contraception and birth spacing  Provided guidance regarding sexuality, management of dyspareunia, and resumption of intercourse  Patient does not want a pregnancy in the near future, s/p bilateral salpingectomy  4. Sleep and fatigue  Discussed coping options for fatigue and sleep disruption  Encouraged family/partner/community support of 4 hrs of uninterrupted sleep to help with mood and fatigue  5. Physical recovery   Pt did  have a cesarean section. If yes, assessed incisional pain and providing guidance on normal vs prolonged recovery  Patient had a n/a laceration  Patient has urinary incontinence? No, fecal incontinence? No   Patient is safe to resume physical activity. Discussed attainment of healthy weight.  6.  Chronic disease management  Discussed pregnancy complications if any, and their implications for future childbearing and long-term maternal  health.  Review recommendations for prevention of recurrent pregnancy complications, such as 17 hydroxyprogesterone caproate to reduce risk for recurrent PTB not applicable, or aspirin to reduce risk of preeclampsia not applicable.  Pt had GDM: No. If yes, 2hr GTT scheduled: not applicable.  Reviewed medications and non-pregnant dosing including consideration of whether pt is breastfeeding using a reliable resource such as LactMed: yes  Referred for f/u w/ PCP or subspecialist providers as indicated: yes, for fibromyalgia flare  7. Health maintenance  Last pap smear 12/08/16 and results were normal  Mammogram at 34yo or earlier if indicated  Meds:  Meds ordered this encounter  Medications   amLODipine (NORVASC) 5 MG tablet    Sig: Take 1 tablet (5 mg total) by mouth daily.    Dispense:  30 tablet    Refill:  11    Order Specific Question:   Supervising Provider    Answer:   EFlorian Buff[[id number]]    Follow-up: Return for one day next week for  bp check w/ nurse .   No orders of the defined types were placed in this encounter.   [place], [facility] 10/19/2019 11:05 AM

## 2019-10-19 NOTE — Patient Instructions (Addendum)
Take norvasc at least 1hr prior to bp check  Tips To Increase Milk Supply  Lots of water! Enough so that your urine is clear  Plenty of calories, if you're not getting enough calories, your milk supply can decrease  Breastfeed/pump often, every 2-3 hours x 20-49mins  Fenugreek 3 pills 3 times a day, this may make your urine smell like maple syrup  Mother's Milk Tea  Lactation cookies, google for the recipe  Real oatmeal  Body Armor sports drinks

## 2019-10-19 NOTE — Addendum Note (Signed)
Addended by: Linton Rump on: 10/19/2019 11:16 AM   Modules accepted: Orders

## 2019-10-23 LAB — CYTOLOGY - PAP
Adequacy: ABSENT
Comment: NEGATIVE
Diagnosis: NEGATIVE
High risk HPV: NEGATIVE

## 2020-01-24 ENCOUNTER — Encounter (HOSPITAL_COMMUNITY): Payer: Self-pay | Admitting: *Deleted

## 2020-01-24 ENCOUNTER — Other Ambulatory Visit: Payer: Self-pay

## 2020-01-24 DIAGNOSIS — F17210 Nicotine dependence, cigarettes, uncomplicated: Secondary | ICD-10-CM | POA: Diagnosis not present

## 2020-01-24 DIAGNOSIS — R000 Tachycardia, unspecified: Secondary | ICD-10-CM | POA: Diagnosis not present

## 2020-01-24 DIAGNOSIS — R109 Unspecified abdominal pain: Secondary | ICD-10-CM | POA: Diagnosis present

## 2020-01-24 DIAGNOSIS — N12 Tubulo-interstitial nephritis, not specified as acute or chronic: Secondary | ICD-10-CM | POA: Insufficient documentation

## 2020-01-24 DIAGNOSIS — Z79899 Other long term (current) drug therapy: Secondary | ICD-10-CM | POA: Diagnosis not present

## 2020-01-24 DIAGNOSIS — I10 Essential (primary) hypertension: Secondary | ICD-10-CM | POA: Insufficient documentation

## 2020-01-24 MED ORDER — ACETAMINOPHEN 325 MG PO TABS
650.0000 mg | ORAL_TABLET | Freq: Once | ORAL | Status: AC
  Administered 2020-01-24: 650 mg via ORAL
  Filled 2020-01-24: qty 2

## 2020-01-24 NOTE — ED Triage Notes (Signed)
Pt with UTI symptoms- cloudy urine, pain to rt and lt lower back.  Fever noted in triage 101.1  Started 4-5 days ago.

## 2020-01-25 ENCOUNTER — Emergency Department (HOSPITAL_COMMUNITY): Payer: Medicaid Other

## 2020-01-25 ENCOUNTER — Emergency Department (HOSPITAL_COMMUNITY)
Admission: EM | Admit: 2020-01-25 | Discharge: 2020-01-25 | Disposition: A | Discharge status: Home / Self Care | Payer: Medicaid Other | Attending: Emergency Medicine | Admitting: Emergency Medicine

## 2020-01-25 DIAGNOSIS — N12 Tubulo-interstitial nephritis, not specified as acute or chronic: Secondary | ICD-10-CM

## 2020-01-25 LAB — BASIC METABOLIC PANEL
Anion gap: 5 (ref 5–15)
BUN: 11 mg/dL (ref 6–20)
CO2: 25 mmol/L (ref 22–32)
Calcium: 7.8 mg/dL — ABNORMAL LOW (ref 8.9–10.3)
Chloride: 103 mmol/L (ref 98–111)
Creatinine, Ser: 0.75 mg/dL (ref 0.44–1.00)
GFR calc Af Amer: >60 mL/min (ref >60)
GFR calc non Af Amer: >60 mL/min (ref >60)
Glucose, Bld: 103 mg/dL — ABNORMAL HIGH (ref 70–99)
Potassium: 3.3 mmol/L — ABNORMAL LOW (ref 3.5–5.1)
Sodium: 133 mmol/L — ABNORMAL LOW (ref 135–145)

## 2020-01-25 LAB — CBC WITH DIFFERENTIAL/PLATELET
Abs Immature Granulocytes: 0.03 10*3/uL (ref 0.00–0.07)
Basophils Absolute: 0.1 10*3/uL (ref 0.0–0.1)
Basophils Relative: 1 %
Eosinophils Absolute: 0.2 10*3/uL (ref 0.0–0.5)
Eosinophils Relative: 2 %
HCT: 38.4 % (ref 36.0–46.0)
Hemoglobin: 12.6 g/dL (ref 12.0–15.0)
Immature Granulocytes: 0 %
Lymphocytes Relative: 15 %
Lymphs Abs: 1.6 10*3/uL (ref 0.7–4.0)
MCH: 29.4 pg (ref 26.0–34.0)
MCHC: 32.8 g/dL (ref 30.0–36.0)
MCV: 89.5 fL (ref 80.0–100.0)
Monocytes Absolute: 0.9 10*3/uL (ref 0.1–1.0)
Monocytes Relative: 8 %
Neutro Abs: 8.1 10*3/uL — ABNORMAL HIGH (ref 1.7–7.7)
Neutrophils Relative %: 74 %
Platelets: 270 10*3/uL (ref 150–400)
RBC: 4.29 MIL/uL (ref 3.87–5.11)
RDW: 12.6 % (ref 11.5–15.5)
WBC: 10.9 10*3/uL — ABNORMAL HIGH (ref 4.0–10.5)
nRBC: 0 % (ref 0.0–0.2)

## 2020-01-25 LAB — URINALYSIS, ROUTINE W REFLEX MICROSCOPIC
Bilirubin Urine: NEGATIVE
Glucose, UA: NEGATIVE mg/dL
Ketones, ur: NEGATIVE mg/dL
Nitrite: POSITIVE — AB
Protein, ur: NEGATIVE mg/dL
Specific Gravity, Urine: 1.011 (ref 1.005–1.030)
WBC, UA: >50 WBC/hpf — ABNORMAL HIGH (ref 0–5)
pH: 5 (ref 5.0–8.0)

## 2020-01-25 LAB — PREGNANCY, URINE: Preg Test, Ur: NEGATIVE

## 2020-01-25 MED ORDER — CEPHALEXIN 500 MG PO CAPS: 500.0000 mg | ORAL_CAPSULE | Freq: Three times a day (TID) | ORAL | 0 refills | Status: DC

## 2020-01-25 MED ORDER — SODIUM CHLORIDE 0.9 % IV SOLN
1.0000 g | Freq: Once | INTRAVENOUS | Status: AC
  Administered 2020-01-25: 1 g via INTRAVENOUS
  Filled 2020-01-25: qty 10

## 2020-01-25 MED ORDER — SODIUM CHLORIDE 0.9 % IV BOLUS
1000.0000 mL | Freq: Once | INTRAVENOUS | Status: AC
  Administered 2020-01-25: 1000 mL via INTRAVENOUS

## 2020-01-25 MED ORDER — METOCLOPRAMIDE HCL 5 MG/ML IJ SOLN
10.0000 mg | Freq: Once | INTRAMUSCULAR | Status: AC
  Administered 2020-01-25: 10 mg via INTRAVENOUS
  Filled 2020-01-25: qty 2

## 2020-01-25 MED ORDER — DIPHENHYDRAMINE HCL 50 MG/ML IJ SOLN
25.0000 mg | Freq: Once | INTRAMUSCULAR | Status: AC
  Administered 2020-01-25: 25 mg via INTRAVENOUS
  Filled 2020-01-25: qty 1

## 2020-01-25 MED ORDER — PROMETHAZINE HCL 25 MG PO TABS: 25.0000 mg | ORAL_TABLET | Freq: Four times a day (QID) | ORAL | 0 refills | Status: DC | PRN

## 2020-01-25 MED ORDER — KETOROLAC TROMETHAMINE 30 MG/ML IJ SOLN
30.0000 mg | Freq: Once | INTRAMUSCULAR | Status: AC
  Administered 2020-01-25: 30 mg via INTRAVENOUS
  Filled 2020-01-25: qty 1

## 2020-01-25 MED ORDER — IOHEXOL 300 MG/ML  SOLN
100.0000 mL | Freq: Once | INTRAMUSCULAR | Status: AC | PRN
  Administered 2020-01-25: 100 mL via INTRAVENOUS

## 2020-01-25 NOTE — Discharge Instructions (Addendum)
Drink plenty of fluids. Take the medications as prescribed.  Take Aleve 2 tablets orally twice a day for pain and fever, you can also take it with acetaminophen 1000 mg every 6 hours for your fever.  Return to the ED if you have uncontrolled vomiting or pain.
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
## 2020-01-27 LAB — URINE CULTURE: Culture: >=100000 — AB

## 2020-01-30 LAB — CULTURE, BLOOD (ROUTINE X 2)
Culture: NO GROWTH
Culture: NO GROWTH
Special Requests: ADEQUATE
Special Requests: ADEQUATE

## 2020-03-29 ENCOUNTER — Encounter (HOSPITAL_COMMUNITY): Payer: Self-pay | Admitting: Emergency Medicine

## 2020-03-29 ENCOUNTER — Other Ambulatory Visit: Payer: Self-pay

## 2020-03-29 ENCOUNTER — Emergency Department (HOSPITAL_COMMUNITY)
Admission: EM | Admit: 2020-03-29 | Discharge: 2020-03-29 | Disposition: A | Discharge status: Home / Self Care | Payer: Medicaid Other | Attending: Emergency Medicine | Admitting: Emergency Medicine

## 2020-03-29 ENCOUNTER — Emergency Department (HOSPITAL_COMMUNITY): Payer: Medicaid Other

## 2020-03-29 DIAGNOSIS — W01198A Fall on same level from slipping, tripping and stumbling with subsequent striking against other object, initial encounter: Secondary | ICD-10-CM | POA: Insufficient documentation

## 2020-03-29 DIAGNOSIS — R519 Headache, unspecified: Secondary | ICD-10-CM | POA: Insufficient documentation

## 2020-03-29 DIAGNOSIS — Y92013 Bedroom of single-family (private) house as the place of occurrence of the external cause: Secondary | ICD-10-CM | POA: Diagnosis not present

## 2020-03-29 DIAGNOSIS — I10 Essential (primary) hypertension: Secondary | ICD-10-CM | POA: Insufficient documentation

## 2020-03-29 DIAGNOSIS — S0181XA Laceration without foreign body of other part of head, initial encounter: Secondary | ICD-10-CM | POA: Insufficient documentation

## 2020-03-29 DIAGNOSIS — Z79899 Other long term (current) drug therapy: Secondary | ICD-10-CM | POA: Insufficient documentation

## 2020-03-29 DIAGNOSIS — F17210 Nicotine dependence, cigarettes, uncomplicated: Secondary | ICD-10-CM | POA: Insufficient documentation

## 2020-03-29 DIAGNOSIS — W19XXXA Unspecified fall, initial encounter: Secondary | ICD-10-CM

## 2020-03-29 DIAGNOSIS — Y92009 Unspecified place in unspecified non-institutional (private) residence as the place of occurrence of the external cause: Secondary | ICD-10-CM

## 2020-03-29 HISTORY — DX: Bipolar disorder, unspecified: F31.9

## 2020-03-29 MED ORDER — NAPROXEN 500 MG PO TABS: 500.0000 mg | ORAL_TABLET | Freq: Two times a day (BID) | ORAL | 0 refills | Status: DC

## 2020-03-29 MED ORDER — BUPIVACAINE HCL (PF) 0.5 % IJ SOLN
10.0000 mL | Freq: Once | INTRAMUSCULAR | Status: AC
  Administered 2020-03-29: 10 mL
  Filled 2020-03-29: qty 30
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
## 2020-03-29 NOTE — ED Provider Notes (Signed)
Change of shift,  Patient care signed out to me, CT scan unremarkable, Dr. Arnoldo Morale with general surgery has seen the patient and does not believe the patient needs to have this opened up or repaired, this can be kept under a compression dressing, ice and rest, patient informed of the same, stable for discharge   Noemi Chapel, MD 03/29/20 1019
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
## 2020-03-29 NOTE — ED Triage Notes (Signed)
Pt c/o fall tonight after tripping and fell into bedroom door. Pt has small laceration to left side of forehead.

## 2020-04-02 ENCOUNTER — Encounter (HOSPITAL_COMMUNITY): Payer: Self-pay | Admitting: Emergency Medicine

## 2020-04-02 ENCOUNTER — Emergency Department (HOSPITAL_COMMUNITY)
Admission: EM | Admit: 2020-04-02 | Discharge: 2020-04-02 | Disposition: A | Discharge status: Home / Self Care | Payer: Medicaid Other | Attending: Emergency Medicine | Admitting: Emergency Medicine

## 2020-04-02 ENCOUNTER — Other Ambulatory Visit: Payer: Self-pay

## 2020-04-02 DIAGNOSIS — W19XXXA Unspecified fall, initial encounter: Secondary | ICD-10-CM | POA: Insufficient documentation

## 2020-04-02 DIAGNOSIS — I10 Essential (primary) hypertension: Secondary | ICD-10-CM | POA: Diagnosis not present

## 2020-04-02 DIAGNOSIS — S0592XA Unspecified injury of left eye and orbit, initial encounter: Secondary | ICD-10-CM | POA: Diagnosis present

## 2020-04-02 DIAGNOSIS — Z79899 Other long term (current) drug therapy: Secondary | ICD-10-CM | POA: Insufficient documentation

## 2020-04-02 DIAGNOSIS — H02846 Edema of left eye, unspecified eyelid: Secondary | ICD-10-CM | POA: Diagnosis not present

## 2020-04-02 DIAGNOSIS — S060X1A Concussion with loss of consciousness of 30 minutes or less, initial encounter: Secondary | ICD-10-CM | POA: Diagnosis not present

## 2020-04-02 DIAGNOSIS — F17210 Nicotine dependence, cigarettes, uncomplicated: Secondary | ICD-10-CM | POA: Insufficient documentation

## 2020-04-02 DIAGNOSIS — R600 Localized edema: Secondary | ICD-10-CM

## 2020-04-02 MED ORDER — KETOROLAC TROMETHAMINE 30 MG/ML IJ SOLN
30.0000 mg | Freq: Once | INTRAMUSCULAR | Status: AC
  Administered 2020-04-02: 30 mg via INTRAMUSCULAR
  Filled 2020-04-02: qty 1

## 2020-04-02 NOTE — ED Triage Notes (Signed)
Pt states she was seen here recently for laceration to left forehead. Since then her eye has been swelling. Pts left eye is swollen shut at this point.
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
## 2020-04-08 ENCOUNTER — Encounter (HOSPITAL_COMMUNITY): Payer: Self-pay | Admitting: Emergency Medicine

## 2020-04-08 ENCOUNTER — Emergency Department (HOSPITAL_COMMUNITY)
Admission: EM | Admit: 2020-04-08 | Discharge: 2020-04-08 | Disposition: A | Discharge status: Home / Self Care | Payer: Medicaid Other | Attending: Emergency Medicine | Admitting: Emergency Medicine

## 2020-04-08 ENCOUNTER — Emergency Department (HOSPITAL_COMMUNITY)
Admission: EM | Admit: 2020-04-08 | Discharge: 2020-04-08 | Disposition: A | Discharge status: Home / Self Care | Payer: Medicaid Other | Source: Home / Self Care | Attending: Emergency Medicine | Admitting: Emergency Medicine

## 2020-04-08 ENCOUNTER — Other Ambulatory Visit: Payer: Self-pay

## 2020-04-08 DIAGNOSIS — L089 Local infection of the skin and subcutaneous tissue, unspecified: Secondary | ICD-10-CM | POA: Insufficient documentation

## 2020-04-08 DIAGNOSIS — S0181XD Laceration without foreign body of other part of head, subsequent encounter: Secondary | ICD-10-CM | POA: Insufficient documentation

## 2020-04-08 DIAGNOSIS — W01198D Fall on same level from slipping, tripping and stumbling with subsequent striking against other object, subsequent encounter: Secondary | ICD-10-CM | POA: Insufficient documentation

## 2020-04-08 DIAGNOSIS — I10 Essential (primary) hypertension: Secondary | ICD-10-CM | POA: Diagnosis not present

## 2020-04-08 DIAGNOSIS — Z79899 Other long term (current) drug therapy: Secondary | ICD-10-CM | POA: Insufficient documentation

## 2020-04-08 DIAGNOSIS — F17210 Nicotine dependence, cigarettes, uncomplicated: Secondary | ICD-10-CM | POA: Insufficient documentation

## 2020-04-08 DIAGNOSIS — Z5321 Procedure and treatment not carried out due to patient leaving prior to being seen by health care provider: Secondary | ICD-10-CM | POA: Insufficient documentation

## 2020-04-08 DIAGNOSIS — Z4802 Encounter for removal of sutures: Secondary | ICD-10-CM | POA: Diagnosis not present

## 2020-04-08 DIAGNOSIS — T148XXA Other injury of unspecified body region, initial encounter: Secondary | ICD-10-CM

## 2020-04-08 MED ORDER — CEPHALEXIN 500 MG PO CAPS: 500.0000 mg | ORAL_CAPSULE | Freq: Four times a day (QID) | ORAL | 0 refills | Status: AC

## 2020-04-08 MED ORDER — LIDOCAINE-EPINEPHRINE (PF) 2 %-1:200000 IJ SOLN: 10.0000 mL | Freq: Once | INTRAMUSCULAR | Status: AC

## 2020-04-08 MED ORDER — LIDOCAINE-EPINEPHRINE (PF) 2 %-1:200000 IJ SOLN
INTRAMUSCULAR | Status: AC
  Administered 2020-04-08: 10 mL
  Filled 2020-04-08: qty 20

## 2020-04-08 MED ORDER — CEPHALEXIN 500 MG PO CAPS
500.0000 mg | ORAL_CAPSULE | Freq: Once | ORAL | Status: AC
  Administered 2020-04-08: 500 mg via ORAL
  Filled 2020-04-08: qty 1
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
## 2020-04-08 NOTE — ED Triage Notes (Signed)
Pt is here for suture removal

## 2020-04-08 NOTE — Discharge Instructions (Addendum)
Like we discussed, please pick up your prescription for Keflex. This is the same antibiotic that you were given in the emergency department. You are going to take this 4 times a day for the next 5 days. Do not stop taking it early even if you feel that your symptoms have resolved.  Please have your new stitches removed in 1 week. If you develop any signs or symptoms of infection once again, you need to return to the emergency department immediately for reevaluation.

## 2020-04-18 ENCOUNTER — Encounter: Payer: Self-pay | Admitting: General Practice

## 2020-09-04 ENCOUNTER — Emergency Department (HOSPITAL_COMMUNITY): Payer: Medicaid Other

## 2020-09-04 ENCOUNTER — Other Ambulatory Visit: Payer: Self-pay

## 2020-09-04 ENCOUNTER — Inpatient Hospital Stay (HOSPITAL_COMMUNITY)
Admission: EM | Admit: 2020-09-04 | Discharge: 2020-09-07 | DRG: 603 | Disposition: A | Discharge status: Home / Self Care | Payer: Medicaid Other | Attending: Internal Medicine | Admitting: Internal Medicine

## 2020-09-04 DIAGNOSIS — Z79891 Long term (current) use of opiate analgesic: Secondary | ICD-10-CM

## 2020-09-04 DIAGNOSIS — L0390 Cellulitis, unspecified: Secondary | ICD-10-CM | POA: Diagnosis present

## 2020-09-04 DIAGNOSIS — F17210 Nicotine dependence, cigarettes, uncomplicated: Secondary | ICD-10-CM | POA: Diagnosis present

## 2020-09-04 DIAGNOSIS — I10 Essential (primary) hypertension: Secondary | ICD-10-CM | POA: Diagnosis present

## 2020-09-04 DIAGNOSIS — Z820 Family history of epilepsy and other diseases of the nervous system: Secondary | ICD-10-CM

## 2020-09-04 DIAGNOSIS — Z833 Family history of diabetes mellitus: Secondary | ICD-10-CM

## 2020-09-04 DIAGNOSIS — L0201 Cutaneous abscess of face: Principal | ICD-10-CM

## 2020-09-04 DIAGNOSIS — E876 Hypokalemia: Secondary | ICD-10-CM | POA: Diagnosis present

## 2020-09-04 DIAGNOSIS — M069 Rheumatoid arthritis, unspecified: Secondary | ICD-10-CM | POA: Diagnosis present

## 2020-09-04 DIAGNOSIS — Z888 Allergy status to other drugs, medicaments and biological substances status: Secondary | ICD-10-CM

## 2020-09-04 DIAGNOSIS — Z720 Tobacco use: Secondary | ICD-10-CM | POA: Diagnosis present

## 2020-09-04 DIAGNOSIS — Z28311 Partially vaccinated for covid-19: Secondary | ICD-10-CM

## 2020-09-04 DIAGNOSIS — Z885 Allergy status to narcotic agent status: Secondary | ICD-10-CM

## 2020-09-04 DIAGNOSIS — Z809 Family history of malignant neoplasm, unspecified: Secondary | ICD-10-CM

## 2020-09-04 DIAGNOSIS — M419 Scoliosis, unspecified: Secondary | ICD-10-CM | POA: Diagnosis present

## 2020-09-04 DIAGNOSIS — F419 Anxiety disorder, unspecified: Secondary | ICD-10-CM | POA: Diagnosis present

## 2020-09-04 DIAGNOSIS — Z8249 Family history of ischemic heart disease and other diseases of the circulatory system: Secondary | ICD-10-CM

## 2020-09-04 DIAGNOSIS — Z20822 Contact with and (suspected) exposure to covid-19: Secondary | ICD-10-CM | POA: Diagnosis present

## 2020-09-04 DIAGNOSIS — G8929 Other chronic pain: Secondary | ICD-10-CM | POA: Diagnosis present

## 2020-09-04 DIAGNOSIS — M797 Fibromyalgia: Secondary | ICD-10-CM | POA: Diagnosis present

## 2020-09-04 DIAGNOSIS — L03211 Cellulitis of face: Secondary | ICD-10-CM | POA: Diagnosis present

## 2020-09-04 DIAGNOSIS — W458XXA Other foreign body or object entering through skin, initial encounter: Secondary | ICD-10-CM | POA: Diagnosis present

## 2020-09-04 DIAGNOSIS — Z79899 Other long term (current) drug therapy: Secondary | ICD-10-CM

## 2020-09-04 DIAGNOSIS — L739 Follicular disorder, unspecified: Secondary | ICD-10-CM | POA: Diagnosis present

## 2020-09-04 LAB — BASIC METABOLIC PANEL
Anion gap: 8 (ref 5–15)
BUN: 11 mg/dL (ref 6–20)
CO2: 24 mmol/L (ref 22–32)
Calcium: 8.6 mg/dL — ABNORMAL LOW (ref 8.9–10.3)
Chloride: 104 mmol/L (ref 98–111)
Creatinine, Ser: 0.66 mg/dL (ref 0.44–1.00)
GFR, Estimated: >60 mL/min (ref >60)
Glucose, Bld: 93 mg/dL (ref 70–99)
Potassium: 3.2 mmol/L — ABNORMAL LOW (ref 3.5–5.1)
Sodium: 136 mmol/L (ref 135–145)

## 2020-09-04 LAB — CBC WITH DIFFERENTIAL/PLATELET
Abs Immature Granulocytes: 0.04 10*3/uL (ref 0.00–0.07)
Basophils Absolute: 0 10*3/uL (ref 0.0–0.1)
Basophils Relative: 0 %
Eosinophils Absolute: 0.1 10*3/uL (ref 0.0–0.5)
Eosinophils Relative: 1 %
HCT: 39.4 % (ref 36.0–46.0)
Hemoglobin: 13 g/dL (ref 12.0–15.0)
Immature Granulocytes: 0 %
Lymphocytes Relative: 11 %
Lymphs Abs: 1.5 10*3/uL (ref 0.7–4.0)
MCH: 28.9 pg (ref 26.0–34.0)
MCHC: 33 g/dL (ref 30.0–36.0)
MCV: 87.6 fL (ref 80.0–100.0)
Monocytes Absolute: 0.7 10*3/uL (ref 0.1–1.0)
Monocytes Relative: 5 %
Neutro Abs: 11.9 10*3/uL — ABNORMAL HIGH (ref 1.7–7.7)
Neutrophils Relative %: 83 %
Platelets: 264 10*3/uL (ref 150–400)
RBC: 4.5 MIL/uL (ref 3.87–5.11)
RDW: 13.1 % (ref 11.5–15.5)
WBC: 14.2 10*3/uL — ABNORMAL HIGH (ref 4.0–10.5)
nRBC: 0 % (ref 0.0–0.2)

## 2020-09-04 LAB — HCG, QUANTITATIVE, PREGNANCY: hCG, Beta Chain, Quant, S: <1 m[IU]/mL (ref <5)

## 2020-09-04 MED ORDER — MORPHINE SULFATE (PF) 4 MG/ML IV SOLN
4.0000 mg | Freq: Once | INTRAVENOUS | Status: AC
  Administered 2020-09-05: 4 mg via INTRAVENOUS
  Filled 2020-09-04: qty 1

## 2020-09-04 MED ORDER — ONDANSETRON HCL 4 MG/2ML IJ SOLN
4.0000 mg | Freq: Once | INTRAMUSCULAR | Status: AC
  Administered 2020-09-05: 4 mg via INTRAVENOUS
  Filled 2020-09-04: qty 2

## 2020-09-04 MED ORDER — CLINDAMYCIN PHOSPHATE 600 MG/50ML IV SOLN
600.0000 mg | Freq: Once | INTRAVENOUS | Status: AC
  Administered 2020-09-04: 600 mg via INTRAVENOUS
  Filled 2020-09-04: qty 50

## 2020-09-04 MED ORDER — LIDOCAINE HCL (PF) 2 % IJ SOLN
5.0000 mL | Freq: Once | INTRAMUSCULAR | Status: AC
  Administered 2020-09-04: 5 mL

## 2020-09-04 MED ORDER — AMLODIPINE BESYLATE 5 MG PO TABS: 5.0000 mg | ORAL_TABLET | Freq: Once | ORAL | Status: DC

## 2020-09-04 MED ORDER — IOHEXOL 300 MG/ML  SOLN
75.0000 mL | Freq: Once | INTRAMUSCULAR | Status: AC | PRN
  Administered 2020-09-04: 75 mL via INTRAVENOUS

## 2020-09-04 MED ORDER — POVIDONE-IODINE 10 % EX SOLN
CUTANEOUS | Status: DC | PRN
  Filled 2020-09-04: qty 15
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
## 2020-09-04 NOTE — ED Notes (Signed)
Pt in CT at this time.

## 2020-09-04 NOTE — ED Triage Notes (Signed)
Pt used electric hair removal device, wounds began looking like ingrown hairs, they are red and drainage, concerned that wound on chin is infected.

## 2020-09-05 ENCOUNTER — Encounter (HOSPITAL_COMMUNITY): Payer: Self-pay | Admitting: Family Medicine

## 2020-09-05 DIAGNOSIS — L0201 Cutaneous abscess of face: Secondary | ICD-10-CM | POA: Diagnosis present

## 2020-09-05 DIAGNOSIS — L03211 Cellulitis of face: Secondary | ICD-10-CM | POA: Diagnosis present

## 2020-09-05 DIAGNOSIS — G8929 Other chronic pain: Secondary | ICD-10-CM | POA: Diagnosis present

## 2020-09-05 DIAGNOSIS — Z720 Tobacco use: Secondary | ICD-10-CM

## 2020-09-05 DIAGNOSIS — F17210 Nicotine dependence, cigarettes, uncomplicated: Secondary | ICD-10-CM | POA: Diagnosis present

## 2020-09-05 DIAGNOSIS — E876 Hypokalemia: Secondary | ICD-10-CM | POA: Diagnosis present

## 2020-09-05 DIAGNOSIS — Z820 Family history of epilepsy and other diseases of the nervous system: Secondary | ICD-10-CM | POA: Diagnosis not present

## 2020-09-05 DIAGNOSIS — Z809 Family history of malignant neoplasm, unspecified: Secondary | ICD-10-CM | POA: Diagnosis not present

## 2020-09-05 DIAGNOSIS — F419 Anxiety disorder, unspecified: Secondary | ICD-10-CM | POA: Diagnosis present

## 2020-09-05 DIAGNOSIS — W458XXA Other foreign body or object entering through skin, initial encounter: Secondary | ICD-10-CM | POA: Diagnosis present

## 2020-09-05 DIAGNOSIS — Z888 Allergy status to other drugs, medicaments and biological substances status: Secondary | ICD-10-CM | POA: Diagnosis not present

## 2020-09-05 DIAGNOSIS — M069 Rheumatoid arthritis, unspecified: Secondary | ICD-10-CM | POA: Diagnosis present

## 2020-09-05 DIAGNOSIS — L0390 Cellulitis, unspecified: Secondary | ICD-10-CM | POA: Diagnosis present

## 2020-09-05 DIAGNOSIS — I10 Essential (primary) hypertension: Secondary | ICD-10-CM

## 2020-09-05 DIAGNOSIS — Z885 Allergy status to narcotic agent status: Secondary | ICD-10-CM | POA: Diagnosis not present

## 2020-09-05 DIAGNOSIS — Z79891 Long term (current) use of opiate analgesic: Secondary | ICD-10-CM | POA: Diagnosis not present

## 2020-09-05 DIAGNOSIS — Z79899 Other long term (current) drug therapy: Secondary | ICD-10-CM | POA: Diagnosis not present

## 2020-09-05 DIAGNOSIS — Z833 Family history of diabetes mellitus: Secondary | ICD-10-CM | POA: Diagnosis not present

## 2020-09-05 DIAGNOSIS — Z8249 Family history of ischemic heart disease and other diseases of the circulatory system: Secondary | ICD-10-CM | POA: Diagnosis not present

## 2020-09-05 DIAGNOSIS — Z20822 Contact with and (suspected) exposure to covid-19: Secondary | ICD-10-CM | POA: Diagnosis present

## 2020-09-05 DIAGNOSIS — L739 Follicular disorder, unspecified: Secondary | ICD-10-CM | POA: Diagnosis present

## 2020-09-05 DIAGNOSIS — Z28311 Partially vaccinated for covid-19: Secondary | ICD-10-CM | POA: Diagnosis not present

## 2020-09-05 DIAGNOSIS — M797 Fibromyalgia: Secondary | ICD-10-CM | POA: Diagnosis present

## 2020-09-05 DIAGNOSIS — M419 Scoliosis, unspecified: Secondary | ICD-10-CM | POA: Diagnosis present

## 2020-09-05 LAB — CBC WITH DIFFERENTIAL/PLATELET
Abs Immature Granulocytes: 0.06 10*3/uL (ref 0.00–0.07)
Basophils Absolute: 0 10*3/uL (ref 0.0–0.1)
Basophils Relative: 0 %
Eosinophils Absolute: 0.1 10*3/uL (ref 0.0–0.5)
Eosinophils Relative: 1 %
HCT: 37.2 % (ref 36.0–46.0)
Hemoglobin: 12.5 g/dL (ref 12.0–15.0)
Immature Granulocytes: 1 %
Lymphocytes Relative: 16 %
Lymphs Abs: 2 10*3/uL (ref 0.7–4.0)
MCH: 29.6 pg (ref 26.0–34.0)
MCHC: 33.6 g/dL (ref 30.0–36.0)
MCV: 88.2 fL (ref 80.0–100.0)
Monocytes Absolute: 0.8 10*3/uL (ref 0.1–1.0)
Monocytes Relative: 6 %
Neutro Abs: 9.5 10*3/uL — ABNORMAL HIGH (ref 1.7–7.7)
Neutrophils Relative %: 76 %
Platelets: 248 10*3/uL (ref 150–400)
RBC: 4.22 MIL/uL (ref 3.87–5.11)
RDW: 13.1 % (ref 11.5–15.5)
WBC: 12.5 10*3/uL — ABNORMAL HIGH (ref 4.0–10.5)
nRBC: 0 % (ref 0.0–0.2)

## 2020-09-05 LAB — COMPREHENSIVE METABOLIC PANEL
ALT: 19 U/L (ref 0–44)
AST: 15 U/L (ref 15–41)
Albumin: 3.3 g/dL — ABNORMAL LOW (ref 3.5–5.0)
Alkaline Phosphatase: 92 U/L (ref 38–126)
Anion gap: 7 (ref 5–15)
BUN: 10 mg/dL (ref 6–20)
CO2: 23 mmol/L (ref 22–32)
Calcium: 8.5 mg/dL — ABNORMAL LOW (ref 8.9–10.3)
Chloride: 106 mmol/L (ref 98–111)
Creatinine, Ser: 0.56 mg/dL (ref 0.44–1.00)
GFR, Estimated: >60 mL/min (ref >60)
Glucose, Bld: 92 mg/dL (ref 70–99)
Potassium: 3.4 mmol/L — ABNORMAL LOW (ref 3.5–5.1)
Sodium: 136 mmol/L (ref 135–145)
Total Bilirubin: 0.7 mg/dL (ref 0.3–1.2)
Total Protein: 6.9 g/dL (ref 6.5–8.1)

## 2020-09-05 LAB — MAGNESIUM: Magnesium: 2 mg/dL (ref 1.7–2.4)

## 2020-09-05 LAB — SARS CORONAVIRUS 2 (TAT 6-24 HRS): SARS Coronavirus 2: NEGATIVE

## 2020-09-05 LAB — HIV ANTIBODY (ROUTINE TESTING W REFLEX): HIV Screen 4th Generation wRfx: NONREACTIVE

## 2020-09-05 MED ORDER — CLINDAMYCIN PHOSPHATE 600 MG/50ML IV SOLN
600.0000 mg | Freq: Three times a day (TID) | INTRAVENOUS | Status: DC
  Administered 2020-09-05 – 2020-09-07 (×7): 600 mg via INTRAVENOUS
  Filled 2020-09-05 (×8): qty 50

## 2020-09-05 MED ORDER — POTASSIUM CHLORIDE 20 MEQ PO PACK
40.0000 meq | PACK | Freq: Once | ORAL | Status: AC
  Administered 2020-09-05: 40 meq via ORAL
  Filled 2020-09-05: qty 2

## 2020-09-05 MED ORDER — KETOROLAC TROMETHAMINE 30 MG/ML IJ SOLN
30.0000 mg | Freq: Four times a day (QID) | INTRAMUSCULAR | Status: AC
  Administered 2020-09-05 – 2020-09-07 (×8): 30 mg via INTRAVENOUS
  Filled 2020-09-05 (×8): qty 1

## 2020-09-05 MED ORDER — HEPARIN SODIUM (PORCINE) 5000 UNIT/ML IJ SOLN
5000.0000 [IU] | Freq: Three times a day (TID) | INTRAMUSCULAR | Status: DC
  Administered 2020-09-05 – 2020-09-07 (×7): 5000 [IU] via SUBCUTANEOUS
  Filled 2020-09-05 (×7): qty 1

## 2020-09-05 MED ORDER — SERTRALINE HCL 50 MG PO TABS
150.0000 mg | ORAL_TABLET | Freq: Every day | ORAL | Status: DC
  Administered 2020-09-05 – 2020-09-07 (×3): 150 mg via ORAL
  Filled 2020-09-05 (×3): qty 3

## 2020-09-05 MED ORDER — PREGABALIN 75 MG PO CAPS
150.0000 mg | ORAL_CAPSULE | Freq: Two times a day (BID) | ORAL | Status: DC
  Administered 2020-09-05 – 2020-09-07 (×5): 150 mg via ORAL
  Filled 2020-09-05 (×5): qty 2

## 2020-09-05 MED ORDER — ACETAMINOPHEN 325 MG PO TABS
650.0000 mg | ORAL_TABLET | Freq: Four times a day (QID) | ORAL | Status: DC | PRN
  Administered 2020-09-06 – 2020-09-07 (×2): 650 mg via ORAL
  Filled 2020-09-05 (×2): qty 2

## 2020-09-05 MED ORDER — CLONAZEPAM 0.5 MG PO TABS
0.5000 mg | ORAL_TABLET | Freq: Two times a day (BID) | ORAL | Status: DC | PRN
  Administered 2020-09-06: 0.5 mg via ORAL
  Filled 2020-09-05: qty 1

## 2020-09-05 MED ORDER — KETOROLAC TROMETHAMINE 30 MG/ML IJ SOLN
30.0000 mg | Freq: Four times a day (QID) | INTRAMUSCULAR | Status: DC | PRN
  Administered 2020-09-05: 30 mg via INTRAVENOUS
  Filled 2020-09-05: qty 1

## 2020-09-05 MED ORDER — AMLODIPINE BESYLATE 5 MG PO TABS
5.0000 mg | ORAL_TABLET | Freq: Every day | ORAL | Status: DC
  Administered 2020-09-05 – 2020-09-07 (×3): 5 mg via ORAL
  Filled 2020-09-05 (×3): qty 1

## 2020-09-05 MED ORDER — ONDANSETRON HCL 4 MG/2ML IJ SOLN: 4.0000 mg | Freq: Four times a day (QID) | INTRAMUSCULAR | Status: DC | PRN

## 2020-09-05 MED ORDER — ACETAMINOPHEN 650 MG RE SUPP: 650.0000 mg | Freq: Four times a day (QID) | RECTAL | Status: DC | PRN

## 2020-09-05 MED ORDER — ONDANSETRON HCL 4 MG PO TABS: 4.0000 mg | ORAL_TABLET | Freq: Four times a day (QID) | ORAL | Status: DC | PRN

## 2020-09-05 MED ORDER — BUPRENORPHINE HCL-NALOXONE HCL 8-2 MG SL SUBL
1.0000 | SUBLINGUAL_TABLET | Freq: Every day | SUBLINGUAL | Status: DC
  Administered 2020-09-05 – 2020-09-06 (×2): 1 via SUBLINGUAL
  Filled 2020-09-05 (×2): qty 1

## 2020-09-05 MED ORDER — AMPHETAMINE-DEXTROAMPHET ER 30 MG PO CP24
30.0000 mg | ORAL_CAPSULE | Freq: Two times a day (BID) | ORAL | Status: DC
  Administered 2020-09-05 – 2020-09-07 (×5): 30 mg via ORAL
  Filled 2020-09-05 (×5): qty 1

## 2020-09-05 MED ORDER — TOPIRAMATE 25 MG PO TABS
50.0000 mg | ORAL_TABLET | Freq: Two times a day (BID) | ORAL | Status: DC
  Administered 2020-09-05 – 2020-09-07 (×5): 50 mg via ORAL
  Filled 2020-09-05 (×5): qty 2

## 2020-09-05 MED ORDER — SERTRALINE HCL 50 MG PO TABS
100.0000 mg | ORAL_TABLET | Freq: Every morning | ORAL | Status: DC
  Administered 2020-09-05: 100 mg via ORAL
  Filled 2020-09-05: qty 2

## 2020-09-05 MED ORDER — NICOTINE 21 MG/24HR TD PT24
21.0000 mg | MEDICATED_PATCH | Freq: Every day | TRANSDERMAL | Status: DC
  Administered 2020-09-05 – 2020-09-07 (×3): 21 mg via TRANSDERMAL
  Filled 2020-09-05 (×3): qty 1

## 2020-09-05 NOTE — H&P (Signed)
TRH H&P    Patient Demographics:    Brianna Hughes, is a 35 y.o. female  MRN: [id number]  DOB - Jun 17, 1985  Admit Date - 09/04/2020  Referring MD/NP/PA: Alfonzo Feller   Outpatient Primary MD for the patient is Caryl Bis, MD  Patient coming from: Home  Chief complaint- Face rash   HPI:    Brianna Hughes  is a 35 y.o. female, with history of HTN, Anxiety, remote history of polysubstance abuse, and more presents to the ED with chief complaint of face rash.  She reports it started 4 days ago.  She did an exfoliation treatment to her face with a razor prior to the start of this rash.  When it started it appeared as small pimples.  She tried to express fluid from the spots, and when she could not she thought they were ingrown hairs and tried to take out a hair.  There is started growing in size, erythema, and pain.  The pain feels like a burning or pressure.  She reports that it is severe.  She has not had any associated tooth aches or notable bumps on her gums.  She reports that 3 days ago the area on her chin started draining purulent fluid.  She reports that she tried to express more fluid, but was unable to.  Patient has not taken any ibuprofen or Tylenol for pain.  She denies any fevers.  She had associated 1 day of dizziness, but reports that that resolved.  Patient has no other complaints at this time.  Patient currently a smoker and smokes half a pack a day.  She does not drink alcohol.  She reports that she quit meth, cocaine, heroin 5 years ago.  She is on Subutex. Patient is partially vaccinated for COVID with 1 vaccine. Patient is full code.  In the ED Temp 98.8, heart rate 80, respiratory 17, blood pressure 133/86, satting 100% Leukocytosis with a white blood cell count of 14.2, hemoglobin 13 Hypokalemia at 3.2 CT soft tissue neck shows acute cellulitis of left chin with a 3.1 x 1.0 x 2.1 cm abscess, odontogenic  source cannot be ruled out.  Reactive adenopathy present.  Right forehead discrete abscess. Clindamycin 600 mg started, morphine for pain, Zofran for nausea, Betadine wash. Admission requested for IV antibiotics for facial cellulitis    Review of systems:    In addition to the HPI above,  No Fever-chills, No Headache, No changes with Vision or hearing, No problems swallowing food or Liquids, No Chest pain, Cough or Shortness of Breath, No Abdominal pain, No Nausea or Vomiting, bowel movements are regular, No Blood in stool or Urine, No dysuria, No new joints pains-aches,  No new weakness, tingling, numbness in any extremity, No recent weight gain or loss, No polyuria, polydypsia or polyphagia, No significant Mental Stressors.  All other systems reviewed and are negative.    Past History of the following :    Past Medical History:  Diagnosis Date  . Anemia   . Anxiety   . Bipolar 1 disorder ([place])   .  Chronic ankle pain   . Chronic back pain   . Chronic headaches   . Chronic knee pain   . Depression    bipolar  . Fibromyalgia   . Headache(784.0)   . Hypertension   . Rheumatoid arthritis ([place])   . Scoliosis       Past Surgical History:  Procedure Laterality Date  . CESAREAN SECTION  09/04/2019   Procedure: CESAREAN SECTION;  Surgeon: Debbrah Alar, MD;  Location: [facility]S;  Service: Obstetrics;;  . MOUTH SURGERY        Social History:      Social History   Tobacco Use  . Smoking status: Light Tobacco Smoker    Packs/day: 0.25    Years: 15.00    Pack years: 3.75    Types: Cigarettes    Last attempt to quit: 03/10/2016    Years since quitting: 4.4  . Smokeless tobacco: Never Used  Substance Use Topics  . Alcohol use: Not Currently    Comment: very rarely       Family History :     Family History  Problem Relation Age of Onset  . Hypertension Mother   . Hypertension Father   . Diabetes Brother   . Hypertension Brother   . Cancer Paternal  Grandfather        prostate  . Congestive Heart Failure Maternal Grandfather   . Multiple sclerosis Maternal Aunt   . Anesthesia problems Neg Hx   . Hypotension Neg Hx   . Malignant hyperthermia Neg Hx   . Pseudochol deficiency Neg Hx       Home Medications:   Prior to Admission medications   Medication Sig Start Date End Date Taking? Authorizing Provider  amLODipine (NORVASC) 5 MG tablet Take 1 tablet (5 mg total) by mouth daily. 10/19/19   Roma Schanz, CNM  buprenorphine (SUBUTEX) 8 MG SUBL SL tablet Place 8 mg under the tongue every 8 (eight) hours. Every 8 hrs. 03/01/19   [provider]  gabapentin (NEURONTIN) 300 MG capsule Take 1 capsule (300 mg total) by mouth 3 (three) times daily for 10 days. 09/07/19 09/17/19  Cresenzo-Dishmon, Joaquim Lai, CNM  naproxen (NAPROSYN) 500 MG tablet Take 1 tablet (500 mg total) by mouth 2 (two) times daily with a meal. 03/29/20   Noemi Chapel, MD  sertraline (ZOLOFT) 50 MG tablet Take 1 tablet (50 mg total) by mouth every morning. Patient taking differently: Take 100 mg by mouth every morning.  09/07/19   Cresenzo-Dishmon, Joaquim Lai, CNM  promethazine (PHENERGAN) 25 MG tablet Take 1 tablet (25 mg total) by mouth every 6 (six) hours as needed for nausea or vomiting. 01/25/20 04/02/20  Rolland Porter, MD     Allergies:     Allergies  Allergen Reactions  . Hydrocodone Nausea And Vomiting  . Tramadol Other (See Comments)    Seizure   . Adhesive [Tape] Rash    Skin blisters from pressure dressingtape used after c-section     Physical Exam:   Vitals  Blood pressure (!) 127/95, pulse 77, temperature 98.8 F (37.1 C), temperature source Oral, resp. rate 18, height 5\' 5"  (1.651 m), weight 83.1 kg, SpO2 97 %, currently breastfeeding.  1.  General: Patient lying supine in bed no acute distress  2. Psychiatric: Mood and behavior normal for situation, alert and oriented x3, pleasant cooperative with exam  3. Neurologic: No facial droop,  speech and language normal, moves all 4 extremities voluntarily, alert and oriented x3, no focal deficit  on limited exam  4. HEENMT:  Face with swelling of the left lower lip, erythema of the left lower lip, chin, nose, and other erythematous spots from picking, pupils reactive, neck is supple, trachea is midline, mucous membranes are moist  5. Respiratory : Lungs are clear to auscultation bilaterally without wheezes, rhonchi, rales, no increased work of breathing, no accessory muscle use  6. Cardiovascular : Heart rate is normal, rhythm is regular, no murmurs rubs or gallops, no JVD, no peripheral edema  7. Gastrointestinal:  Abdomen is soft, nondistended, nontender to palpation without palpable masses, bowel sounds active  8. Skin:  Erythematous patches over face with induration of the left lower lip/chin, no drainage at this time, no other acute lesions  9.Musculoskeletal:  No acute deformity, No calf tenderness, no peripheral edema, peripheral pulses palpated    Data Review:    CBC Recent Labs  Lab 09/04/20 2300  WBC 14.2*  HGB 13.0  HCT 39.4  PLT 264  MCV 87.6  MCH 28.9  MCHC 33.0  RDW 13.1  LYMPHSABS 1.5  MONOABS 0.7  EOSABS 0.1  BASOSABS 0.0   ------------------------------------------------------------------------------------------------------------------  Results for orders placed or performed during the hospital encounter of 09/04/20 (from the past 48 hour(s))  CBC with Differential/Platelet     Status: Abnormal   Collection Time: 09/04/20 11:00 PM  Result Value Ref Range   WBC 14.2 (H) 4.0 - 10.5 K/uL   RBC 4.50 3.87 - 5.11 MIL/uL   Hemoglobin 13.0 12.0 - 15.0 g/dL   HCT 39.4 36.0 - 46.0 %   MCV 87.6 80.0 - 100.0 fL   MCH 28.9 26.0 - 34.0 pg   MCHC 33.0 30.0 - 36.0 g/dL   RDW 13.1 11.5 - 15.5 %   Platelets 264 150 - 400 K/uL   nRBC 0.0 0.0 - 0.2 %   Neutrophils Relative % 83 %   Neutro Abs 11.9 (H) 1.7 - 7.7 K/uL   Lymphocytes Relative 11 %    Lymphs Abs 1.5 0.7 - 4.0 K/uL   Monocytes Relative 5 %   Monocytes Absolute 0.7 0.1 - 1.0 K/uL   Eosinophils Relative 1 %   Eosinophils Absolute 0.1 0.0 - 0.5 K/uL   Basophils Relative 0 %   Basophils Absolute 0.0 0.0 - 0.1 K/uL   Immature Granulocytes 0 %   Abs Immature Granulocytes 0.04 0.00 - 0.07 K/uL    Comment: Performed at [facility], [address] [place], [place] [address]  Basic metabolic panel     Status: Abnormal   Collection Time: 09/04/20 11:00 PM  Result Value Ref Range   Sodium 136 135 - 145 mmol/L   Potassium 3.2 (L) 3.5 - 5.1 mmol/L   Chloride 104 98 - 111 mmol/L   CO2 24 22 - 32 mmol/L   Glucose, Bld 93 70 - 99 mg/dL    Comment: Glucose reference range applies only to samples taken after fasting for at least 8 hours.   BUN 11 6 - 20 mg/dL   Creatinine, Ser 0.66 0.44 - 1.00 mg/dL   Calcium 8.6 (L) 8.9 - 10.3 mg/dL   GFR, Estimated >60 >60 mL/min    Comment: (NOTE) Calculated using the CKD-EPI Creatinine Equation (2021)    Anion gap 8 5 - 15    Comment: Performed at [facility], [address], [place], [place] [address]  hCG, quantitative, pregnancy     Status: None   Collection Time: 09/04/20 11:00 PM  Result Value Ref Range  hCG, Beta Chain, Quant, S <1 <5 mIU/mL    Comment:          GEST. AGE      CONC.  (mIU/mL)   <=1 WEEK        5 - 50     2 WEEKS       50 - 500     3 WEEKS       100 - 10,000     4 WEEKS     1,000 - 30,000     5 WEEKS     3,500 - 115,000   6-8 WEEKS     12,000 - 270,000    12 WEEKS     15,000 - 220,000        FEMALE AND NON-PREGNANT FEMALE:     LESS THAN 5 mIU/mL Performed at [facility] [address] [place], [place] [address]     Chemistries  Recent Labs  Lab 09/04/20 2300  NA 136  K 3.2*  CL 104  CO2 24  GLUCOSE 93  BUN 11  CREATININE 0.66  CALCIUM 8.6*    ------------------------------------------------------------------------------------------------------------------  ------------------------------------------------------------------------------------------------------------------ GFR: Estimated Creatinine Clearance: 106.4 mL/min (by C-G formula based on SCr of 0.66 mg/dL). Liver Function Tests: No results for input(s): AST, ALT, ALKPHOS, BILITOT, PROT, ALBUMIN in the last 168 hours. No results for input(s): LIPASE, AMYLASE in the last 168 hours. No results for input(s): AMMONIA in the last 168 hours. Coagulation Profile: No results for input(s): INR, PROTIME in the last 168 hours. Cardiac Enzymes: No results for input(s): CKTOTAL, CKMB, CKMBINDEX, TROPONINI in the last 168 hours. BNP (last 3 results) No results for input(s): PROBNP in the last 8760 hours. HbA1C: No results for input(s): HGBA1C in the last 72 hours. CBG: No results for input(s): GLUCAP in the last 168 hours. Lipid Profile: No results for input(s): CHOL, HDL, LDLCALC, TRIG, CHOLHDL, LDLDIRECT in the last 72 hours. Thyroid Function Tests: No results for input(s): TSH, T4TOTAL, FREET4, T3FREE, THYROIDAB in the last 72 hours. Anemia Panel: No results for input(s): VITAMINB12, FOLATE, FERRITIN, TIBC, IRON, RETICCTPCT in the last 72 hours.  --------------------------------------------------------------------------------------------------------------- Urine analysis:    Component Value Date/Time   COLORURINE YELLOW 01/24/2020 2124   APPEARANCEUR CLEAR 01/24/2020 2124   APPEARANCEUR Clear 03/07/2019 0000   LABSPEC 1.011 01/24/2020 2124   PHURINE 5.0 01/24/2020 2124   GLUCOSEU NEGATIVE 01/24/2020 2124   HGBUR MODERATE (A) 01/24/2020 2124   BILIRUBINUR NEGATIVE 01/24/2020 2124   BILIRUBINUR Negative 03/07/2019 0000   KETONESUR NEGATIVE 01/24/2020 2124   PROTEINUR NEGATIVE 01/24/2020 2124   UROBILINOGEN 0.2 03/02/2015 1614   NITRITE POSITIVE (A) 01/24/2020 2124    LEUKOCYTESUR MODERATE (A) 01/24/2020 2124      Imaging Results:    CT Soft Tissue Neck W Contrast  Result Date: 09/05/2020 CLINICAL DATA:  Initial evaluation for acute cellulitis EXAM: CT NECK WITH CONTRAST TECHNIQUE: Multidetector CT imaging of the neck was performed using the standard protocol following the bolus administration of intravenous contrast. CONTRAST:  91mL OMNIPAQUE IOHEXOL 300 MG/ML  SOLN COMPARISON:  None available. FINDINGS: Pharynx and larynx: Oral cavity within normal limits. Palatine tonsils symmetric and within normal limits. Few punctate calcified tonsilliths noted bilaterally. Parapharyngeal fat maintained. Nasopharynx and oropharynx normal. No retropharyngeal collection. Epiglottis within normal limits. Remainder of the hypopharynx and supraglottic larynx within normal limits. Glottis closed and not well assessed, but grossly within normal limits. Subglottic airway patent clear. Salivary glands: Few subcentimeter nodular densities within the parotid glands  favored to reflect small intraparotid lymph nodes. Salivary glands including the parotid and submandibular glands are otherwise unremarkable. Thyroid: Normal. Lymph nodes: Enlarged left level IB node measures 1.1 cm. Few additional prominent subcentimeter submental and right level Roman numeral 2 B nodes noted as well. Mildly enlarged left level II nodes measure up to 1.1 cm. Asymmetric prominence of additional subcentimeter left-sided lymph nodes. Findings presumably reactive. Vascular: Normal intravascular enhancement seen throughout the face. Limited intracranial: Unremarkable. Visualized orbits: Normal. No evidence for postseptal or intraorbital cellulitis. Mastoids and visualized paranasal sinuses: Chronic mucosal thickening noted within the ethmoidal air cells and left maxillary sinus. Small retention cyst noted within the inferior right maxillary sinus. Mastoid air cells and middle ear cavities are well pneumatized and free  of fluid. Skeleton: No acute osseous finding. No discrete or worrisome osseous lesions. Poor dentition with multiple scattered dental caries noted. Upper chest: Visualized upper chest demonstrates no acute finding. Partially visualized lungs are clear. Other: Prominent soft tissue swelling with inflammatory stranding seen involving the soft tissues of the left chin, just to the left of midline (series 2, image 58). There is somewhat ill-defined hypodensity within this region measuring approximately 3.1 x 1.0 x 2.1 cm, suspicious for phlegmon and/or early abscess (series 2, image 57). Hazy inflammatory stranding extends to involve the left greater than right submandibular space. Mild involvement of the submental region without frank extension into the floor of mouth at this time. Findings concerning for acute infection/cellulitis. While these findings may be related to overlying skin infection, an odontogenic source could also be considered given the underlying poor dentition. Additional mild focal soft tissue swelling noted at the right forehead without discrete abscess or other collection (series 2, image 9). Of IMPRESSION: 1. Prominent soft tissue swelling with inflammatory stranding involving the soft tissues of the left chin, consistent with acute infection/cellulitis. There is somewhat ill-defined hypodensity within this region measuring approximately 3.1 x 1.0 x 2.1 cm, suspicious for phlegmon and/or early abscess. While these findings may be related to overlying skin infection, an odontogenic source could also be considered and/or contributory, as multiple dental caries are seen about the teeth. 2. Mildly enlarged left-sided cervical adenopathy as above, presumably reactive. 3. Additional small focus of soft tissue swelling at the right forehead without discrete abscess or collection. 4. Chronic ethmoidal and maxillary sinusitis. Electronically Signed   By: Jeannine Boga M.D.   On: 09/05/2020 01:04        Assessment & Plan:    Active Problems:   Tobacco abuse   Chronic hypertension   Cellulitis   Hypokalemia   1. Cellulitis 1. Continue clindamycin 2. gen surg consult for possible I and D 2. Hypokalemia 1. Replace and recheck 2. Check mag in the am 3. Tobacco use disorder 1. Extensive counseling on cessation 2. Nicotine patch 4. HTN 1. Continue norvasc 5.    DVT Prophylaxis-   Heparin- SCDs   AM Labs Ordered, also please review Full Orders  Family Communication: No family at bedside  Code Status:  Full  Admission status: Inpatient :The appropriate admission status for this patient is INPATIENT. Inpatient status is judged to be reasonable and necessary in order to provide the required intensity of service to ensure the patient's safety. The patient's presenting symptoms, physical exam findings, and initial radiographic and laboratory data in the context of their chronic comorbidities is felt to place them at high risk for further clinical deterioration. Furthermore, it is not anticipated that the patient will be medically  stable for discharge from the hospital within 2 midnights of admission. The following factors support the admission status of inpatient.     The patient's presenting symptoms include facial rash The worrisome physical exam findings include facial cellulitis and abscess. The initial radiographic and laboratory data are worrisome because of abscess. The chronic co-morbidities include tobacco use disorder, HTN.       * I certify that at the point of admission it is my clinical judgment that the patient will require inpatient hospital care spanning beyond 2 midnights from the point of admission due to high intensity of service, high risk for further deterioration and high frequency of surveillance required.*  Time spent in minutes : Kimberly

## 2020-09-05 NOTE — Progress Notes (Addendum)
PROGRESS NOTE    JOELEEN WORTLEY  VZD:[id number] DOB: Aug 30, 1985 DOA: 09/04/2020 PCP: Caryl Bis, MD    Brief Narrative:  35 year old female admitted to the hospital with facial rash.  She had performed exfoliation treatment to her face with a razor after which she noticed the development of several pustules.  She had worsening swelling, pain around her chin and lower lip.  On arrival to the emergency room she had CT scan that indicated possible abscess.  She was admitted for IV antibiotics   Assessment & Plan:   Active Problems:   Tobacco abuse   Chronic hypertension   Cellulitis   Hypokalemia   Facial cellulitis with possible abscess -Noted on CT imaging that she may have an abscess around her chin -Currently on clindamycin -Continue on anti-inflammatories -Will discuss with ENT, Dr. Benjamine Mola who felt that CT findings reflective more of a phlegmon than drainable abscess.  Recommendations were to continue on IV antibiotics and follow-up with Dr. Benjamine Mola next week.  Hypertension -Continue on Norvasc  Hypokalemia -Replaced  Tobacco use disorder -Continue nicotine patch   DVT prophylaxis: heparin injection 5,000 Units Start: 09/05/20 0600 SCDs Start: 09/05/20 0401  Code Status: full code Family Communication: discussed with patient Disposition Plan: Status is: Inpatient  Remains inpatient appropriate because:IV treatments appropriate due to intensity of illness or inability to take PO   Dispo: The patient is from: Home              Anticipated d/c is to: Home              Patient currently is not medically stable to d/c.   Difficult to place patient No    Consultants:     Procedures:     Antimicrobials:   Clindamycin 4/29>   Subjective: Feels that swelling and pain is mildly improved. No drainage from chin  Objective: Vitals:   09/05/20 0100 09/05/20 0200 09/05/20 0402 09/05/20 1107  BP: 133/86 (!) 127/95 (!) 132/92 (!) 123/93  Pulse: 80 77 71    Resp: 17 18 16    Temp:   97.7 F (36.5 C)   TempSrc:   Oral   SpO2: 100% 97% 99%   Weight:      Height:       No intake or output data in the 24 hours ending 09/05/20 1216 Filed Weights   09/04/20 2016  Weight: 83.1 kg    Examination:  General exam: Appears calm and comfortable  Respiratory system: Clear to auscultation. Respiratory effort normal. Cardiovascular system: S1 & S2 heard, RRR. No JVD, murmurs, rubs, gallops or clicks. No pedal edema. Gastrointestinal system: Abdomen is nondistended, soft and nontender. No organomegaly or masses felt. Normal bowel sounds heard. Central nervous system: Alert and oriented. No focal neurological deficits. Extremities: Symmetric 5 x 5 power. Skin: as below Psychiatry: Judgement and insight appear normal. Mood & affect appropriate.       Data Reviewed: I have personally reviewed following labs and imaging studies  CBC: Recent Labs  Lab 09/04/20 2300 09/05/20 0518  WBC 14.2* 12.5*  NEUTROABS 11.9* 9.5*  HGB 13.0 12.5  HCT 39.4 37.2  MCV 87.6 88.2  PLT 264 295   Basic Metabolic Panel: Recent Labs  Lab 09/04/20 2300 09/05/20 0518  NA 136 136  K 3.2* 3.4*  CL 104 106  CO2 24 23  GLUCOSE 93 92  BUN 11 10  CREATININE 0.66 0.56  CALCIUM 8.6* 8.5*  MG  --  2.0  GFR: Estimated Creatinine Clearance: 106.4 mL/min (by C-G formula based on SCr of 0.56 mg/dL). Liver Function Tests: Recent Labs  Lab 09/05/20 0518  AST 15  ALT 19  ALKPHOS 92  BILITOT 0.7  PROT 6.9  ALBUMIN 3.3*   No results for input(s): LIPASE, AMYLASE in the last 168 hours. No results for input(s): AMMONIA in the last 168 hours. Coagulation Profile: No results for input(s): INR, PROTIME in the last 168 hours. Cardiac Enzymes: No results for input(s): CKTOTAL, CKMB, CKMBINDEX, TROPONINI in the last 168 hours. BNP (last 3 results) No results for input(s): PROBNP in the last 8760 hours. HbA1C: No results for input(s): HGBA1C in the last 72  hours. CBG: No results for input(s): GLUCAP in the last 168 hours. Lipid Profile: No results for input(s): CHOL, HDL, LDLCALC, TRIG, CHOLHDL, LDLDIRECT in the last 72 hours. Thyroid Function Tests: No results for input(s): TSH, T4TOTAL, FREET4, T3FREE, THYROIDAB in the last 72 hours. Anemia Panel: No results for input(s): VITAMINB12, FOLATE, FERRITIN, TIBC, IRON, RETICCTPCT in the last 72 hours. Sepsis Labs: No results for input(s): PROCALCITON, LATICACIDVEN in the last 168 hours.  Recent Results (from the past 240 hour(s))  SARS CORONAVIRUS 2 (TAT 6-24 HRS) Nasopharyngeal Nasopharyngeal Swab     Status: None   Collection Time: 09/05/20  2:12 AM   Specimen: Nasopharyngeal Swab  Result Value Ref Range Status   SARS Coronavirus 2 NEGATIVE NEGATIVE Final    Comment: (NOTE) SARS-CoV-2 target nucleic acids are NOT DETECTED.  The SARS-CoV-2 RNA is generally detectable in upper and lower respiratory specimens during the acute phase of infection. Negative results do not preclude SARS-CoV-2 infection, do not rule out co-infections with other pathogens, and should not be used as the sole basis for treatment or other patient management decisions. Negative results must be combined with clinical observations, patient history, and epidemiological information. The expected result is Negative.  Fact Sheet for Patients: SugarRoll.be  Fact Sheet for Healthcare Providers: https://www.woods-mathews.com/  This test is not yet approved or cleared by the [place] FDA and  has been authorized for detection and/or diagnosis of SARS-CoV-2 by FDA under an Emergency Use Authorization (EUA). This EUA will remain  in effect (meaning this test can be used) for the duration of the COVID-19 declaration under Se ction 564(b)(1) of the Act, 21 U.S.C. section 360bbb-3(b)(1), unless the authorization is terminated or revoked sooner.  Performed at [facility], [place] [address] [place], [place] [address]          Radiology Studies: CT Soft Tissue Neck W Contrast  Result Date: 09/05/2020 CLINICAL DATA:  Initial evaluation for acute cellulitis EXAM: CT NECK WITH CONTRAST TECHNIQUE: Multidetector CT imaging of the neck was performed using the standard protocol following the bolus administration of intravenous contrast. CONTRAST:  32mL OMNIPAQUE IOHEXOL 300 MG/ML  SOLN COMPARISON:  None available. FINDINGS: Pharynx and larynx: Oral cavity within normal limits. Palatine tonsils symmetric and within normal limits. Few punctate calcified tonsilliths noted bilaterally. Parapharyngeal fat maintained. Nasopharynx and oropharynx normal. No retropharyngeal collection. Epiglottis within normal limits. Remainder of the hypopharynx and supraglottic larynx within normal limits. Glottis closed and not well assessed, but grossly within normal limits. Subglottic airway patent clear. Salivary glands: Few subcentimeter nodular densities within the parotid glands favored to reflect small intraparotid lymph nodes. Salivary glands including the parotid and submandibular glands are otherwise unremarkable. Thyroid: Normal. Lymph nodes: Enlarged left level IB node measures 1.1 cm. Few additional prominent subcentimeter submental and right  level Roman numeral 2 B nodes noted as well. Mildly enlarged left level II nodes measure up to 1.1 cm. Asymmetric prominence of additional subcentimeter left-sided lymph nodes. Findings presumably reactive. Vascular: Normal intravascular enhancement seen throughout the face. Limited intracranial: Unremarkable. Visualized orbits: Normal. No evidence for postseptal or intraorbital cellulitis. Mastoids and visualized paranasal sinuses: Chronic mucosal thickening noted within the ethmoidal air cells and left maxillary sinus. Small retention cyst noted within the inferior right maxillary sinus. Mastoid air cells and middle ear cavities are well  pneumatized and free of fluid. Skeleton: No acute osseous finding. No discrete or worrisome osseous lesions. Poor dentition with multiple scattered dental caries noted. Upper chest: Visualized upper chest demonstrates no acute finding. Partially visualized lungs are clear. Other: Prominent soft tissue swelling with inflammatory stranding seen involving the soft tissues of the left chin, just to the left of midline (series 2, image 58). There is somewhat ill-defined hypodensity within this region measuring approximately 3.1 x 1.0 x 2.1 cm, suspicious for phlegmon and/or early abscess (series 2, image 57). Hazy inflammatory stranding extends to involve the left greater than right submandibular space. Mild involvement of the submental region without frank extension into the floor of mouth at this time. Findings concerning for acute infection/cellulitis. While these findings may be related to overlying skin infection, an odontogenic source could also be considered given the underlying poor dentition. Additional mild focal soft tissue swelling noted at the right forehead without discrete abscess or other collection (series 2, image 9). Of IMPRESSION: 1. Prominent soft tissue swelling with inflammatory stranding involving the soft tissues of the left chin, consistent with acute infection/cellulitis. There is somewhat ill-defined hypodensity within this region measuring approximately 3.1 x 1.0 x 2.1 cm, suspicious for phlegmon and/or early abscess. While these findings may be related to overlying skin infection, an odontogenic source could also be considered and/or contributory, as multiple dental caries are seen about the teeth. 2. Mildly enlarged left-sided cervical adenopathy as above, presumably reactive. 3. Additional small focus of soft tissue swelling at the right forehead without discrete abscess or collection. 4. Chronic ethmoidal and maxillary sinusitis. Electronically Signed   By: Jeannine Boga M.D.   On:  09/05/2020 01:04        Scheduled Meds: . amLODipine  5 mg Oral Daily  . buprenorphine-naloxone  1 tablet Sublingual Daily  . heparin  5,000 Units Subcutaneous Q8H  . ketorolac  30 mg Intravenous Q6H  . nicotine  21 mg Transdermal Daily  . sertraline  100 mg Oral q morning   Continuous Infusions: . clindamycin (CLEOCIN) IV 600 mg (09/05/20 0530)     LOS: 0 days    Time spent: 55mins    Kathie Dike, MD [facility]   If 7PM-7AM, please contact night-coverage [email or web address]  09/05/2020, 12:16 PM

## 2020-09-06 MED ORDER — BUPRENORPHINE HCL-NALOXONE HCL 8-2 MG SL SUBL
1.0000 | SUBLINGUAL_TABLET | Freq: Three times a day (TID) | SUBLINGUAL | Status: DC
  Administered 2020-09-06 – 2020-09-07 (×3): 1 via SUBLINGUAL
  Filled 2020-09-06 (×3): qty 1

## 2020-09-06 MED ORDER — MUPIROCIN 2 % EX OINT
TOPICAL_OINTMENT | CUTANEOUS | Status: AC
  Filled 2020-09-06: qty 22

## 2020-09-06 MED ORDER — MUPIROCIN CALCIUM 2 % EX CREA
TOPICAL_CREAM | Freq: Two times a day (BID) | CUTANEOUS | Status: DC
  Filled 2020-09-06: qty 15

## 2020-09-06 MED ORDER — MUPIROCIN 2 % EX OINT: TOPICAL_OINTMENT | Freq: Two times a day (BID) | CUTANEOUS | Status: DC

## 2020-09-06 NOTE — Plan of Care (Signed)
  Problem: Education: Goal: Knowledge of General Education information will improve Description Including pain rating scale, medication(s)/side effects and non-pharmacologic comfort measures Outcome: Progressing   Problem: Health Behavior/Discharge Planning: Goal: Ability to manage health-related needs will improve Outcome: Progressing   

## 2020-09-06 NOTE — Progress Notes (Signed)
PROGRESS NOTE    Brianna Hughes  QIH:[id number] DOB: 01-14-1986 DOA: 09/04/2020 PCP: Caryl Bis, MD    Brief Narrative:  35 year old female admitted to the hospital with facial rash.  She had performed exfoliation treatment to her face with a razor after which she noticed the development of several pustules.  She had worsening swelling, pain around her chin and lower lip.  On arrival to the emergency room she had CT scan that indicated possible abscess.  She was admitted for IV antibiotics   Assessment & Plan:   Active Problems:   Tobacco abuse   Chronic hypertension   Cellulitis   Hypokalemia   Facial abscess   Facial cellulitis with possible abscess -Noted on CT imaging that she may have an abscess around her chin -Currently on clindamycin -Continue on anti-inflammatories -Discussed with ENT, Dr. Benjamine Mola who felt that CT findings reflective more of a phlegmon than drainable abscess.  Recommendations were to continue on IV antibiotics and follow-up with Dr. Benjamine Mola next week.  Hypertension -Continue on Norvasc  Hypokalemia -Replaced  Tobacco use disorder -Continue nicotine patch   DVT prophylaxis: heparin injection 5,000 Units Start: 09/05/20 0600 SCDs Start: 09/05/20 0401  Code Status: full code Family Communication: discussed with patient Disposition Plan: Status is: Inpatient  Remains inpatient appropriate because:IV treatments appropriate due to intensity of illness or inability to take PO   Dispo: The patient is from: Home              Anticipated d/c is to: Home              Patient currently is not medically stable to d/c.   Difficult to place patient No    Consultants:     Procedures:     Antimicrobials:   Clindamycin 4/29>   Subjective: Has noted some discharge into her mouth from small wound that has opened in her mouth, on inner aspect of lower lip. Feels that overall chin swelling is improving  Objective: Vitals:   09/05/20 1433  09/05/20 2113 09/06/20 0632 09/06/20 1416  BP: 121/84 (!) 136/91 124/78 (!) 129/100  Pulse: 76 60 (!) 56 70  Resp: 17 17 18    Temp: 87.5 F (36.6 C) 98.1 F (36.7 C) 97.7 F (36.5 C) 98.2 F (36.8 C)  TempSrc: Oral Oral Oral Oral  SpO2: 98% 98% 97% 95%  Weight:      Height:        Intake/Output Summary (Last 24 hours) at 09/06/2020 1927 Last data filed at 09/06/2020 1300 Gross per 24 hour  Intake 360 ml  Output 2 ml  Net 358 ml   Filed Weights   09/04/20 2016  Weight: 83.1 kg    Examination:  General exam: Appears calm and comfortable  HEENT: overall swelling of chin is improving, small wound in inner mouth, on inner aspect of lower lip that is draining Respiratory system: Clear to auscultation. Respiratory effort normal. Cardiovascular system: S1 & S2 heard, RRR. No JVD, murmurs, rubs, gallops or clicks. No pedal edema. Gastrointestinal system: Abdomen is nondistended, soft and nontender. No organomegaly or masses felt. Normal bowel sounds heard. Central nervous system: Alert and oriented. No focal neurological deficits. Extremities: Symmetric 5 x 5 power. Skin: as below Psychiatry: Judgement and insight appear normal. Mood & affect appropriate.   Data Reviewed: I have personally reviewed following labs and imaging studies  CBC: Recent Labs  Lab 09/04/20 2300 09/05/20 0518  WBC 14.2* 12.5*  NEUTROABS 11.9* 9.5*  HGB  13.0 12.5  HCT 39.4 37.2  MCV 87.6 88.2  PLT 264 283   Basic Metabolic Panel: Recent Labs  Lab 09/04/20 2300 09/05/20 0518  NA 136 136  K 3.2* 3.4*  CL 104 106  CO2 24 23  GLUCOSE 93 92  BUN 11 10  CREATININE 0.66 0.56  CALCIUM 8.6* 8.5*  MG  --  2.0   GFR: Estimated Creatinine Clearance: 106.4 mL/min (by C-G formula based on SCr of 0.56 mg/dL). Liver Function Tests: Recent Labs  Lab 09/05/20 0518  AST 15  ALT 19  ALKPHOS 92  BILITOT 0.7  PROT 6.9  ALBUMIN 3.3*   No results for input(s): LIPASE, AMYLASE in the last 168  hours. No results for input(s): AMMONIA in the last 168 hours. Coagulation Profile: No results for input(s): INR, PROTIME in the last 168 hours. Cardiac Enzymes: No results for input(s): CKTOTAL, CKMB, CKMBINDEX, TROPONINI in the last 168 hours. BNP (last 3 results) No results for input(s): PROBNP in the last 8760 hours. HbA1C: No results for input(s): HGBA1C in the last 72 hours. CBG: No results for input(s): GLUCAP in the last 168 hours. Lipid Profile: No results for input(s): CHOL, HDL, LDLCALC, TRIG, CHOLHDL, LDLDIRECT in the last 72 hours. Thyroid Function Tests: No results for input(s): TSH, T4TOTAL, FREET4, T3FREE, THYROIDAB in the last 72 hours. Anemia Panel: No results for input(s): VITAMINB12, FOLATE, FERRITIN, TIBC, IRON, RETICCTPCT in the last 72 hours. Sepsis Labs: No results for input(s): PROCALCITON, LATICACIDVEN in the last 168 hours.  Recent Results (from the past 240 hour(s))  SARS CORONAVIRUS 2 (TAT 6-24 HRS) Nasopharyngeal Nasopharyngeal Swab     Status: None   Collection Time: 09/05/20  2:12 AM   Specimen: Nasopharyngeal Swab  Result Value Ref Range Status   SARS Coronavirus 2 NEGATIVE NEGATIVE Final    Comment: (NOTE) SARS-CoV-2 target nucleic acids are NOT DETECTED.  The SARS-CoV-2 RNA is generally detectable in upper and lower respiratory specimens during the acute phase of infection. Negative results do not preclude SARS-CoV-2 infection, do not rule out co-infections with other pathogens, and should not be used as the sole basis for treatment or other patient management decisions. Negative results must be combined with clinical observations, patient history, and epidemiological information. The expected result is Negative.  Fact Sheet for Patients: SugarRoll.be  Fact Sheet for Healthcare Providers: https://www.woods-mathews.com/  This test is not yet approved or cleared by the [place] FDA and  has  been authorized for detection and/or diagnosis of SARS-CoV-2 by FDA under an Emergency Use Authorization (EUA). This EUA will remain  in effect (meaning this test can be used) for the duration of the COVID-19 declaration under Se ction 564(b)(1) of the Act, 21 U.S.C. section 360bbb-3(b)(1), unless the authorization is terminated or revoked sooner.  Performed at [facility], [place] [address], [place], [place] [address]          Radiology Studies: CT Soft Tissue Neck W Contrast  Result Date: 09/05/2020 CLINICAL DATA:  Initial evaluation for acute cellulitis EXAM: CT NECK WITH CONTRAST TECHNIQUE: Multidetector CT imaging of the neck was performed using the standard protocol following the bolus administration of intravenous contrast. CONTRAST:  19mL OMNIPAQUE IOHEXOL 300 MG/ML  SOLN COMPARISON:  None available. FINDINGS: Pharynx and larynx: Oral cavity within normal limits. Palatine tonsils symmetric and within normal limits. Few punctate calcified tonsilliths noted bilaterally. Parapharyngeal fat maintained. Nasopharynx and oropharynx normal. No retropharyngeal collection. Epiglottis within normal limits. Remainder of the hypopharynx and  supraglottic larynx within normal limits. Glottis closed and not well assessed, but grossly within normal limits. Subglottic airway patent clear. Salivary glands: Few subcentimeter nodular densities within the parotid glands favored to reflect small intraparotid lymph nodes. Salivary glands including the parotid and submandibular glands are otherwise unremarkable. Thyroid: Normal. Lymph nodes: Enlarged left level IB node measures 1.1 cm. Few additional prominent subcentimeter submental and right level Roman numeral 2 B nodes noted as well. Mildly enlarged left level II nodes measure up to 1.1 cm. Asymmetric prominence of additional subcentimeter left-sided lymph nodes. Findings presumably reactive. Vascular: Normal intravascular enhancement seen throughout the  face. Limited intracranial: Unremarkable. Visualized orbits: Normal. No evidence for postseptal or intraorbital cellulitis. Mastoids and visualized paranasal sinuses: Chronic mucosal thickening noted within the ethmoidal air cells and left maxillary sinus. Small retention cyst noted within the inferior right maxillary sinus. Mastoid air cells and middle ear cavities are well pneumatized and free of fluid. Skeleton: No acute osseous finding. No discrete or worrisome osseous lesions. Poor dentition with multiple scattered dental caries noted. Upper chest: Visualized upper chest demonstrates no acute finding. Partially visualized lungs are clear. Other: Prominent soft tissue swelling with inflammatory stranding seen involving the soft tissues of the left chin, just to the left of midline (series 2, image 58). There is somewhat ill-defined hypodensity within this region measuring approximately 3.1 x 1.0 x 2.1 cm, suspicious for phlegmon and/or early abscess (series 2, image 57). Hazy inflammatory stranding extends to involve the left greater than right submandibular space. Mild involvement of the submental region without frank extension into the floor of mouth at this time. Findings concerning for acute infection/cellulitis. While these findings may be related to overlying skin infection, an odontogenic source could also be considered given the underlying poor dentition. Additional mild focal soft tissue swelling noted at the right forehead without discrete abscess or other collection (series 2, image 9). Of IMPRESSION: 1. Prominent soft tissue swelling with inflammatory stranding involving the soft tissues of the left chin, consistent with acute infection/cellulitis. There is somewhat ill-defined hypodensity within this region measuring approximately 3.1 x 1.0 x 2.1 cm, suspicious for phlegmon and/or early abscess. While these findings may be related to overlying skin infection, an odontogenic source could also be  considered and/or contributory, as multiple dental caries are seen about the teeth. 2. Mildly enlarged left-sided cervical adenopathy as above, presumably reactive. 3. Additional small focus of soft tissue swelling at the right forehead without discrete abscess or collection. 4. Chronic ethmoidal and maxillary sinusitis. Electronically Signed   By: Jeannine Boga M.D.   On: 09/05/2020 01:04        Scheduled Meds: . amLODipine  5 mg Oral Daily  . amphetamine-dextroamphetamine  30 mg Oral BID  . buprenorphine-naloxone  1 tablet Sublingual TID  . heparin  5,000 Units Subcutaneous Q8H  . ketorolac  30 mg Intravenous Q6H  . mupirocin ointment   Nasal BID  . nicotine  21 mg Transdermal Daily  . pregabalin  150 mg Oral BID  . sertraline  150 mg Oral Daily  . topiramate  50 mg Oral BID   Continuous Infusions: . clindamycin (CLEOCIN) IV 600 mg (09/06/20 1418)     LOS: 1 day    Time spent: 33mins    Kathie Dike, MD [facility]   If 7PM-7AM, please contact night-coverage [email or web address]  09/06/2020, 7:27 PM

## 2020-09-07 LAB — CBC
HCT: 41.9 % (ref 36.0–46.0)
Hemoglobin: 13.7 g/dL (ref 12.0–15.0)
MCH: 29 pg (ref 26.0–34.0)
MCHC: 32.7 g/dL (ref 30.0–36.0)
MCV: 88.6 fL (ref 80.0–100.0)
Platelets: 292 10*3/uL (ref 150–400)
RBC: 4.73 MIL/uL (ref 3.87–5.11)
RDW: 12.9 % (ref 11.5–15.5)
WBC: 6.8 10*3/uL (ref 4.0–10.5)
nRBC: 0 % (ref 0.0–0.2)

## 2020-09-07 LAB — BASIC METABOLIC PANEL
Anion gap: 7 (ref 5–15)
BUN: 15 mg/dL (ref 6–20)
CO2: 23 mmol/L (ref 22–32)
Calcium: 8.6 mg/dL — ABNORMAL LOW (ref 8.9–10.3)
Chloride: 108 mmol/L (ref 98–111)
Creatinine, Ser: 0.63 mg/dL (ref 0.44–1.00)
GFR, Estimated: >60 mL/min (ref >60)
Glucose, Bld: 122 mg/dL — ABNORMAL HIGH (ref 70–99)
Potassium: 3.4 mmol/L — ABNORMAL LOW (ref 3.5–5.1)
Sodium: 138 mmol/L (ref 135–145)

## 2020-09-07 MED ORDER — CLINDAMYCIN HCL 150 MG PO CAPS: 300.0000 mg | ORAL_CAPSULE | Freq: Four times a day (QID) | ORAL | 0 refills | Status: AC

## 2020-09-07 MED ORDER — CLINDAMYCIN HCL 150 MG PO CAPS
300.0000 mg | ORAL_CAPSULE | Freq: Four times a day (QID) | ORAL | Status: DC
  Filled 2020-09-07: qty 2

## 2020-09-07 MED ORDER — PHENOL 1.4 % MT LIQD
1.0000 | OROMUCOSAL | Status: DC | PRN
  Administered 2020-09-07 (×2): 1 via OROMUCOSAL
  Filled 2020-09-07: qty 177

## 2020-09-07 NOTE — Progress Notes (Signed)
pt has sore on the inside of her mouth,  lower left and moving more to right ,and states that is worsening from food getting down in it, and is requesting a mouth wash or some type of oral relief. Sent request to on call to advice.

## 2020-09-07 NOTE — Discharge Summary (Signed)
Physician Discharge Summary  ADRAIN KELL [id number] DOB: Aug 26, 1985 DOA: 09/04/2020  PCP: Caryl Bis, MD  Admit date: 09/04/2020 Discharge date: 09/07/2020  Admitted From: home Disposition:  home  Recommendations for Outpatient Follow-up:  1. Follow up with PCP in 1-2 weeks 2. Please obtain BMP/CBC in one week 3. Follow up with Dr. Benjamine Mola later this week for follow up on [place]: Equipment/Devices:  Discharge Condition:stable CODE STATUS:full code Diet recommendation: regular diet  Brief/Interim Summary: 35 year old female admitted to the hospital with facial rash.  She had performed exfoliation treatment to her face with a razor after which she noticed the development of several pustules.  She had worsening swelling, pain around her chin and lower lip.  On arrival to the emergency room she had CT scan that indicated possible abscess.  She was admitted for IV antibiotics  Discharge Diagnoses:  Active Problems:   Tobacco abuse   Chronic hypertension   Cellulitis   Hypokalemia   Facial abscess  Facial cellulitis with possible abscess -Noted on CT imaging that she may have an abscess around her chin -Currently on clindamycin -Continue on anti-inflammatories -discussed with Dr. Conley Simmonds and after reviewing CT and history, there was not felt to be an odontogenic source of infection -Discussed with ENT, Dr. Benjamine Mola who felt that CT findings reflective more of a phlegmon than drainable abscess.  Recommendations were to continue on IV antibiotics and follow-up with Dr. Benjamine Mola next week. -overall swelling and pain has improved. WBC normalized -will discharge on oral clindamycin  Hypertension -Continue on Norvasc  Hypokalemia -Replaced  Tobacco use disorder -Continue nicotine patch  Discharge Instructions  Discharge Instructions    Diet - low sodium heart healthy   Complete by: As directed    Increase activity slowly   Complete by: As directed       Allergies as of 09/07/2020      Reactions   Hydrocodone Nausea And Vomiting   Tramadol Other (See Comments)   Seizure   Adhesive [tape] Rash   Skin blisters from pressure dressingtape used after c-section      Medication List    TAKE these medications   Adderall XR 30 MG 24 hr capsule Generic drug: amphetamine-dextroamphetamine Take 1 capsule by mouth 2 (two) times daily.   amLODipine 5 MG tablet Commonly known as: NORVASC Take 1 tablet (5 mg total) by mouth daily.   buprenorphine 8 MG Subl SL tablet Commonly known as: SUBUTEX Place 8 mg under the tongue every 8 (eight) hours. Every 8 hrs.   clindamycin 150 MG capsule Commonly known as: Cleocin Take 2 capsules (300 mg total) by mouth 4 (four) times daily for 7 days.   clonazePAM 0.5 MG tablet Commonly known as: KLONOPIN Take 0.5 mg by mouth 2 (two) times daily as needed.   gabapentin 300 MG capsule Commonly known as: Neurontin Take 1 capsule (300 mg total) by mouth 3 (three) times daily for 10 days.   naproxen 500 MG tablet Commonly known as: Naprosyn Take 1 tablet (500 mg total) by mouth 2 (two) times daily with a meal.   pregabalin 150 MG capsule Commonly known as: LYRICA Take 1 capsule by mouth 2 (two) times daily.   sertraline 100 MG tablet Commonly known as: ZOLOFT Take 1.5 tablets by mouth daily.   topiramate 50 MG tablet Commonly known as: TOPAMAX Take 50 mg by mouth 2 (two) times daily.       Follow-up Information    Leta Baptist, MD  Follow up.   Specialty: Otolaryngology Why: call for appointment next week Contact information: [address] [place] [address] [phone]              Allergies  Allergen Reactions  . Hydrocodone Nausea And Vomiting  . Tramadol Other (See Comments)    Seizure   . Adhesive [Tape] Rash    Skin blisters from pressure dressingtape used after c-section    Consultations:  ENT, Dr. Benjamine Mola (phone)   Procedures/Studies: CT Soft Tissue Neck W  Contrast  Result Date: 09/05/2020 CLINICAL DATA:  Initial evaluation for acute cellulitis EXAM: CT NECK WITH CONTRAST TECHNIQUE: Multidetector CT imaging of the neck was performed using the standard protocol following the bolus administration of intravenous contrast. CONTRAST:  75mL OMNIPAQUE IOHEXOL 300 MG/ML  SOLN COMPARISON:  None available. FINDINGS: Pharynx and larynx: Oral cavity within normal limits. Palatine tonsils symmetric and within normal limits. Few punctate calcified tonsilliths noted bilaterally. Parapharyngeal fat maintained. Nasopharynx and oropharynx normal. No retropharyngeal collection. Epiglottis within normal limits. Remainder of the hypopharynx and supraglottic larynx within normal limits. Glottis closed and not well assessed, but grossly within normal limits. Subglottic airway patent clear. Salivary glands: Few subcentimeter nodular densities within the parotid glands favored to reflect small intraparotid lymph nodes. Salivary glands including the parotid and submandibular glands are otherwise unremarkable. Thyroid: Normal. Lymph nodes: Enlarged left level IB node measures 1.1 cm. Few additional prominent subcentimeter submental and right level Roman numeral 2 B nodes noted as well. Mildly enlarged left level II nodes measure up to 1.1 cm. Asymmetric prominence of additional subcentimeter left-sided lymph nodes. Findings presumably reactive. Vascular: Normal intravascular enhancement seen throughout the face. Limited intracranial: Unremarkable. Visualized orbits: Normal. No evidence for postseptal or intraorbital cellulitis. Mastoids and visualized paranasal sinuses: Chronic mucosal thickening noted within the ethmoidal air cells and left maxillary sinus. Small retention cyst noted within the inferior right maxillary sinus. Mastoid air cells and middle ear cavities are well pneumatized and free of fluid. Skeleton: No acute osseous finding. No discrete or worrisome osseous lesions. Poor  dentition with multiple scattered dental caries noted. Upper chest: Visualized upper chest demonstrates no acute finding. Partially visualized lungs are clear. Other: Prominent soft tissue swelling with inflammatory stranding seen involving the soft tissues of the left chin, just to the left of midline (series 2, image 58). There is somewhat ill-defined hypodensity within this region measuring approximately 3.1 x 1.0 x 2.1 cm, suspicious for phlegmon and/or early abscess (series 2, image 57). Hazy inflammatory stranding extends to involve the left greater than right submandibular space. Mild involvement of the submental region without frank extension into the floor of mouth at this time. Findings concerning for acute infection/cellulitis. While these findings may be related to overlying skin infection, an odontogenic source could also be considered given the underlying poor dentition. Additional mild focal soft tissue swelling noted at the right forehead without discrete abscess or other collection (series 2, image 9). Of IMPRESSION: 1. Prominent soft tissue swelling with inflammatory stranding involving the soft tissues of the left chin, consistent with acute infection/cellulitis. There is somewhat ill-defined hypodensity within this region measuring approximately 3.1 x 1.0 x 2.1 cm, suspicious for phlegmon and/or early abscess. While these findings may be related to overlying skin infection, an odontogenic source could also be considered and/or contributory, as multiple dental caries are seen about the teeth. 2. Mildly enlarged left-sided cervical adenopathy as above, presumably reactive. 3. Additional small focus of soft tissue swelling  at the right forehead without discrete abscess or collection. 4. Chronic ethmoidal and maxillary sinusitis. Electronically Signed   By: Jeannine Boga M.D.   On: 09/05/2020 01:04       Subjective: She has noted some discharge in the inside of her mouth, feels that  overall swelling is improving  Discharge Exam: Vitals:   09/06/20 2102 09/06/20 2230 09/07/20 0550 09/07/20 1239  BP: (!) 154/101 132/89 114/76 131/89  Pulse: (!) 53 62 (!) 53 66  Resp: 18   18  Temp: 98.5 F (36.9 C)  97.6 F (36.4 C) 98.2 F (36.8 C)  TempSrc: Oral  Oral Oral  SpO2: 100%  97% 99%  Weight:      Height:        General: Pt is alert, awake, not in acute distress HEENT: swelling around chin and left jaw is improving Cardiovascular: RRR, S1/S2 +, no rubs, no gallops Respiratory: CTA bilaterally, no wheezing, no rhonchi Abdominal: Soft, NT, ND, bowel sounds + Extremities: no edema, no cyanosis    The results of significant diagnostics from this hospitalization (including imaging, microbiology, ancillary and laboratory) are listed below for reference.     Microbiology: Recent Results (from the past 240 hour(s))  SARS CORONAVIRUS 2 (TAT 6-24 HRS) Nasopharyngeal Nasopharyngeal Swab     Status: None   Collection Time: 09/05/20  2:12 AM   Specimen: Nasopharyngeal Swab  Result Value Ref Range Status   SARS Coronavirus 2 NEGATIVE NEGATIVE Final    Comment: (NOTE) SARS-CoV-2 target nucleic acids are NOT DETECTED.  The SARS-CoV-2 RNA is generally detectable in upper and lower respiratory specimens during the acute phase of infection. Negative results do not preclude SARS-CoV-2 infection, do not rule out co-infections with other pathogens, and should not be used as the sole basis for treatment or other patient management decisions. Negative results must be combined with clinical observations, patient history, and epidemiological information. The expected result is Negative.  Fact Sheet for Patients: SugarRoll.be  Fact Sheet for Healthcare Providers: https://www.woods-mathews.com/  This test is not yet approved or cleared by the [place] FDA and  has been authorized for detection and/or diagnosis of SARS-CoV-2  by FDA under an Emergency Use Authorization (EUA). This EUA will remain  in effect (meaning this test can be used) for the duration of the COVID-19 declaration under Se ction 564(b)(1) of the Act, 21 U.S.C. section 360bbb-3(b)(1), unless the authorization is terminated or revoked sooner.  Performed at [facility], [place] [address] [place], [place] [address]      Labs: BNP (last 3 results) No results for input(s): BNP in the last 8760 hours. Basic Metabolic Panel: Recent Labs  Lab 09/04/20 2300 09/05/20 0518 09/07/20 0454  NA 136 136 138  K 3.2* 3.4* 3.4*  CL 104 106 108  CO2 24 23 23   GLUCOSE 93 92 122*  BUN 11 10 15   CREATININE 0.66 0.56 0.63  CALCIUM 8.6* 8.5* 8.6*  MG  --  2.0  --    Liver Function Tests: Recent Labs  Lab 09/05/20 0518  AST 15  ALT 19  ALKPHOS 92  BILITOT 0.7  PROT 6.9  ALBUMIN 3.3*   No results for input(s): LIPASE, AMYLASE in the last 168 hours. No results for input(s): AMMONIA in the last 168 hours. CBC: Recent Labs  Lab 09/04/20 2300 09/05/20 0518 09/07/20 0454  WBC 14.2* 12.5* 6.8  NEUTROABS 11.9* 9.5*  --   HGB 13.0 12.5 13.7  HCT 39.4 37.2 41.9  MCV 87.6 88.2 88.6  PLT 264 248 292   Cardiac Enzymes: No results for input(s): CKTOTAL, CKMB, CKMBINDEX, TROPONINI in the last 168 hours. BNP: Invalid input(s): POCBNP CBG: No results for input(s): GLUCAP in the last 168 hours. D-Dimer No results for input(s): DDIMER in the last 72 hours. Hgb A1c No results for input(s): HGBA1C in the last 72 hours. Lipid Profile No results for input(s): CHOL, HDL, LDLCALC, TRIG, CHOLHDL, LDLDIRECT in the last 72 hours. Thyroid function studies No results for input(s): TSH, T4TOTAL, T3FREE, THYROIDAB in the last 72 hours.  Invalid input(s): FREET3 Anemia work up No results for input(s): VITAMINB12, FOLATE, FERRITIN, TIBC, IRON, RETICCTPCT in the last 72 hours. Urinalysis    Component Value Date/Time   COLORURINE YELLOW 01/24/2020  2124   APPEARANCEUR CLEAR 01/24/2020 2124   APPEARANCEUR Clear 03/07/2019 0000   LABSPEC 1.011 01/24/2020 2124   PHURINE 5.0 01/24/2020 2124   GLUCOSEU NEGATIVE 01/24/2020 2124   HGBUR MODERATE (A) 01/24/2020 2124   BILIRUBINUR NEGATIVE 01/24/2020 2124   BILIRUBINUR Negative 03/07/2019 0000   KETONESUR NEGATIVE 01/24/2020 2124   PROTEINUR NEGATIVE 01/24/2020 2124   UROBILINOGEN 0.2 03/02/2015 1614   NITRITE POSITIVE (A) 01/24/2020 2124   LEUKOCYTESUR MODERATE (A) 01/24/2020 2124   Sepsis Labs Invalid input(s): PROCALCITONIN,  WBC,  LACTICIDVEN Microbiology Recent Results (from the past 240 hour(s))  SARS CORONAVIRUS 2 (TAT 6-24 HRS) Nasopharyngeal Nasopharyngeal Swab     Status: None   Collection Time: 09/05/20  2:12 AM   Specimen: Nasopharyngeal Swab  Result Value Ref Range Status   SARS Coronavirus 2 NEGATIVE NEGATIVE Final    Comment: (NOTE) SARS-CoV-2 target nucleic acids are NOT DETECTED.  The SARS-CoV-2 RNA is generally detectable in upper and lower respiratory specimens during the acute phase of infection. Negative results do not preclude SARS-CoV-2 infection, do not rule out co-infections with other pathogens, and should not be used as the sole basis for treatment or other patient management decisions. Negative results must be combined with clinical observations, patient history, and epidemiological information. The expected result is Negative.  Fact Sheet for Patients: SugarRoll.be  Fact Sheet for Healthcare Providers: https://www.woods-mathews.com/  This test is not yet approved or cleared by the [place] FDA and  has been authorized for detection and/or diagnosis of SARS-CoV-2 by FDA under an Emergency Use Authorization (EUA). This EUA will remain  in effect (meaning this test can be used) for the duration of the COVID-19 declaration under Se ction 564(b)(1) of the Act, 21 U.S.C. section 360bbb-3(b)(1), unless the  authorization is terminated or revoked sooner.  Performed at [facility], [place] [address], [place], [place] [address]      Time coordinating discharge: 32mins  SIGNED:   Kathie Dike, MD  [facility] 09/07/2020, 2:05 PM   If 7PM-7AM, please contact night-coverage [email or web address]

## 2020-09-07 NOTE — Plan of Care (Signed)
  Problem: Education: Goal: Knowledge of General Education information will improve Description: Including pain rating scale, medication(s)/side effects and non-pharmacologic comfort measures 09/07/2020 1536 by Santa Lighter, RN Outcome: Adequate for Discharge 09/07/2020 1211 by Santa Lighter, RN Outcome: Progressing   Problem: Health Behavior/Discharge Planning: Goal: Ability to manage health-related needs will improve 09/07/2020 1536 by Santa Lighter, RN Outcome: Adequate for Discharge 09/07/2020 1211 by Santa Lighter, RN Outcome: Progressing   Problem: Clinical Measurements: Goal: Ability to maintain clinical measurements within normal limits will improve 09/07/2020 1536 by Santa Lighter, RN Outcome: Adequate for Discharge 09/07/2020 1211 by Santa Lighter, RN Outcome: Progressing Goal: Will remain free from infection 09/07/2020 1536 by Santa Lighter, RN Outcome: Adequate for Discharge 09/07/2020 1211 by Santa Lighter, RN Outcome: Progressing Goal: Diagnostic test results will improve 09/07/2020 1536 by Santa Lighter, RN Outcome: Adequate for Discharge 09/07/2020 1211 by Santa Lighter, RN Outcome: Progressing Goal: Respiratory complications will improve Outcome: Adequate for Discharge Goal: Cardiovascular complication will be avoided Outcome: Adequate for Discharge   Problem: Activity: Goal: Risk for activity intolerance will decrease Outcome: Adequate for Discharge   Problem: Nutrition: Goal: Adequate nutrition will be maintained Outcome: Adequate for Discharge   Problem: Coping: Goal: Level of anxiety will decrease Outcome: Adequate for Discharge   Problem: Elimination: Goal: Will not experience complications related to bowel motility Outcome: Adequate for Discharge Goal: Will not experience complications related to urinary retention Outcome: Adequate for Discharge   Problem: Pain Managment: Goal: General experience of comfort will improve Outcome: Adequate for  Discharge   Problem: Safety: Goal: Ability to remain free from injury will improve Outcome: Adequate for Discharge   Problem: Skin Integrity: Goal: Risk for impaired skin integrity will decrease Outcome: Adequate for Discharge

## 2020-09-07 NOTE — Progress Notes (Signed)
Nsg Discharge Note  Admit Date:  09/04/2020 Discharge date: 09/07/2020   Brianna Hughes to be D/C'd Home per MD order.  AVS completed.  Copy for chart, and copy for patient signed, and dated. Patient/caregiver able to verbalize understanding. Removed IV-CDI. Reviewed d/c paperwork with patient. Answered all questions. Gave patient medicine that [facility]) dispensed for patient because [facility] was closed today. Walked stable patient and belongings to ED entrance.  Discharge Medication:   Discharge Assessment: Vitals:   09/07/20 0550 09/07/20 1239  BP: 114/76 131/89  Pulse: (!) 53 66  Resp:  18  Temp: 97.6 F (36.4 C) 98.2 F (36.8 C)  SpO2: 97% 99%   Skin clean, dry and intact without evidence of skin break down, no evidence of skin tears noted. IV catheter discontinued intact. Site without signs and symptoms of complications - no redness or edema noted at insertion site, patient denies c/o pain - only slight tenderness at site.  Dressing with slight pressure applied.  D/c Instructions-Education: Discharge instructions given to patient/family with verbalized understanding. D/c education completed with patient/family including follow up instructions, medication list, d/c activities limitations if indicated, with other d/c instructions as indicated by MD - patient able to verbalize understanding, all questions fully answered. Patient instructed to return to ED, call 911, or call MD for any changes in condition.  Patient escorted via [place], and D/C home via private auto.  Santa Lighter, RN 09/07/2020 3:37 PM

## 2020-09-07 NOTE — Plan of Care (Signed)

## 2020-10-14 ENCOUNTER — Telehealth: Payer: Self-pay

## 2020-10-14 NOTE — Telephone Encounter (Signed)
New patient - no per Pearline Cables

## 2020-10-27 ENCOUNTER — Ambulatory Visit: Payer: Medicaid Other | Admitting: Nurse Practitioner
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
## 2021-01-24 ENCOUNTER — Other Ambulatory Visit: Payer: Self-pay | Admitting: Women's Health

## 2021-02-02 ENCOUNTER — Other Ambulatory Visit: Payer: Self-pay | Admitting: Women's Health

## 2021-05-28 ENCOUNTER — Telehealth: Payer: Self-pay

## 2021-05-29 ENCOUNTER — Ambulatory Visit: Payer: Medicaid Other | Admitting: Obstetrics & Gynecology

## 2021-06-02 ENCOUNTER — Ambulatory Visit: Payer: Medicaid Other | Admitting: Adult Health

## 2021-06-11 NOTE — Telephone Encounter (Signed)
Resolved

## 2023-05-29 ENCOUNTER — Emergency Department (HOSPITAL_COMMUNITY)
Admission: EM | Admit: 2023-05-29 | Discharge: 2023-05-29 | Disposition: A | Discharge status: Home / Self Care | Payer: Medicaid Other | Attending: Emergency Medicine | Admitting: Emergency Medicine

## 2023-05-29 ENCOUNTER — Emergency Department (HOSPITAL_COMMUNITY): Payer: Medicaid Other

## 2023-05-29 ENCOUNTER — Other Ambulatory Visit: Payer: Self-pay

## 2023-05-29 ENCOUNTER — Encounter (HOSPITAL_COMMUNITY): Payer: Self-pay | Admitting: *Deleted

## 2023-05-29 DIAGNOSIS — Z79899 Other long term (current) drug therapy: Secondary | ICD-10-CM | POA: Insufficient documentation

## 2023-05-29 DIAGNOSIS — I10 Essential (primary) hypertension: Secondary | ICD-10-CM | POA: Insufficient documentation

## 2023-05-29 DIAGNOSIS — N3090 Cystitis, unspecified without hematuria: Secondary | ICD-10-CM | POA: Diagnosis not present

## 2023-05-29 DIAGNOSIS — Z20822 Contact with and (suspected) exposure to covid-19: Secondary | ICD-10-CM | POA: Insufficient documentation

## 2023-05-29 DIAGNOSIS — E860 Dehydration: Secondary | ICD-10-CM | POA: Diagnosis not present

## 2023-05-29 DIAGNOSIS — R42 Dizziness and giddiness: Secondary | ICD-10-CM | POA: Diagnosis present

## 2023-05-29 LAB — CBC WITH DIFFERENTIAL/PLATELET
Abs Immature Granulocytes: 0.07 10*3/uL (ref 0.00–0.07)
Basophils Absolute: 0.1 10*3/uL (ref 0.0–0.1)
Basophils Relative: 1 %
Eosinophils Absolute: 0.1 10*3/uL (ref 0.0–0.5)
Eosinophils Relative: 1 %
HCT: 47.3 % — ABNORMAL HIGH (ref 36.0–46.0)
Hemoglobin: 16 g/dL — ABNORMAL HIGH (ref 12.0–15.0)
Immature Granulocytes: 1 %
Lymphocytes Relative: 26 %
Lymphs Abs: 2.4 10*3/uL (ref 0.7–4.0)
MCH: 30.2 pg (ref 26.0–34.0)
MCHC: 33.8 g/dL (ref 30.0–36.0)
MCV: 89.4 fL (ref 80.0–100.0)
Monocytes Absolute: 0.5 10*3/uL (ref 0.1–1.0)
Monocytes Relative: 6 %
Neutro Abs: 6 10*3/uL (ref 1.7–7.7)
Neutrophils Relative %: 65 %
Platelets: 421 10*3/uL — ABNORMAL HIGH (ref 150–400)
RBC: 5.29 MIL/uL — ABNORMAL HIGH (ref 3.87–5.11)
RDW: 11.9 % (ref 11.5–15.5)
WBC: 9.2 10*3/uL (ref 4.0–10.5)
nRBC: 0 % (ref 0.0–0.2)

## 2023-05-29 LAB — RESP PANEL BY RT-PCR (RSV, FLU A&B, COVID)  RVPGX2
Influenza A by PCR: NEGATIVE
Influenza B by PCR: NEGATIVE
Resp Syncytial Virus by PCR: NEGATIVE
SARS Coronavirus 2 by RT PCR: NEGATIVE

## 2023-05-29 LAB — COMPREHENSIVE METABOLIC PANEL
ALT: 22 U/L (ref 0–44)
AST: 18 U/L (ref 15–41)
Albumin: 3.9 g/dL (ref 3.5–5.0)
Alkaline Phosphatase: 84 U/L (ref 38–126)
Anion gap: 6 (ref 5–15)
BUN: 11 mg/dL (ref 6–20)
CO2: 23 mmol/L (ref 22–32)
Calcium: 8.7 mg/dL — ABNORMAL LOW (ref 8.9–10.3)
Chloride: 106 mmol/L (ref 98–111)
Creatinine, Ser: 0.84 mg/dL (ref 0.44–1.00)
GFR, Estimated: >60 mL/min (ref >60)
Glucose, Bld: 98 mg/dL (ref 70–99)
Potassium: 3.9 mmol/L (ref 3.5–5.1)
Sodium: 135 mmol/L (ref 135–145)
Total Bilirubin: 0.3 mg/dL (ref 0.0–1.2)
Total Protein: 8.1 g/dL (ref 6.5–8.1)

## 2023-05-29 LAB — MAGNESIUM: Magnesium: 2.2 mg/dL (ref 1.7–2.4)

## 2023-05-29 LAB — URINALYSIS, W/ REFLEX TO CULTURE (INFECTION SUSPECTED)
Bilirubin Urine: NEGATIVE
Glucose, UA: NEGATIVE mg/dL
Hgb urine dipstick: NEGATIVE
Ketones, ur: NEGATIVE mg/dL
Nitrite: NEGATIVE
Protein, ur: NEGATIVE mg/dL
Specific Gravity, Urine: 1.01 (ref 1.005–1.030)
WBC, UA: >50 WBC/hpf (ref 0–5)
pH: 7 (ref 5.0–8.0)

## 2023-05-29 LAB — CBG MONITORING, ED: Glucose-Capillary: 95 mg/dL (ref 70–99)

## 2023-05-29 LAB — CK: Total CK: 71 U/L (ref 38–234)

## 2023-05-29 MED ORDER — LACTATED RINGERS IV BOLUS
1000.0000 mL | Freq: Once | INTRAVENOUS | Status: AC
  Administered 2023-05-29: 1000 mL via INTRAVENOUS

## 2023-05-29 MED ORDER — MECLIZINE HCL 25 MG PO TABS: 25.0000 mg | ORAL_TABLET | Freq: Three times a day (TID) | ORAL | 0 refills | Status: DC | PRN

## 2023-05-29 MED ORDER — PROCHLORPERAZINE EDISYLATE 10 MG/2ML IJ SOLN
10.0000 mg | Freq: Once | INTRAMUSCULAR | Status: AC
  Administered 2023-05-29: 10 mg via INTRAVENOUS
  Filled 2023-05-29: qty 2

## 2023-05-29 MED ORDER — CEPHALEXIN 500 MG PO CAPS: 500.0000 mg | ORAL_CAPSULE | Freq: Four times a day (QID) | ORAL | 0 refills | Status: DC

## 2023-05-29 MED ORDER — DIPHENHYDRAMINE HCL 50 MG/ML IJ SOLN
12.5000 mg | Freq: Once | INTRAMUSCULAR | Status: AC
  Administered 2023-05-29: 12.5 mg via INTRAVENOUS
  Filled 2023-05-29: qty 1

## 2023-05-29 MED ORDER — MECLIZINE HCL 12.5 MG PO TABS
25.0000 mg | ORAL_TABLET | Freq: Once | ORAL | Status: AC
  Administered 2023-05-29: 25 mg via ORAL
  Filled 2023-05-29 (×2): qty 2

## 2023-05-29 MED ORDER — SODIUM CHLORIDE 0.9 % IV SOLN
1.0000 g | Freq: Once | INTRAVENOUS | Status: AC
  Administered 2023-05-29: 1 g via INTRAVENOUS
  Filled 2023-05-29: qty 10

## 2023-05-29 MED ORDER — KETOROLAC TROMETHAMINE 15 MG/ML IJ SOLN
15.0000 mg | Freq: Once | INTRAMUSCULAR | Status: AC
  Administered 2023-05-29: 15 mg via INTRAVENOUS
  Filled 2023-05-29: qty 1

## 2023-05-29 NOTE — ED Provider Notes (Signed)
[place] EMERGENCY DEPARTMENT AT [facility] Provider Note   CSN: [id number] Arrival date & time: 05/29/23  1802     History  Chief Complaint  Patient presents with   Dizziness    Brianna Hughes is a 38 y.o. female.   Dizziness Associated symptoms: headaches and nausea   Patient presents for dizziness over the past 3 days. Medical history includes bipolar disorder, anxiety, depression, fibromyalgia, rheumatoid arthritis, HTN.  Home medications include Klonopin, Depakote, Zoloft, amitriptyline, topiramate, phentermine, gabapentin, adderall, amlodipine.  She was recently seen for low back pain.  She was recently getting and treated for possible UTI with antibiotics.  She took 4/5 days of this.  She stopped it several days ago due to the dizziness.  She does endorse ongoing dysuria.  Also describes burning myalgias.  She states that she has had similar symptoms in the past from her fibromyalgia but this seems different.  She will notice that she has bilateral hand swelling sometimes at night.  She denies any other recent areas of swelling.  Dizziness has been present throughout the day but will wax and wane in severity.  It has seemed to be worse in the evenings.  It does worsen with positional changes.  Currently, she endorses dizziness while at rest.  This does cause her some nausea.  She states that she has been eating normally.  She has not been taking in much fluids.  She thinks she may be dehydrated.     Home Medications Prior to Admission medications   Medication Sig Start Date End Date Taking? Authorizing Provider  acetaminophen (TYLENOL) 500 MG tablet Take 1,000 mg by mouth 2 (two) times daily as needed for moderate pain (pain score 4-6), fever or headache.   Yes [provider]  amitriptyline (ELAVIL) 25 MG tablet Take 25 mg by mouth at bedtime.   Yes [provider]  Aspirin-Caffeine (BC FAST PAIN RELIEF PO) Take 1 packet by mouth 2 (two) times  daily as needed (pain, headache).   Yes [provider]  cephALEXin (KEFLEX) 500 MG capsule Take 1 capsule (500 mg total) by mouth 4 (four) times daily. 05/29/23  Yes Gloris Manchester, MD  clonazePAM (KLONOPIN) 0.5 MG tablet Take 0.5 mg by mouth daily as needed for anxiety. 08/21/20  Yes [provider]  gabapentin (NEURONTIN) 600 MG tablet Take 600 mg by mouth 2 (two) times daily.   Yes [provider]  phentermine (ADIPEX-P) 37.5 MG tablet Take 37.5 mg by mouth daily.   Yes [provider]  sertraline (ZOLOFT) 25 MG tablet Take 25 mg by mouth daily.   Yes [provider]  nitrofurantoin, macrocrystal-monohydrate, (MACROBID) 100 MG capsule Take 100 mg by mouth 2 (two) times daily. Patient not taking: Reported on 05/29/2023    [provider]  promethazine (PHENERGAN) 25 MG tablet Take 1 tablet (25 mg total) by mouth every 6 (six) hours as needed for nausea or vomiting. 01/25/20 04/02/20  Devoria Albe, MD      Allergies    Hydrocodone, Trileptal [oxcarbazepine], Ultram [tramadol], and Adhesive [tape]    Review of Systems   Review of Systems  Gastrointestinal:  Positive for nausea.  Genitourinary:  Positive for dysuria.  Musculoskeletal:  Positive for myalgias.  Neurological:  Positive for dizziness, light-headedness and headaches.  All other systems reviewed and are negative.   Physical Exam Updated Vital Signs BP 116/81   Pulse 66   Temp 98.1 F (36.7 C) (Oral)   Resp  16   Ht 5\' 5"  (1.651 m)   Wt 79.4 kg   LMP 05/28/2023   SpO2 96%   BMI 29.12 kg/m  Physical Exam Vitals and nursing note reviewed.  Constitutional:      General: She is not in acute distress.    Appearance: Normal appearance. She is well-developed. She is not ill-appearing, toxic-appearing or diaphoretic.  HENT:     Head: Normocephalic and atraumatic.     Right Ear: External ear normal.     Left Ear: External ear normal.     Nose: Nose normal.     Mouth/Throat:      Mouth: Mucous membranes are moist.  Eyes:     Extraocular Movements: Extraocular movements intact.     Conjunctiva/sclera: Conjunctivae normal.  Cardiovascular:     Rate and Rhythm: Normal rate and regular rhythm.  Pulmonary:     Effort: Pulmonary effort is normal. No respiratory distress.  Abdominal:     General: There is no distension.     Palpations: Abdomen is soft.     Tenderness: There is no abdominal tenderness. There is no right CVA tenderness or left CVA tenderness.  Musculoskeletal:        General: No swelling or deformity.     Cervical back: Normal range of motion and neck supple.     Right lower leg: No edema.     Left lower leg: No edema.  Skin:    General: Skin is warm and dry.     Coloration: Skin is not jaundiced or pale.  Neurological:     General: No focal deficit present.     Mental Status: She is alert and oriented to person, place, and time.     Cranial Nerves: No cranial nerve deficit.     Sensory: No sensory deficit.     Motor: No weakness.     Coordination: Coordination normal.  Psychiatric:        Mood and Affect: Mood normal.        Behavior: Behavior normal.     ED Results / Procedures / Treatments   Labs (all labs ordered are listed, but only abnormal results are displayed) Labs Reviewed  CBC WITH DIFFERENTIAL/PLATELET - Abnormal; Notable for the following components:      Result Value   RBC 5.29 (*)    Hemoglobin 16.0 (*)    HCT 47.3 (*)    Platelets 421 (*)    All other components within normal limits  COMPREHENSIVE METABOLIC PANEL - Abnormal; Notable for the following components:   Calcium 8.7 (*)    All other components within normal limits  URINALYSIS, W/ REFLEX TO CULTURE (INFECTION SUSPECTED) - Abnormal; Notable for the following components:   Color, Urine STRAW (*)    APPearance HAZY (*)    Leukocytes,Ua SMALL (*)    Bacteria, UA RARE (*)    All other components within normal limits  RESP PANEL BY RT-PCR (RSV, FLU A&B, COVID)   RVPGX2  URINE CULTURE  MAGNESIUM  CK  CBG MONITORING, ED  POC URINE PREG, ED    EKG None  Radiology CT HEAD WO CONTRAST Result Date: 05/29/2023 CLINICAL DATA:  Dizziness for 3 days with generalized weakness. EXAM: CT HEAD WITHOUT CONTRAST TECHNIQUE: Contiguous axial images were obtained from the base of the skull through the vertex without intravenous contrast. RADIATION DOSE REDUCTION: This exam was performed according to the departmental dose-optimization program which includes automated exposure control, adjustment of the mA and/or kV according to  patient size and/or use of iterative reconstruction technique. COMPARISON:  None FINDINGS: Brain: No evidence of acute infarction, hemorrhage, hydrocephalus, extra-axial collection or mass lesion/mass effect. Vascular: No hyperdense vessel or unexpected calcification. Skull: Normal. Negative for fracture or focal lesion. Sinuses/Orbits: Mild mucosal thickening in the right maxillary sinus. Other: None IMPRESSION: No acute intracranial abnormalities. Electronically Signed   By: Signa Kell M.D.   On: 05/29/2023 20:41   DG Chest Port 1 View Result Date: 05/29/2023 CLINICAL DATA:  Dizziness EXAM: PORTABLE CHEST 1 VIEW COMPARISON:  03/05/2013 FINDINGS: The heart size and mediastinal contours are within normal limits. Both lungs are clear. The visualized skeletal structures are unremarkable. Colonic interposition under the right hemidiaphragm. IMPRESSION: No active disease. Electronically Signed   By: Duanne Guess D.O.   On: 05/29/2023 19:52    Procedures Procedures    Medications Ordered in ED Medications  cefTRIAXone (ROCEPHIN) 1 g in sodium chloride 0.9 % 100 mL IVPB ( Intravenous Restarted 05/29/23 2246)  lactated ringers bolus 1,000 mL (0 mLs Intravenous Stopped 05/29/23 2245)  meclizine (ANTIVERT) tablet 25 mg (25 mg Oral Given 05/29/23 2021)  lactated ringers bolus 1,000 mL (1,000 mLs Intravenous New Bag/Given 05/29/23 2246)  ketorolac  (TORADOL) 15 MG/ML injection 15 mg (15 mg Intravenous Given 05/29/23 2126)  prochlorperazine (COMPAZINE) injection 10 mg (10 mg Intravenous Given 05/29/23 2126)  diphenhydrAMINE (BENADRYL) injection 12.5 mg (12.5 mg Intravenous Given 05/29/23 2125)    ED Course/ Medical Decision Making/ A&P                                 Medical Decision Making Amount and/or Complexity of Data Reviewed Labs: ordered. Radiology: ordered.  Risk Prescription drug management.   This patient presents to the ED for concern of dizziness, this involves an extensive number of treatment options, and is a complaint that carries with it a high risk of complications and morbidity.  The differential diagnosis includes dehydration, polypharmacy, metabolic derangements, anemia, vestibular neuritis, BPPV   Co morbidities that complicate the patient evaluation  bipolar disorder, anxiety, depression, fibromyalgia, rheumatoid arthritis, HTN   Additional history obtained:  Additional history obtained from N/A External records from outside source obtained and reviewed including EMR   Lab Tests:  I Ordered, and personally interpreted labs.  The pertinent results include: Hemoglobin is elevated above baseline suggesting hemoconcentration.  Kidney function and electrolytes are normal.  No leukocytosis is present.   Imaging Studies ordered:  I ordered imaging studies including x-ray, CT head I independently visualized and interpreted imaging which showed no acute findings I agree with the radiologist interpretation   Cardiac Monitoring: / EKG:  The patient was maintained on a cardiac monitor.  I personally viewed and interpreted the cardiac monitored which showed an underlying rhythm of: Sinus rhythm  Problem List / ED Course / Critical interventions / Medication management  Patient presenting for multiple complaints, chief among them is recent dizziness and lightheadedness over the past 3 days, waxing and  waning in severity but seeming to be worse in the evenings.  Vital signs on arrival notable for hypertension and tachycardia.  This has resolved at time of my assessment.  On exam, patient is well-appearing.  She has no focal neurologic deficits.  Abdomen is soft and nontender.  Breathing is unlabored.  She does report that she has had poor fluid intake lately.  Will provide IV fluids for hydration.  Given what she describes  as room spinning sensation, will also trial meclizine.  Will check lab work for any metabolic abnormalities or anemia.  Lab work was notable for increased hemoglobin from baseline suggestive of hemoconcentration in the setting of dehydration.  Additional IV fluids were ordered.  While in the ED, patient endorsed 10/10 severity bifrontal headache.  Of note, CT scan of head was unremarkable.  Headache cocktail was ordered.  Headache improved.  Urinalysis does show evidence of UTI.  She states that she was previously on nitrofurantoin.  Patient was given a dose of ceftriaxone here in the ED and prescribed ongoing Keflex.  She was advised to follow-up on results of urine culture.  She was discharged in stable condition. I ordered medication including IV fluids for hydration; meclizine for dizziness; Compazine, Benadryl, Toradol for headache; ceftriaxone for UTI Reevaluation of the patient after these medicines showed that the patient improved I have reviewed the patients home medicines and have made adjustments as needed   Social Determinants of Health:  Lives independently        Final Clinical Impression(s) / ED Diagnoses Final diagnoses:  Dehydration  Cystitis    Rx / DC Orders ED Discharge Orders          Ordered    cephALEXin (KEFLEX) 500 MG capsule  4 times daily        05/29/23 2220    meclizine (ANTIVERT) 25 MG tablet  3 times daily PRN,   Status:  Discontinued        05/29/23 2259              Gloris Manchester, MD 05/29/23 2300

## 2023-05-29 NOTE — ED Notes (Signed)
Patient transported to CT 

## 2023-05-29 NOTE — ED Triage Notes (Signed)
Pt with dizziness  x 3 days and generalized weakness, states her muscles burn. Possible recent sick contact. pt was taking an antibiotic for possible UTI and did not take the last two doses thinking it was causing the dizziness. Pt states she "feels dazed"

## 2023-05-29 NOTE — Discharge Instructions (Addendum)
Overall, your test results are reassuring.  Your lab work does suggest dehydration.  It also does appear that you still have a UTI.  You received your first dose of an antibiotic for treatment of UTI while in the emergency department.  This will cover you until tomorrow.  A prescription for ongoing antibiotics has been sent to your pharmacy.  Continue to stay hydrated.  Follow-up with your primary care doctor for results of urine culture which should be available in 2 days.  Return to the emergency department for any new or worsening symptoms of concern.

## 2023-06-01 LAB — URINE CULTURE: Culture: 50000 — AB

## 2023-06-02 ENCOUNTER — Telehealth (HOSPITAL_BASED_OUTPATIENT_CLINIC_OR_DEPARTMENT_OTHER): Payer: Self-pay

## 2023-06-02 NOTE — Telephone Encounter (Signed)
Post ED Visit - Positive Culture Follow-up  Culture report reviewed by antimicrobial stewardship pharmacist: [facility] [x]  Clyde [place], [place].D. []  Celedonio Miyamoto, Pharm.D., BCPS AQ-ID []  Garvin Fila, Pharm.D., BCPS []  Georgina Pillion, Pharm.D., BCPS []  [place], [address].D., BCPS, AAHIVP []  Estella Husk, Pharm.D., BCPS, AAHIVP []  Lysle Pearl, PharmD, BCPS []  Phillips Climes, PharmD, BCPS []  Agapito Games, PharmD, BCPS []  Verlan Friends, PharmD []  Mervyn Gay, PharmD, BCPS []  Vinnie Level, PharmD  [facility] []  Len Childs, PharmD []  Greer Pickerel, PharmD []  Adalberto Cole, PharmD []  Perlie Gold, Rph []  Lonell Face) Jean Rosenthal, PharmD []  Earl Many, PharmD []  Junita Push, PharmD []  Dorna Leitz, PharmD []  Terrilee Files, PharmD []  Lynann Beaver, PharmD []  Keturah Barre, PharmD []  Loralee Pacas, PharmD []  Bernadene Person, PharmD   Positive urine culture Treated with Cephalexin and Nitrofurantion, organism sensitive to the same and no further patient follow-up is required at this time.  Sandria Senter 06/02/2023, 8:54 AM

## 2023-11-24 ENCOUNTER — Other Ambulatory Visit: Payer: Self-pay

## 2023-11-24 ENCOUNTER — Emergency Department (HOSPITAL_COMMUNITY)
Admission: EM | Admit: 2023-11-24 | Discharge: 2023-11-24 | Disposition: A | Discharge status: Home / Self Care | Attending: Emergency Medicine | Admitting: Emergency Medicine

## 2023-11-24 DIAGNOSIS — R197 Diarrhea, unspecified: Secondary | ICD-10-CM | POA: Diagnosis not present

## 2023-11-24 DIAGNOSIS — K047 Periapical abscess without sinus: Secondary | ICD-10-CM | POA: Insufficient documentation

## 2023-11-24 DIAGNOSIS — R110 Nausea: Secondary | ICD-10-CM | POA: Insufficient documentation

## 2023-11-24 DIAGNOSIS — I10 Essential (primary) hypertension: Secondary | ICD-10-CM | POA: Diagnosis not present

## 2023-11-24 LAB — URINALYSIS, ROUTINE W REFLEX MICROSCOPIC
Bilirubin Urine: NEGATIVE
Glucose, UA: NEGATIVE mg/dL
Hgb urine dipstick: NEGATIVE
Ketones, ur: NEGATIVE mg/dL
Nitrite: NEGATIVE
Protein, ur: NEGATIVE mg/dL
Specific Gravity, Urine: 1.023 (ref 1.005–1.030)
pH: 5 (ref 5.0–8.0)

## 2023-11-24 MED ORDER — ONDANSETRON HCL 4 MG PO TABS: 4.0000 mg | ORAL_TABLET | Freq: Four times a day (QID) | ORAL | 0 refills | Status: AC

## 2023-11-24 MED ORDER — AMOXICILLIN 500 MG PO CAPS: 500.0000 mg | ORAL_CAPSULE | Freq: Three times a day (TID) | ORAL | 0 refills | Status: DC

## 2023-11-24 NOTE — ED Provider Notes (Signed)
 [place] EMERGENCY DEPARTMENT AT [facility] Provider Note   CSN: [id number] Arrival date & time: 11/24/23  9264     Patient presents with: Abscess and Diarrhea   Brianna Hughes is a 38 y.o. female.    Abscess Diarrhea  This patient is a 38 year old female, states that she has fibromyalgia and hypertension, no other daily medicines or chronic conditions.  She reports that she has been struggling with intermittent dental abscesses in her mouth going on for several months, she was seen by a dentist but the consultation appointment had to be deferred because she got a new job and could not make the appointment.  She continues to have intermittent mild swelling around the teeth in her mouth, she does not feel like there is any facial swelling or fevers, what concerns her is that several times a week for several months she will get nausea vomiting and diarrhea, it then spontaneously resolves.  She is not have any fevers and has no abdominal pain at this time.  She does have some dysuria    Prior to Admission medications   Medication Sig Start Date End Date Taking? Authorizing Provider  amoxicillin  (AMOXIL ) 500 MG capsule Take 1 capsule (500 mg total) by mouth 3 (three) times daily. 11/24/23  Yes Cleotilde Rogue, MD  ondansetron  (ZOFRAN ) 4 MG tablet Take 1 tablet (4 mg total) by mouth every 6 (six) hours. 11/24/23  Yes Cleotilde Rogue, MD  acetaminophen  (TYLENOL ) 500 MG tablet Take 1,000 mg by mouth 2 (two) times daily as needed for moderate pain (pain score 4-6), fever or headache.    [provider]  amitriptyline  (ELAVIL ) 25 MG tablet Take 25 mg by mouth at bedtime.    [provider]  Aspirin-Caffeine (BC FAST PAIN RELIEF PO) Take 1 packet by mouth 2 (two) times daily as needed (pain, headache).    [provider]  cephALEXin  (KEFLEX ) 500 MG capsule Take 1 capsule (500 mg total) by mouth 4 (four) times daily. 05/29/23   Melvenia Motto, MD  clonazePAM   (KLONOPIN ) 0.5 MG tablet Take 0.5 mg by mouth daily as needed for anxiety. 08/21/20   [provider]  gabapentin  (NEURONTIN ) 600 MG tablet Take 600 mg by mouth 2 (two) times daily.    [provider]  nitrofurantoin , macrocrystal-monohydrate, (MACROBID ) 100 MG capsule Take 100 mg by mouth 2 (two) times daily. Patient not taking: Reported on 05/29/2023    [provider]  phentermine (ADIPEX-P) 37.5 MG tablet Take 37.5 mg by mouth daily.    [provider]  sertraline  (ZOLOFT ) 25 MG tablet Take 25 mg by mouth daily.    [provider]  promethazine  (PHENERGAN ) 25 MG tablet Take 1 tablet (25 mg total) by mouth every 6 (six) hours as needed for nausea or vomiting. 01/25/20 04/02/20  Knapp, Iva, MD    Allergies: Hydrocodone , Trileptal [oxcarbazepine], Ultram  [tramadol ], and Adhesive [tape]    Review of Systems  Gastrointestinal:  Positive for diarrhea.  All other systems reviewed and are negative.   Updated Vital Signs BP 127/83   Pulse (!) 51   Temp 97.6 F (36.4 C) (Oral)   Resp 17   Ht 1.651 m (5' 5)   Wt 74.8 kg   LMP 11/16/2023 (Approximate)   SpO2 98%   BMI 27.46 kg/m   Physical Exam Vitals and nursing note reviewed.  Constitutional:      General: She is not in acute distress.    Appearance: She is well-developed.  HENT:     Head: Normocephalic and atraumatic.     Mouth/Throat:     Pharynx: No oropharyngeal exudate.     Comments: Multiple teeth that are eroded to the gumline, there is no gingival swelling, no trismus or torticollis, no changes in phonation, no lymphadenopathy of the neck Eyes:     General: No scleral icterus.       Right eye: No discharge.        Left eye: No discharge.     Conjunctiva/sclera: Conjunctivae normal.     Pupils: Pupils are equal, round, and reactive to light.  Neck:     Thyroid: No thyromegaly.     Vascular: No JVD.  Cardiovascular:     Rate and Rhythm: Normal rate and regular rhythm.      Heart sounds: Normal heart sounds. No murmur heard.    No friction rub. No gallop.  Pulmonary:     Effort: Pulmonary effort is normal. No respiratory distress.     Breath sounds: Normal breath sounds. No wheezing or rales.  Abdominal:     General: Bowel sounds are normal. There is no distension.     Palpations: Abdomen is soft. There is no mass.     Tenderness: There is abdominal tenderness.     Comments: Mild suprapubic tenderness, no other abdominal tenderness, no Murphy sign or pain in McBurney's point  Musculoskeletal:        General: No tenderness. Normal range of motion.     Cervical back: Normal range of motion and neck supple.     Right lower leg: No edema.     Left lower leg: No edema.  Lymphadenopathy:     Cervical: No cervical adenopathy.  Skin:    General: Skin is warm and dry.     Findings: No erythema or rash.  Neurological:     Mental Status: She is alert.     Coordination: Coordination normal.  Psychiatric:        Behavior: Behavior normal.     (all labs ordered are listed, but only abnormal results are displayed) Labs Reviewed  URINALYSIS, ROUTINE W REFLEX MICROSCOPIC - Abnormal; Notable for the following components:      Result Value   APPearance HAZY (*)    Leukocytes,Ua TRACE (*)    Bacteria, UA RARE (*)    All other components within normal limits    EKG: None  Radiology: No results found.   Procedures   Medications Ordered in the ED - No data to display                                  Medical Decision Making Amount and/or Complexity of Data Reviewed Labs: ordered.  Risk Prescription drug management.   Check urinalysis rule out UTI, likely needs antibiotic to cover dental infections and close dental follow-up, the patient is otherwise well-appearing with a nonsurgical abdomen  Labs:  I  personally viewed and interpreted the labs which show urinalysis with no signs of infection  Home with amoxicillin  and Zofran , vitals unremarkable,  no hypertension tachycardia or fever  I have discussed with the patient at the bedside the results, and the meaning of these results.  They have had opportunity to ask questions,  expressed their understanding to the need for follow-up with primary care physician      Final diagnoses:  Dental abscess  Nauseated    ED Discharge Orders  Ordered    ondansetron  (ZOFRAN ) 4 MG tablet  Every 6 hours        11/24/23 0931    amoxicillin  (AMOXIL ) 500 MG capsule  3 times daily        11/24/23 0931               Cleotilde Rogue, MD 11/24/23 [phone]

## 2023-11-24 NOTE — ED Triage Notes (Signed)
 Pt c/o 5 different abscess in her mouth and she feels she has an infection. Pt c/o hot flashes, diarrhea, and vomiting for awhile.

## 2023-11-24 NOTE — Discharge Instructions (Signed)
 Please call your dentist to make a follow-up appointment to be seen within the next week. Your testing shows no signs of urinary infection  Please take Amoxicillin  exactly as prescribed, this is used to treat bacterial infections, it treats a variety of infections including strep throat, ear infections, and a few other bacterial infections.   Do not take this medication if you are allergic to amoxicillin  or penicillin .  effects of medications such as antibiotics include diarrhea which may occur as well as potentially inactivating birth control so if you are using a birth control pill please use an alternative form of birth control for the next 2 weeks.  There is occasions where this antibiotic does not work so if you are not improving within 48 hours you will need to be reevaluated immediately by your doctor or in the emergency department if your symptoms are worsening  Zofran  is a medication which can help with nausea.  You may take 4 mg by mouth every 6 hours as needed if you are an adult, if your child under the age of 6 take half of a tablet or 2 mg every 6 hours as needed.  This should dissolve on your tongue within a short timeframe.  Wait about 30 minutes after taking it to help with drinking clear liquids.

## 2023-11-24 NOTE — ED Notes (Signed)
 EDP at bedside during triage

## 2024-06-06 ENCOUNTER — Emergency Department (HOSPITAL_COMMUNITY)

## 2024-06-06 ENCOUNTER — Encounter (HOSPITAL_COMMUNITY): Payer: Self-pay

## 2024-06-06 ENCOUNTER — Emergency Department (HOSPITAL_COMMUNITY)
Admission: EM | Admit: 2024-06-06 | Discharge: 2024-06-06 | Disposition: A | Discharge status: Home / Self Care | Attending: Emergency Medicine | Admitting: Emergency Medicine

## 2024-06-06 ENCOUNTER — Other Ambulatory Visit: Payer: Self-pay

## 2024-06-06 DIAGNOSIS — K047 Periapical abscess without sinus: Secondary | ICD-10-CM | POA: Insufficient documentation

## 2024-06-06 DIAGNOSIS — R079 Chest pain, unspecified: Secondary | ICD-10-CM | POA: Diagnosis present

## 2024-06-06 DIAGNOSIS — K029 Dental caries, unspecified: Secondary | ICD-10-CM | POA: Diagnosis not present

## 2024-06-06 DIAGNOSIS — I10 Essential (primary) hypertension: Secondary | ICD-10-CM | POA: Insufficient documentation

## 2024-06-06 DIAGNOSIS — F419 Anxiety disorder, unspecified: Secondary | ICD-10-CM | POA: Insufficient documentation

## 2024-06-06 LAB — CBC WITH DIFFERENTIAL/PLATELET
Abs Immature Granulocytes: 0.02 10*3/uL (ref 0.00–0.07)
Basophils Absolute: 0 10*3/uL (ref 0.0–0.1)
Basophils Relative: 1 %
Eosinophils Absolute: 0 10*3/uL (ref 0.0–0.5)
Eosinophils Relative: 1 %
HCT: 40.9 % (ref 36.0–46.0)
Hemoglobin: 13.7 g/dL (ref 12.0–15.0)
Immature Granulocytes: 0 %
Lymphocytes Relative: 16 %
Lymphs Abs: 1.1 10*3/uL (ref 0.7–4.0)
MCH: 29.5 pg (ref 26.0–34.0)
MCHC: 33.5 g/dL (ref 30.0–36.0)
MCV: 88 fL (ref 80.0–100.0)
Monocytes Absolute: 0.3 10*3/uL (ref 0.1–1.0)
Monocytes Relative: 4 %
Neutro Abs: 5.2 10*3/uL (ref 1.7–7.7)
Neutrophils Relative %: 78 %
Platelets: 348 10*3/uL (ref 150–400)
RBC: 4.65 MIL/uL (ref 3.87–5.11)
RDW: 11.8 % (ref 11.5–15.5)
WBC: 6.7 10*3/uL (ref 4.0–10.5)
nRBC: 0 % (ref 0.0–0.2)

## 2024-06-06 LAB — BASIC METABOLIC PANEL WITH GFR
Anion gap: 14 (ref 5–15)
BUN: 11 mg/dL (ref 6–20)
CO2: 22 mmol/L (ref 22–32)
Calcium: 9.3 mg/dL (ref 8.9–10.3)
Chloride: 103 mmol/L (ref 98–111)
Creatinine, Ser: 0.56 mg/dL (ref 0.44–1.00)
GFR, Estimated: >60 mL/min
Glucose, Bld: 135 mg/dL — ABNORMAL HIGH (ref 70–99)
Potassium: 3.4 mmol/L — ABNORMAL LOW (ref 3.5–5.1)
Sodium: 139 mmol/L (ref 135–145)

## 2024-06-06 LAB — TROPONIN T, HIGH SENSITIVITY: Troponin T High Sensitivity: <6 ng/L (ref 0–19)

## 2024-06-06 LAB — HCG, QUANTITATIVE, PREGNANCY: hCG, Beta Chain, Quant, S: <1 m[IU]/mL

## 2024-06-06 MED ORDER — HYDROXYZINE HCL 25 MG PO TABS: 25.0000 mg | ORAL_TABLET | Freq: Three times a day (TID) | ORAL | 0 refills | Status: AC | PRN

## 2024-06-06 MED ORDER — AMOXICILLIN 500 MG PO CAPS: 500.0000 mg | ORAL_CAPSULE | Freq: Three times a day (TID) | ORAL | 0 refills | Status: AC

## 2024-06-06 NOTE — ED Triage Notes (Signed)
 Pt hit her knee on the table in the kitchen about November. Pt stated she just noticed a big knot behind the left knee now that hurts and calf is going numb all the way to hip and toes. Chest pain is a 6 at this time. SHOB, and been nauseous. Possible PE, Pt stated she cannot eat.

## 2024-06-06 NOTE — Discharge Instructions (Signed)
 Keep your appointment with your primary doctor tomorrow as you have scheduled.  Your labs imaging and EKG today are reassuringly normal.  You definitely need an antibiotic for your dental infection which has been prescribed for you.  I encourage you to call your dentist for follow-up care regarding your teeth.  I have also prescribed a small quantity of hydroxyzine  which may help you with your anxiety.
